# Patient Record
Sex: Female | Born: 1950
Health system: Southern US, Community
[De-identification: ages and names within clinical notes are randomized; demographics above are authoritative.]

## PROBLEM LIST (undated history)

## (undated) DIAGNOSIS — E785 Hyperlipidemia, unspecified: Secondary | ICD-10-CM

## (undated) DIAGNOSIS — C801 Malignant (primary) neoplasm, unspecified: Secondary | ICD-10-CM

## (undated) DIAGNOSIS — F32A Depression, unspecified: Secondary | ICD-10-CM

## (undated) DIAGNOSIS — M1990 Unspecified osteoarthritis, unspecified site: Secondary | ICD-10-CM

## (undated) DIAGNOSIS — F329 Major depressive disorder, single episode, unspecified: Secondary | ICD-10-CM

## (undated) DIAGNOSIS — M779 Enthesopathy, unspecified: Secondary | ICD-10-CM

## (undated) DIAGNOSIS — K219 Gastro-esophageal reflux disease without esophagitis: Secondary | ICD-10-CM

## (undated) HISTORY — DX: Major depressive disorder, single episode, unspecified: F32.9

## (undated) HISTORY — DX: Hyperlipidemia, unspecified: E78.5

## (undated) HISTORY — DX: Enthesopathy, unspecified: M77.9

## (undated) HISTORY — DX: Unspecified osteoarthritis, unspecified site: M19.90

## (undated) HISTORY — PX: BREAST SURGERY: SHX581

## (undated) HISTORY — DX: Gastro-esophageal reflux disease without esophagitis: K21.9

## (undated) HISTORY — DX: Depression, unspecified: F32.A

## (undated) HISTORY — PX: ELBOW SURGERY: SHX618

## (undated) HISTORY — DX: Malignant (primary) neoplasm, unspecified: C80.1

---

## 1998-08-26 ENCOUNTER — Ambulatory Visit (HOSPITAL_COMMUNITY): Admission: RE | Admit: 1998-08-26 | Discharge: 1998-08-27 | Payer: Self-pay | Admitting: General Surgery

## 1998-08-26 ENCOUNTER — Encounter: Payer: Self-pay | Admitting: General Surgery

## 1998-09-12 ENCOUNTER — Encounter (HOSPITAL_COMMUNITY): Admission: RE | Admit: 1998-09-12 | Discharge: 1998-12-11 | Payer: Self-pay | Admitting: Dentistry

## 1998-09-12 ENCOUNTER — Ambulatory Visit (HOSPITAL_COMMUNITY): Admission: RE | Admit: 1998-09-12 | Discharge: 1998-09-12 | Payer: Self-pay | Admitting: Hematology & Oncology

## 1998-09-17 ENCOUNTER — Ambulatory Visit (HOSPITAL_COMMUNITY): Admission: RE | Admit: 1998-09-17 | Discharge: 1998-09-17 | Payer: Self-pay | Admitting: Dentistry

## 1998-09-17 ENCOUNTER — Encounter (HOSPITAL_COMMUNITY): Payer: Self-pay | Admitting: Dentistry

## 1998-09-19 ENCOUNTER — Observation Stay (HOSPITAL_COMMUNITY): Admission: AD | Admit: 1998-09-19 | Discharge: 1998-09-20 | Payer: Self-pay | Admitting: Hematology & Oncology

## 1998-11-09 ENCOUNTER — Inpatient Hospital Stay (HOSPITAL_COMMUNITY): Admission: AD | Admit: 1998-11-09 | Discharge: 1998-11-10 | Payer: Self-pay | Admitting: Hematology & Oncology

## 1998-11-21 ENCOUNTER — Inpatient Hospital Stay (HOSPITAL_COMMUNITY): Admission: EM | Admit: 1998-11-21 | Discharge: 1998-11-25 | Payer: Self-pay | Admitting: Hematology & Oncology

## 1998-12-14 HISTORY — PX: BREAST SURGERY: SHX581

## 1998-12-25 ENCOUNTER — Encounter: Payer: Self-pay | Admitting: Hematology & Oncology

## 1998-12-25 ENCOUNTER — Ambulatory Visit (HOSPITAL_COMMUNITY): Admission: RE | Admit: 1998-12-25 | Discharge: 1998-12-25 | Payer: Self-pay | Admitting: Hematology & Oncology

## 1999-06-06 ENCOUNTER — Encounter: Payer: Self-pay | Admitting: Hematology & Oncology

## 1999-06-06 ENCOUNTER — Ambulatory Visit (HOSPITAL_COMMUNITY): Admission: RE | Admit: 1999-06-06 | Discharge: 1999-06-06 | Payer: Self-pay

## 1999-09-30 ENCOUNTER — Other Ambulatory Visit: Admission: RE | Admit: 1999-09-30 | Discharge: 1999-09-30 | Payer: Self-pay | Admitting: Family Medicine

## 2000-10-29 ENCOUNTER — Other Ambulatory Visit: Admission: RE | Admit: 2000-10-29 | Discharge: 2000-10-29 | Payer: Self-pay | Admitting: Family Medicine

## 2001-07-25 ENCOUNTER — Encounter: Payer: Self-pay | Admitting: Hematology & Oncology

## 2001-07-25 ENCOUNTER — Encounter: Admission: RE | Admit: 2001-07-25 | Discharge: 2001-07-25 | Payer: Self-pay | Admitting: Hematology & Oncology

## 2001-09-05 ENCOUNTER — Encounter (INDEPENDENT_AMBULATORY_CARE_PROVIDER_SITE_OTHER): Payer: Self-pay | Admitting: *Deleted

## 2002-03-23 ENCOUNTER — Encounter: Payer: Self-pay | Admitting: Hematology & Oncology

## 2002-03-23 ENCOUNTER — Encounter: Admission: RE | Admit: 2002-03-23 | Discharge: 2002-03-23 | Payer: Self-pay | Admitting: Hematology & Oncology

## 2002-06-19 ENCOUNTER — Encounter: Admission: RE | Admit: 2002-06-19 | Discharge: 2002-06-19 | Payer: Self-pay | Admitting: Hematology & Oncology

## 2002-06-19 ENCOUNTER — Encounter: Payer: Self-pay | Admitting: Hematology & Oncology

## 2003-01-12 ENCOUNTER — Encounter: Admission: RE | Admit: 2003-01-12 | Discharge: 2003-01-12 | Payer: Self-pay | Admitting: Hematology & Oncology

## 2003-01-12 ENCOUNTER — Encounter: Payer: Self-pay | Admitting: Hematology & Oncology

## 2004-04-11 ENCOUNTER — Encounter: Admission: RE | Admit: 2004-04-11 | Discharge: 2004-04-11 | Payer: Self-pay | Admitting: Family Medicine

## 2004-11-28 ENCOUNTER — Ambulatory Visit: Payer: Self-pay | Admitting: Family Medicine

## 2005-01-09 ENCOUNTER — Ambulatory Visit: Payer: Self-pay | Admitting: Hematology & Oncology

## 2005-07-09 ENCOUNTER — Ambulatory Visit: Payer: Self-pay | Admitting: Hematology & Oncology

## 2006-01-07 ENCOUNTER — Ambulatory Visit: Payer: Self-pay | Admitting: Hematology & Oncology

## 2006-04-14 ENCOUNTER — Encounter: Payer: Self-pay | Admitting: General Surgery

## 2006-04-14 ENCOUNTER — Encounter: Payer: Self-pay | Admitting: Hematology & Oncology

## 2006-07-05 ENCOUNTER — Ambulatory Visit: Payer: Self-pay | Admitting: Hematology & Oncology

## 2006-07-09 LAB — CBC WITH DIFFERENTIAL/PLATELET
BASO%: 0.2 % (ref 0.0–2.0)
Basophils Absolute: 0 10*3/uL (ref 0.0–0.1)
Eosinophils Absolute: 0.1 10*3/uL (ref 0.0–0.5)
HCT: 35.9 % (ref 34.8–46.6)
HGB: 12 g/dL (ref 11.6–15.9)
LYMPH%: 29.5 % (ref 14.0–48.0)
MONO#: 0.6 10*3/uL (ref 0.1–0.9)
NEUT#: 3.1 10*3/uL (ref 1.5–6.5)
NEUT%: 58 % (ref 39.6–76.8)
Platelets: 213 10*3/uL (ref 145–400)
WBC: 5.4 10*3/uL (ref 3.9–10.0)
lymph#: 1.6 10*3/uL (ref 0.9–3.3)

## 2006-07-09 LAB — T4: T4, Total: 6.3 ug/dL (ref 5.0–12.5)

## 2006-07-09 LAB — TSH: TSH: 0.94 u[IU]/mL (ref 0.350–5.500)

## 2006-07-09 LAB — COMPREHENSIVE METABOLIC PANEL
ALT: 39 U/L (ref 0–40)
CO2: 27 mEq/L (ref 19–32)
Calcium: 9.2 mg/dL (ref 8.4–10.5)
Chloride: 100 mEq/L (ref 96–112)
Creatinine, Ser: 0.86 mg/dL (ref 0.40–1.20)
Glucose, Bld: 85 mg/dL (ref 70–99)
Total Bilirubin: 0.4 mg/dL (ref 0.3–1.2)

## 2007-01-04 ENCOUNTER — Ambulatory Visit: Payer: Self-pay | Admitting: Hematology & Oncology

## 2007-01-20 LAB — COMPREHENSIVE METABOLIC PANEL
ALT: 34 U/L (ref 0–35)
AST: 23 U/L (ref 0–37)
Albumin: 4.5 g/dL (ref 3.5–5.2)
Alkaline Phosphatase: 97 U/L (ref 39–117)
Potassium: 4.3 mEq/L (ref 3.5–5.3)
Sodium: 140 mEq/L (ref 135–145)
Total Bilirubin: 0.4 mg/dL (ref 0.3–1.2)
Total Protein: 6.8 g/dL (ref 6.0–8.3)

## 2007-01-20 LAB — CBC WITH DIFFERENTIAL/PLATELET
BASO%: 0.2 % (ref 0.0–2.0)
LYMPH%: 23.6 % (ref 14.0–48.0)
MCHC: 34.8 g/dL (ref 32.0–36.0)
MCV: 85.1 fL (ref 81.0–101.0)
MONO#: 0.5 10*3/uL (ref 0.1–0.9)
MONO%: 7.9 % (ref 0.0–13.0)
NEUT#: 4.6 10*3/uL (ref 1.5–6.5)
Platelets: 210 10*3/uL (ref 145–400)
RBC: 4.51 10*6/uL (ref 3.70–5.32)
RDW: 14.4 % (ref 11.3–14.5)
WBC: 6.9 10*3/uL (ref 3.9–10.0)

## 2007-07-20 ENCOUNTER — Ambulatory Visit: Payer: Self-pay | Admitting: Hematology & Oncology

## 2007-07-22 LAB — CBC WITH DIFFERENTIAL/PLATELET
BASO%: 0.3 % (ref 0.0–2.0)
EOS%: 0.9 % (ref 0.0–7.0)
Eosinophils Absolute: 0.1 10*3/uL (ref 0.0–0.5)
LYMPH%: 22.5 % (ref 14.0–48.0)
MCH: 29.5 pg (ref 26.0–34.0)
MCHC: 35.2 g/dL (ref 32.0–36.0)
MCV: 83.8 fL (ref 81.0–101.0)
MONO%: 10 % (ref 0.0–13.0)
Platelets: 187 10*3/uL (ref 145–400)
RBC: 4.2 10*6/uL (ref 3.70–5.32)

## 2007-07-22 LAB — COMPREHENSIVE METABOLIC PANEL
AST: 19 U/L (ref 0–37)
Alkaline Phosphatase: 94 U/L (ref 39–117)
Glucose, Bld: 94 mg/dL (ref 70–99)
Sodium: 140 mEq/L (ref 135–145)
Total Bilirubin: 0.5 mg/dL (ref 0.3–1.2)
Total Protein: 6.4 g/dL (ref 6.0–8.3)

## 2007-08-26 DIAGNOSIS — Z853 Personal history of malignant neoplasm of breast: Secondary | ICD-10-CM

## 2007-08-26 DIAGNOSIS — J309 Allergic rhinitis, unspecified: Secondary | ICD-10-CM | POA: Insufficient documentation

## 2008-01-25 ENCOUNTER — Ambulatory Visit: Payer: Self-pay | Admitting: Hematology & Oncology

## 2008-01-27 LAB — CBC WITH DIFFERENTIAL/PLATELET
BASO%: 0.1 % (ref 0.0–2.0)
Basophils Absolute: 0 10*3/uL (ref 0.0–0.1)
HCT: 39.2 % (ref 34.8–46.6)
HGB: 12.6 g/dL (ref 11.6–15.9)
LYMPH%: 32.1 % (ref 14.0–48.0)
MCHC: 32.2 g/dL (ref 32.0–36.0)
MONO#: 0.5 10*3/uL (ref 0.1–0.9)
NEUT%: 58 % (ref 39.6–76.8)
Platelets: 240 10*3/uL (ref 145–400)
WBC: 6.3 10*3/uL (ref 3.9–10.0)
lymph#: 2 10*3/uL (ref 0.9–3.3)

## 2008-01-27 LAB — COMPREHENSIVE METABOLIC PANEL
ALT: 17 U/L (ref 0–35)
BUN: 9 mg/dL (ref 6–23)
CO2: 29 mEq/L (ref 19–32)
Calcium: 9.6 mg/dL (ref 8.4–10.5)
Chloride: 103 mEq/L (ref 96–112)
Creatinine, Ser: 0.79 mg/dL (ref 0.40–1.20)
Glucose, Bld: 107 mg/dL — ABNORMAL HIGH (ref 70–99)
Total Bilirubin: 0.3 mg/dL (ref 0.3–1.2)

## 2008-04-08 ENCOUNTER — Emergency Department (HOSPITAL_COMMUNITY): Admission: EM | Admit: 2008-04-08 | Discharge: 2008-04-08 | Payer: Self-pay | Admitting: Family Medicine

## 2008-07-18 ENCOUNTER — Ambulatory Visit: Payer: Self-pay | Admitting: Hematology & Oncology

## 2008-07-20 LAB — CBC WITH DIFFERENTIAL (CANCER CENTER ONLY)
BASO%: 0.4 % (ref 0.0–2.0)
LYMPH#: 1.7 10*3/uL (ref 0.9–3.3)
LYMPH%: 32.5 % (ref 14.0–48.0)
MONO#: 0.4 10*3/uL (ref 0.1–0.9)
Platelets: 214 10*3/uL (ref 145–400)
RDW: 12.8 % (ref 10.5–14.6)
WBC: 5.3 10*3/uL (ref 3.9–10.0)

## 2008-07-20 LAB — COMPREHENSIVE METABOLIC PANEL
ALT: 19 U/L (ref 0–35)
AST: 21 U/L (ref 0–37)
BUN: 13 mg/dL (ref 6–23)
Creatinine, Ser: 0.9 mg/dL (ref 0.40–1.20)
Total Bilirubin: 0.4 mg/dL (ref 0.3–1.2)

## 2009-02-01 ENCOUNTER — Ambulatory Visit: Payer: Self-pay | Admitting: Family Medicine

## 2009-02-01 DIAGNOSIS — F418 Other specified anxiety disorders: Secondary | ICD-10-CM | POA: Insufficient documentation

## 2009-02-07 ENCOUNTER — Ambulatory Visit: Payer: Self-pay | Admitting: Hematology & Oncology

## 2009-02-08 LAB — COMPREHENSIVE METABOLIC PANEL
ALT: 24 U/L (ref 0–35)
AST: 21 U/L (ref 0–37)
Albumin: 5 g/dL (ref 3.5–5.2)
Alkaline Phosphatase: 96 U/L (ref 39–117)
Calcium: 9.8 mg/dL (ref 8.4–10.5)
Chloride: 97 mEq/L (ref 96–112)
Potassium: 3.8 mEq/L (ref 3.5–5.3)
Sodium: 135 mEq/L (ref 135–145)

## 2009-02-08 LAB — CBC WITH DIFFERENTIAL (CANCER CENTER ONLY)
BASO%: 0.6 % (ref 0.0–2.0)
EOS%: 2.1 % (ref 0.0–7.0)
LYMPH%: 28 % (ref 14.0–48.0)
MCH: 28.4 pg (ref 26.0–34.0)
MCV: 88 fL (ref 81–101)
MONO%: 6.8 % (ref 0.0–13.0)
Platelets: 221 10*3/uL (ref 145–400)
RDW: 12 % (ref 10.5–14.6)
WBC: 6.5 10*3/uL (ref 3.9–10.0)

## 2009-07-25 ENCOUNTER — Ambulatory Visit: Payer: Self-pay | Admitting: Hematology & Oncology

## 2009-07-26 LAB — COMPREHENSIVE METABOLIC PANEL
Albumin: 4.4 g/dL (ref 3.5–5.2)
BUN: 9 mg/dL (ref 6–23)
CO2: 24 mEq/L (ref 19–32)
Calcium: 9.4 mg/dL (ref 8.4–10.5)
Chloride: 103 mEq/L (ref 96–112)
Glucose, Bld: 86 mg/dL (ref 70–99)
Potassium: 4.1 mEq/L (ref 3.5–5.3)

## 2009-07-26 LAB — CBC WITH DIFFERENTIAL (CANCER CENTER ONLY)
BASO#: 0 10*3/uL (ref 0.0–0.2)
Eosinophils Absolute: 0.1 10*3/uL (ref 0.0–0.5)
LYMPH%: 34 % (ref 14.0–48.0)
MCH: 27.7 pg (ref 26.0–34.0)
MCV: 84 fL (ref 81–101)
MONO%: 6.6 % (ref 0.0–13.0)
Platelets: 205 10*3/uL (ref 145–400)
RBC: 4.4 10*6/uL (ref 3.70–5.32)

## 2009-10-17 ENCOUNTER — Ambulatory Visit: Payer: Self-pay | Admitting: Hematology & Oncology

## 2009-11-19 ENCOUNTER — Ambulatory Visit: Payer: Self-pay | Admitting: Family Medicine

## 2009-11-19 DIAGNOSIS — G43909 Migraine, unspecified, not intractable, without status migrainosus: Secondary | ICD-10-CM

## 2010-01-15 ENCOUNTER — Ambulatory Visit: Payer: Self-pay | Admitting: Hematology & Oncology

## 2010-01-16 LAB — CBC WITH DIFFERENTIAL (CANCER CENTER ONLY)
BASO#: 0 10*3/uL (ref 0.0–0.2)
BASO%: 0.5 % (ref 0.0–2.0)
EOS%: 1.7 % (ref 0.0–7.0)
Eosinophils Absolute: 0.1 10*3/uL (ref 0.0–0.5)
HCT: 40.5 % (ref 34.8–46.6)
HGB: 13.3 g/dL (ref 11.6–15.9)
LYMPH#: 2.1 10*3/uL (ref 0.9–3.3)
LYMPH%: 31.5 % (ref 14.0–48.0)
MCH: 27.8 pg (ref 26.0–34.0)
MCHC: 33 g/dL (ref 32.0–36.0)
MCV: 84 fL (ref 81–101)
MONO#: 0.4 10*3/uL (ref 0.1–0.9)
MONO%: 6.7 % (ref 0.0–13.0)
NEUT#: 3.9 10*3/uL (ref 1.5–6.5)
NEUT%: 59.6 % (ref 39.6–80.0)
Platelets: 232 10*3/uL (ref 145–400)
RBC: 4.8 10*6/uL (ref 3.70–5.32)
RDW: 12.7 % (ref 10.5–14.6)
WBC: 6.6 10*3/uL (ref 3.9–10.0)

## 2010-04-14 ENCOUNTER — Emergency Department (HOSPITAL_COMMUNITY): Admission: EM | Admit: 2010-04-14 | Discharge: 2010-04-14 | Payer: Self-pay | Admitting: Family Medicine

## 2010-07-18 ENCOUNTER — Emergency Department (HOSPITAL_COMMUNITY): Admission: EM | Admit: 2010-07-18 | Discharge: 2010-07-18 | Payer: Self-pay | Admitting: Emergency Medicine

## 2010-07-23 ENCOUNTER — Ambulatory Visit: Payer: Self-pay | Admitting: Hematology & Oncology

## 2010-08-12 ENCOUNTER — Emergency Department (HOSPITAL_COMMUNITY): Admission: EM | Admit: 2010-08-12 | Discharge: 2010-08-12 | Payer: Self-pay | Admitting: Family Medicine

## 2011-01-12 ENCOUNTER — Emergency Department (HOSPITAL_COMMUNITY)
Admission: EM | Admit: 2011-01-12 | Discharge: 2011-01-12 | Payer: Self-pay | Source: Home / Self Care | Admitting: Family Medicine

## 2011-01-12 LAB — POCT URINALYSIS DIPSTICK
Ketones, ur: NEGATIVE mg/dL
Nitrite: NEGATIVE
Protein, ur: NEGATIVE mg/dL
Specific Gravity, Urine: 1.01 (ref 1.005–1.030)
Urine Glucose, Fasting: NEGATIVE mg/dL
Urobilinogen, UA: 0.2 mg/dL (ref 0.0–1.0)
pH: 7.5 (ref 5.0–8.0)

## 2011-02-04 ENCOUNTER — Encounter: Payer: Self-pay | Admitting: Family Medicine

## 2011-02-04 ENCOUNTER — Ambulatory Visit (INDEPENDENT_AMBULATORY_CARE_PROVIDER_SITE_OTHER): Payer: PRIVATE HEALTH INSURANCE | Admitting: Family Medicine

## 2011-02-04 DIAGNOSIS — F329 Major depressive disorder, single episode, unspecified: Secondary | ICD-10-CM

## 2011-02-04 DIAGNOSIS — R0602 Shortness of breath: Secondary | ICD-10-CM

## 2011-02-04 NOTE — Progress Notes (Signed)
  Subjective:    Patient ID: Cindy [name], female    DOB: [birth year], 60 y.o.   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  There is a lot going on at her place of employment.  She is doing it with it and a very positive way by going to the gym 4 nights a week after work.  She states she is sleeping well, however, this morning, when she got up she felt short of breath.  No chest pain, etc., are she is a Surveyor, mining.  She had her follow-up by the oncologist this past summer.  As noted previously.  She had a left lumpectomy with follow-up radiation.  Annual mammography is her normal period.  She is not doing a thorough breast exam monthly.  Encouraged her to check her breasts monthly,,,,,,,,,,,,,The cancer was treated 13 years ago   Review of Systems Cardiopulmonary review of systems negative    Objective:   Physical Exam Well-developed well-nourished, female, in no acute distress   Cardiopulmonary exam negative.  Breast exam.  Scar left breast at 12 o'clock position from previous lumpectomy.  Tattoos from radiation ports.  No palpable masses    Assessment & Plan:  Shortness of breath, secondary to Job stress.  Continue the current medications take the Klonopin one tablet twice daily.  Return one week for follow-up.  Continue your exercise program

## 2011-02-04 NOTE — Patient Instructions (Signed)
Take the Klonopin one tablet twice daily.  Continue your other medications.  Continue your exercise program.  Follow-up in one week

## 2011-02-11 ENCOUNTER — Ambulatory Visit (INDEPENDENT_AMBULATORY_CARE_PROVIDER_SITE_OTHER): Payer: PRIVATE HEALTH INSURANCE | Admitting: Family Medicine

## 2011-02-11 ENCOUNTER — Encounter: Payer: Self-pay | Admitting: Family Medicine

## 2011-02-11 VITALS — BP 120/84 | Temp 98.4°F | Wt 182.0 lb

## 2011-02-11 DIAGNOSIS — F329 Major depressive disorder, single episode, unspecified: Secondary | ICD-10-CM

## 2011-02-11 NOTE — Patient Instructions (Signed)
Continue your current treatment program, except leave work at 530 every day, and go, exercise.  Return in May for general physical exam

## 2011-02-11 NOTE — Progress Notes (Signed)
  Subjective:    Patient ID: Cindy [name], female    DOB: 06-Sep-1951, 60 y.o.   MRN: [id number]  HPI Inita is a 60 year old, married female, nonsmoker, who comes in today for follow-up of work place.  Stress.  We saw her week ago with issues related to stress and chest pain and shortness of breath.  Cardiopulmonary-wise, she was fine.  EKG was normal.  With those symptoms were stress related.  We asked her to continue the Wellbutrin and take her Klonopin p.r.n., which she has done and she feels much better.  She continues to go to the gym 4 x  week.  However, she is not going to 7 o'clock at night and then has difficulty sleeping.  Suggestion leave work at 530   Review of Systems    Negative Objective:   Physical Exam    Well-developed well-nourished, female, in no acute distress.  Thought process organized not tearful    Assessment & Plan:  Depression improved,,,,,,,,,,,, plan continue current treatment program, except leave work at 530 and go exercise

## 2011-02-20 ENCOUNTER — Encounter (HOSPITAL_BASED_OUTPATIENT_CLINIC_OR_DEPARTMENT_OTHER): Payer: PRIVATE HEALTH INSURANCE | Admitting: Hematology & Oncology

## 2011-02-20 DIAGNOSIS — Z853 Personal history of malignant neoplasm of breast: Secondary | ICD-10-CM

## 2011-02-24 ENCOUNTER — Telehealth: Payer: Self-pay | Admitting: *Deleted

## 2011-02-24 MED ORDER — HYDROCODONE-HOMATROPINE 5-1.5 MG/5ML PO SYRP: 2.5000 mL | ORAL_SOLUTION | Freq: Three times a day (TID) | ORAL | Status: AC

## 2011-02-24 NOTE — Telephone Encounter (Signed)
Out of work yesterday and today.  Cough, congestion, aching all over, no fever.  Taking Tylenol and Muccinex. Wants to know what else she can take. Pt does not want to come in for appt requesting advise.  Pharmacy is [facility] [address]

## 2011-02-24 NOTE — Telephone Encounter (Signed)
Hydromet dispense 8 ounces directions one half to 1 teaspoon t.i.d. P.r.n. Cough one refill

## 2011-02-25 ENCOUNTER — Telehealth: Payer: Self-pay | Admitting: *Deleted

## 2011-02-25 ENCOUNTER — Ambulatory Visit: Payer: PRIVATE HEALTH INSURANCE | Admitting: Family Medicine

## 2011-02-25 DIAGNOSIS — R05 Cough: Secondary | ICD-10-CM

## 2011-02-25 NOTE — Telephone Encounter (Signed)
Pt is concerned about pneumonia, and would like a appt and chest xray. Both scheduled, and will see Dr Caryl Never.

## 2011-02-26 ENCOUNTER — Telehealth: Payer: Self-pay | Admitting: Gastroenterology

## 2011-02-26 ENCOUNTER — Ambulatory Visit (INDEPENDENT_AMBULATORY_CARE_PROVIDER_SITE_OTHER)
Admission: RE | Admit: 2011-02-26 | Discharge: 2011-02-26 | Disposition: A | Discharge status: Home / Self Care | Payer: PRIVATE HEALTH INSURANCE | Source: Ambulatory Visit

## 2011-02-26 DIAGNOSIS — R05 Cough: Secondary | ICD-10-CM

## 2011-02-27 ENCOUNTER — Telehealth: Payer: Self-pay | Admitting: Internal Medicine

## 2011-02-27 LAB — POCT URINALYSIS DIPSTICK
Ketones, ur: NEGATIVE mg/dL
Protein, ur: NEGATIVE mg/dL
Urobilinogen, UA: 0.2 mg/dL (ref 0.0–1.0)
pH: 5.5 (ref 5.0–8.0)

## 2011-02-27 NOTE — Telephone Encounter (Signed)
Chest x-ray is normal. Please inform patient

## 2011-02-27 NOTE — Telephone Encounter (Signed)
Pt would like to know her chest xray test results today

## 2011-02-27 NOTE — Telephone Encounter (Signed)
Spoke with pt - informed xr normal

## 2011-03-03 LAB — POCT URINALYSIS DIP (DEVICE)
Glucose, UA: NEGATIVE mg/dL
Nitrite: NEGATIVE
Specific Gravity, Urine: <=1.005 (ref 1.005–1.030)
Urobilinogen, UA: 0.2 mg/dL (ref 0.0–1.0)

## 2011-03-03 LAB — URINE CULTURE

## 2011-03-03 NOTE — Procedures (Signed)
Summary: Colon  Patient Name: Cindy [name], Cindy [name] MRN:  Procedure Procedures: Colonoscopy CPT: [id number].  Personnel: Endoscopist: Venita Lick. Russella Dar, MD, Clementeen Graham.  Referred By: Arlan Organ, MD.  Exam Location: Exam performed in Outpatient Clinic. Outpatient  Patient Consent: Procedure, Alternatives, Risks and Benefits discussed, consent obtained, from patient.  Indications  Increased Risk Screening: Personal history of breast cancer. For family history of colorectal neoplasia, in  first cousin  Comments: First cousin with colon ca at age 17. History  Pre-Exam Physical: Performed Sep 05, 2001. Cardio-pulmonary exam, Rectal exam, HEENT exam , Abdominal exam, Extremity exam, Neurological exam, Mental status exam WNL.  Exam Exam: Extent of exam reached: Cecum, extent intended: Cecum.  The cecum was identified by appendiceal orifice and IC valve. Colon retroflexion performed. ASA Classification: II. Tolerance: good.  Monitoring: Pulse and BP monitoring, Oximetry used. Supplemental O2 given.  Colon Prep Used Visicol for colon prep. Prep results: good.  Sedation Meds: Patient assessed and found to be appropriate for moderate (conscious) sedation. Fentanyl 100 mcg. Versed 10 mg.  Findings NORMAL EXAM: Entire Colon.   Assessment Normal examination.  Events  Unplanned Interventions: No intervention was required.  Unplanned Events: There were no complications. Plans Medication Plan: Continue current medications.  Disposition: After procedure patient sent to recovery. After recovery patient sent home.  Scheduling/Referral: Colonoscopy, to [facility] T. Russella Dar, MD, [facility], around Sep 05, 2006.  Referring provider, to Arlan Organ, MD, Sep 06, 2001.  CC:  Dr. Arlan Organ

## 2011-03-12 NOTE — Progress Notes (Signed)
Summary: Questions  Phone Note Call from Patient Call back at [phone]   Caller: Patient Call For: Dr. Marina Goodell Reason for Call: Talk to Nurse Summary of Call: patient is calling to check on when she is due again for her colon, there are no records in my computer for a recall so she wants to speak with a nurse about this, says that she had a colon at age 60 with Dr. Marina Goodell Initial call taken by: [place] Johnson,  February 26, 2011 2:52 PM  Follow-up for Phone Call        Pulled report and spoke with patient. Patient actually had procedure by Dr. Russella Dar. Informed patient that I would send this note to Dr. Ardell Isaacs nurse for follow-up. Follow-up by: Selinda Michaels RN,  February 26, 2011 4:03 PM  Additional Follow-up for Phone Call Additional follow up Details #1::        Dr Russella Dar please reveiw the colon in her chart and advise colon recall Additional Follow-up by: Darcey Nora RN, CGRN,  February 27, 2011 2:25 PM    Additional Follow-up for Phone Call Additional follow up Details #2::    Colon in [facility] is from 08/2001. Is there another colonoscopy? We recommended a colonoscopy at 5 years, 08/2006, but the current guidelines for a cousin with colon cancer would be 10 years, 08/2011. Follow-up by: Meryl Dare MD Clementeen Graham,  March 01, 2011 12:00 AM  Additional Follow-up for Phone Call Additional follow up Details #3:: Details for Additional Follow-up Action Taken: Checked for another colon report and cannot find one other than from 2002. Left message for patient that she is due in Sept and entered recall in the computer.  Additional Follow-up by: Selinda Michaels RN,  March 02, 2011 8:48 AM

## 2011-04-30 ENCOUNTER — Encounter: Payer: PRIVATE HEALTH INSURANCE | Admitting: Family Medicine

## 2011-09-08 LAB — POCT URINALYSIS DIP (DEVICE)
Bilirubin Urine: NEGATIVE
Ketones, ur: NEGATIVE
Nitrite: NEGATIVE
Operator id: 235561
Protein, ur: NEGATIVE

## 2011-09-08 LAB — URINE CULTURE: Colony Count: 25000

## 2011-09-25 ENCOUNTER — Encounter: Payer: Self-pay | Admitting: Gastroenterology

## 2011-10-05 ENCOUNTER — Telehealth: Payer: Self-pay | Admitting: *Deleted

## 2011-10-05 NOTE — Telephone Encounter (Signed)
patient  Is calling because she had dental work last week.  She was given a rx for amoxicillin.  She is now having some burning when she urinates.  She would like to know if we can call something in?  Work number [phone] if she is not there she would liked to be paged.

## 2011-10-05 NOTE — Telephone Encounter (Signed)
Kimela was treated by the nurse practitioner today at the office.  Her symptoms of the tract infection with burning and frequency.  Culture was done.  She was given Septra DS 10 tablets.  Advised to call me on Thursday with the culture report

## 2011-10-09 ENCOUNTER — Other Ambulatory Visit: Payer: Self-pay | Admitting: *Deleted

## 2011-10-09 MED ORDER — SULFAMETHOXAZOLE-TMP DS 800-160 MG PO TABS: 1.0000 | ORAL_TABLET | Freq: Two times a day (BID) | ORAL | Status: DC

## 2011-10-19 ENCOUNTER — Encounter: Payer: Self-pay | Admitting: Family Medicine

## 2011-10-19 ENCOUNTER — Ambulatory Visit (INDEPENDENT_AMBULATORY_CARE_PROVIDER_SITE_OTHER): Payer: PRIVATE HEALTH INSURANCE | Admitting: Family Medicine

## 2011-10-19 ENCOUNTER — Other Ambulatory Visit (HOSPITAL_COMMUNITY)
Admission: RE | Admit: 2011-10-19 | Discharge: 2011-10-19 | Disposition: A | Discharge status: Home / Self Care | Payer: PRIVATE HEALTH INSURANCE | Source: Ambulatory Visit | Attending: Family Medicine | Admitting: Family Medicine

## 2011-10-19 DIAGNOSIS — Z0000 Encounter for general adult medical examination without abnormal findings: Secondary | ICD-10-CM

## 2011-10-19 DIAGNOSIS — J309 Allergic rhinitis, unspecified: Secondary | ICD-10-CM

## 2011-10-19 DIAGNOSIS — F329 Major depressive disorder, single episode, unspecified: Secondary | ICD-10-CM

## 2011-10-19 DIAGNOSIS — E785 Hyperlipidemia, unspecified: Secondary | ICD-10-CM

## 2011-10-19 DIAGNOSIS — Z853 Personal history of malignant neoplasm of breast: Secondary | ICD-10-CM

## 2011-10-19 DIAGNOSIS — Z01419 Encounter for gynecological examination (general) (routine) without abnormal findings: Secondary | ICD-10-CM | POA: Insufficient documentation

## 2011-10-19 LAB — LIPID PANEL
Cholesterol: 196 mg/dL (ref 0–200)
HDL: 77.7 mg/dL (ref >39.00)
LDL Cholesterol: 109 mg/dL — ABNORMAL HIGH (ref 0–99)
Total CHOL/HDL Ratio: 3
Triglycerides: 49 mg/dL (ref 0.0–149.0)
VLDL: 9.8 mg/dL (ref 0.0–40.0)

## 2011-10-19 LAB — CBC WITH DIFFERENTIAL/PLATELET
Basophils Absolute: 0 10*3/uL (ref 0.0–0.1)
Basophils Relative: 0.4 % (ref 0.0–3.0)
Eosinophils Absolute: 0 10*3/uL (ref 0.0–0.7)
Eosinophils Relative: 0.7 % (ref 0.0–5.0)
HCT: 37.3 % (ref 36.0–46.0)
Hemoglobin: 12.5 g/dL (ref 12.0–15.0)
Lymphocytes Relative: 37.3 % (ref 12.0–46.0)
Lymphs Abs: 2.2 10*3/uL (ref 0.7–4.0)
MCHC: 33.5 g/dL (ref 30.0–36.0)
MCV: 86.3 fl (ref 78.0–100.0)
Monocytes Absolute: 0.5 10*3/uL (ref 0.1–1.0)
Monocytes Relative: 7.9 % (ref 3.0–12.0)
Neutro Abs: 3.1 10*3/uL (ref 1.4–7.7)
Neutrophils Relative %: 53.7 % (ref 43.0–77.0)
Platelets: 192 10*3/uL (ref 150.0–400.0)
RBC: 4.33 Mil/uL (ref 3.87–5.11)
RDW: 15.1 % — ABNORMAL HIGH (ref 11.5–14.6)
WBC: 5.8 10*3/uL (ref 4.5–10.5)

## 2011-10-19 LAB — HEPATIC FUNCTION PANEL
ALT: 24 U/L (ref 0–35)
Albumin: 4.2 g/dL (ref 3.5–5.2)
Total Bilirubin: 0.4 mg/dL (ref 0.3–1.2)

## 2011-10-19 LAB — POCT URINALYSIS DIPSTICK
Bilirubin, UA: NEGATIVE
Glucose, UA: NEGATIVE
Ketones, UA: NEGATIVE
Leukocytes, UA: NEGATIVE
Nitrite, UA: NEGATIVE
pH, UA: 7

## 2011-10-19 LAB — TSH: TSH: 1 u[IU]/mL (ref 0.35–5.50)

## 2011-10-19 MED ORDER — BUPROPION HCL ER (XL) 300 MG PO TB24: 300.0000 mg | ORAL_TABLET | Freq: Every day | ORAL | Status: DC

## 2011-10-19 MED ORDER — TRIAMCINOLONE ACETONIDE 0.5 % EX OINT: TOPICAL_OINTMENT | Freq: Two times a day (BID) | CUTANEOUS | Status: AC

## 2011-10-19 MED ORDER — CLONAZEPAM 1 MG PO TABS: 1.0000 mg | ORAL_TABLET | Freq: Every day | ORAL | Status: DC

## 2011-10-19 MED ORDER — BUPROPION HCL ER (SR) 150 MG PO TB12: 150.0000 mg | ORAL_TABLET | Freq: Every day | ORAL | Status: DC

## 2011-10-19 MED ORDER — PAROXETINE HCL 10 MG PO TABS: 10.0000 mg | ORAL_TABLET | Freq: Every day | ORAL | Status: DC

## 2011-10-19 MED ORDER — LOVASTATIN 20 MG PO TABS: 20.0000 mg | ORAL_TABLET | Freq: Every day | ORAL | Status: DC

## 2011-10-19 NOTE — Patient Instructions (Signed)
Tried small amounts of the udder  Cream In combination with the steroid gel for the skin rash, and wear white cotton gloves at bedtime, and take 25 mg of Benadryl at bedtime.  Also decrease the temperature.  Continue your other medications.  See Dr. Vonna Kotyk.  This fall for an eye exam.  Return in one year or sooner if any problems

## 2011-10-19 NOTE — Progress Notes (Signed)
Subjective:    Patient ID: Cindy [name], female    DOB: 1950/12/26, 60 y.o.   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  She takes Wellbutrin 450 mg a day for mild depression.  She also takes Klonopin 1 mg at bedtime p.r.n. For sleep.  She takes Prevacid 15 mg OTC b.i.d. For reflux esophagitis.  She takes Mevacor 20 mg nightly for hyperlipidemia.  Will check lipid panel today.  She also takes 10 mg of Paxil at that time.  She has a history of, neurodermatitis.  She's been to see the dermatologist and was given a steroid ointment.  However, she continues to scratch and itch.  It seems to be much worse in the winter when it gets dry.  She gets routine eye care.  Recommend Dr. Vonna Kotyk, regular dental care, BSE monthly, annual mammography, colonoscopy, follow-up November 2012, previous bone density, unchanged off of Fosamax.  She takes a small amount of calcium, vitamin D daily.  She sees Dr. Demetrius Charity. I. On a yearly basis for oncology follow-up.  Recently had an E. Coli UTI, which resolved with Septra.   Review of Systems  Constitutional: Negative.   HENT: Negative.   Eyes: Negative.   Respiratory: Negative.   Cardiovascular: Negative.   Gastrointestinal: Negative.   Genitourinary: Negative.   Musculoskeletal: Negative.   Neurological: Negative.   Hematological: Negative.   Psychiatric/Behavioral: Negative.        Objective:   Physical Exam  Constitutional: She appears well-developed and well-nourished.  HENT:  Head: Normocephalic and atraumatic.  Right Ear: External ear normal.  Left Ear: External ear normal.  Nose: Nose normal.  Mouth/Throat: Oropharynx is clear and moist.  Eyes: EOM are normal.  Pupils are equal, round, and reactive to light.  Neck: Normal range of motion. Neck supple. No thyromegaly present.  Cardiovascular: Normal rate, regular rhythm, normal heart sounds and intact distal pulses.  Exam reveals no gallop and no friction rub.   No murmur heard. Pulmonary/Chest: Effort normal and breath sounds normal.  Abdominal: Soft. Bowel sounds are normal. She exhibits no distension and no mass. There is no tenderness. There is no rebound.  Genitourinary: Vagina normal and uterus normal. Guaiac negative stool. No vaginal discharge found.       Bilateral breast exam shows a scar in the 12 o'clock position above the left breast from previous Port-A-Cath.  Also scar right breast 7 o'clock position from previous lumpectomy and tattoos from previous radiation .  No palpable masses.  However, she does have diffuse fibrocystic changes throughout both breasts.  Musculoskeletal: Normal range of motion.  Lymphadenopathy:    She has no cervical adenopathy.  Neurological: She is alert. She has normal reflexes. No cranial nerve deficit. She exhibits normal muscle tone. Coordination normal.  Skin: Skin is warm and dry.       Evidence of multiple areas of scratching and itching  Psychiatric: She has a normal mood and affect. Her behavior is normal. Judgment and thought content normal.          Assessment & Plan:  Healthy female.  History of depression.  Continue current medication.  Reflux esophagitis.  Continue Prevacid 15 mg b.i.d.  History of hyperlipidemia.  Continue Mevacor 20 mg nightly check  lipid panel today.  No dermatitis combination of steroid cream,udder cream and 25 mg of Benadryl at bedtime.  Return in one year or sooner if any problems.

## 2011-10-20 LAB — BASIC METABOLIC PANEL
CO2: 27 mEq/L (ref 19–32)
Chloride: 104 mEq/L (ref 96–112)
Creatinine, Ser: 1 mg/dL (ref 0.4–1.2)
Potassium: 4.5 mEq/L (ref 3.5–5.1)
Sodium: 139 mEq/L (ref 135–145)

## 2011-10-20 NOTE — Progress Notes (Signed)
Addended by: Rita Ohara R on: 10/20/2011 10:25 AM   Modules accepted: Orders

## 2011-10-27 NOTE — Progress Notes (Signed)
Quick Note:  Pt husband informed, mailed labs to pt home ______

## 2011-10-27 NOTE — Progress Notes (Signed)
Quick Note:  Pt husband informed, mailed to home ______

## 2011-10-28 ENCOUNTER — Other Ambulatory Visit: Payer: PRIVATE HEALTH INSURANCE | Admitting: Gastroenterology

## 2011-11-03 ENCOUNTER — Ambulatory Visit (AMBULATORY_SURGERY_CENTER): Payer: PRIVATE HEALTH INSURANCE | Admitting: *Deleted

## 2011-11-03 VITALS — Ht 67.5 in | Wt 185.0 lb

## 2011-11-03 DIAGNOSIS — Z1211 Encounter for screening for malignant neoplasm of colon: Secondary | ICD-10-CM

## 2011-11-03 MED ORDER — PEG-KCL-NACL-NASULF-NA ASC-C 100 G PO SOLR: ORAL | Status: DC

## 2011-11-17 ENCOUNTER — Encounter: Payer: Self-pay | Admitting: Gastroenterology

## 2011-11-17 ENCOUNTER — Ambulatory Visit (AMBULATORY_SURGERY_CENTER): Payer: PRIVATE HEALTH INSURANCE | Admitting: Gastroenterology

## 2011-11-17 VITALS — BP 109/63 | HR 66 | Temp 98.4°F | Resp 18 | Ht 67.0 in | Wt 185.0 lb

## 2011-11-17 DIAGNOSIS — Z1211 Encounter for screening for malignant neoplasm of colon: Secondary | ICD-10-CM

## 2011-11-17 MED ORDER — SODIUM CHLORIDE 0.9 % IV SOLN: 500.0000 mL | INTRAVENOUS | Status: DC

## 2011-11-17 NOTE — Progress Notes (Signed)
Patient did not experience any of the following events: a burn prior to discharge; a fall within the facility; wrong site/side/patient/procedure/implant event; or a hospital transfer or hospital admission upon discharge from the facility. (G8907) Patient did not have preoperative order for IV antibiotic SSI prophylaxis. (G8918)  

## 2011-11-17 NOTE — Patient Instructions (Signed)
Please refer to your blue and neon green sheets for instructions regarding diet and activity for the rest of today.  You may resume your medications as you would normally take them.  

## 2011-11-18 ENCOUNTER — Telehealth: Payer: Self-pay | Admitting: *Deleted

## 2011-11-18 NOTE — Telephone Encounter (Signed)

## 2012-03-22 ENCOUNTER — Telehealth: Payer: Self-pay | Admitting: *Deleted

## 2012-03-22 NOTE — Telephone Encounter (Signed)
Patient is calling to let Dr Tawanna Cooler know that she has referred a couple of co-workers to him.  Also she would like to know if she can have a prescription for an appetite suppressant?

## 2012-04-11 ENCOUNTER — Telehealth: Payer: Self-pay | Admitting: Family Medicine

## 2012-04-11 NOTE — Telephone Encounter (Signed)
Pt states she is having lightheaded and her BP 126/76, has lost 3 lbs in the last few weeks, but is on a diet.  Takes changed pharmacies and some different meds (as generic Wellbutrin).  No fainting spells or reason to come ASAP.

## 2012-04-11 NOTE — Telephone Encounter (Signed)
Patient called stating that she would like to see Dr. Tawanna Cooler tomorrow for light headedness and I called triage to access the issue. Please assist. If patient can not be reached on her cell phone number provided, she can be reached at her desk as follows. [phone]

## 2012-04-12 ENCOUNTER — Encounter: Payer: Self-pay | Admitting: Family Medicine

## 2012-04-12 ENCOUNTER — Ambulatory Visit (INDEPENDENT_AMBULATORY_CARE_PROVIDER_SITE_OTHER): Payer: 59 | Admitting: Family Medicine

## 2012-04-12 VITALS — BP 130/98 | Temp 98.1°F | Wt 188.0 lb

## 2012-04-12 DIAGNOSIS — F329 Major depressive disorder, single episode, unspecified: Secondary | ICD-10-CM

## 2012-04-12 DIAGNOSIS — R42 Dizziness and giddiness: Secondary | ICD-10-CM

## 2012-04-12 DIAGNOSIS — R030 Elevated blood-pressure reading, without diagnosis of hypertension: Secondary | ICD-10-CM

## 2012-04-12 LAB — T4, FREE: Free T4: 0.84 ng/dL (ref 0.60–1.60)

## 2012-04-12 LAB — CBC WITH DIFFERENTIAL/PLATELET
Basophils Absolute: 0 10*3/uL (ref 0.0–0.1)
Basophils Relative: 0.5 % (ref 0.0–3.0)
Eosinophils Relative: 1.3 % (ref 0.0–5.0)
HCT: 40.1 % (ref 36.0–46.0)
Hemoglobin: 13.3 g/dL (ref 12.0–15.0)
Lymphocytes Relative: 37 % (ref 12.0–46.0)
Lymphs Abs: 2.1 10*3/uL (ref 0.7–4.0)
Monocytes Relative: 9.8 % (ref 3.0–12.0)
Neutro Abs: 2.9 10*3/uL (ref 1.4–7.7)
RBC: 4.68 Mil/uL (ref 3.87–5.11)
WBC: 5.6 10*3/uL (ref 4.5–10.5)

## 2012-04-12 LAB — BASIC METABOLIC PANEL
BUN: 12 mg/dL (ref 6–23)
CO2: 29 mEq/L (ref 19–32)
Chloride: 107 mEq/L (ref 96–112)
Creatinine, Ser: 0.8 mg/dL (ref 0.4–1.2)
Glucose, Bld: 92 mg/dL (ref 70–99)

## 2012-04-12 MED ORDER — BUPROPION HCL ER (XL) 300 MG PO TB24: 300.0000 mg | ORAL_TABLET | Freq: Every day | ORAL | Status: DC

## 2012-04-12 NOTE — Patient Instructions (Signed)
We will switch to the previous dose of Wellbutrin to  your previous pharmacy  Check your blood pressure daily in the morning at home  Return in one week with the data and the device  We will call you to more about her thyroid levels

## 2012-04-12 NOTE — Progress Notes (Signed)
  Subjective:    Patient ID: Cindy [name], female    DOB: 1951/08/10, 61 y.o.   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 No true spinning nausea vomiting hearing loss etc. Her symptoms tend to come and go. They're not positional.  Her BP at work was 120/80 BP initially when she came in here today was 130/98 right arm biracial repeat by me 150/100 pulse was 70 and regular positive family history of hypertension  She also recently switched her 300 Wellbutrin to a different manufacturer and wonders if this might be a side effect from the change in her Wellbutrin medication.  She has tapered off the Paxil about 3 months ago  She's also having difficulty with hair loss in both her sisters have hypothyroidism.   Review of Systems Gen. cardiovascular ENT review of systems otherwise negative    Objective:   Physical Exam  Well-developed and nourished female in no acute distress BP right arm sitting position 150/100 pulse 70 and regular neurologic exam normal cerebellar exam in particular normal neck normal thyroid gland not enlarged      Assessment & Plan:  Vertigo reassured  Hypertension question situational BP check daily followup in one week  Hair loss check thyroid levels

## 2012-04-13 ENCOUNTER — Telehealth: Payer: Self-pay | Admitting: Family Medicine

## 2012-04-13 MED ORDER — ONDANSETRON HCL 4 MG PO TABS: ORAL_TABLET | ORAL | Status: DC

## 2012-04-13 NOTE — Telephone Encounter (Signed)
Rx sent to pharmacy. Left message on machine for patient.  

## 2012-04-13 NOTE — Telephone Encounter (Signed)
Pt called and is having nausea with vertigo lite. Pt is req a [facility] med [facility] [place]

## 2012-04-13 NOTE — Telephone Encounter (Signed)
Zofran 4 mg dispensed 10 directions one half tablet twice daily when necessary for nausea refills x2

## 2012-04-14 ENCOUNTER — Telehealth: Payer: Self-pay | Admitting: Family Medicine

## 2012-04-14 NOTE — Telephone Encounter (Signed)
Pt called and said that she is still feeling nauseous and is light headed after taking Zofran. Pt said that she was able to sleep last night and pts bp has come down. Pt req call back from nurse asap today.

## 2012-04-18 NOTE — Telephone Encounter (Signed)
Dr Tawanna Cooler to speak with patient

## 2012-04-19 ENCOUNTER — Encounter: Payer: Self-pay | Admitting: Family Medicine

## 2012-04-19 ENCOUNTER — Ambulatory Visit (INDEPENDENT_AMBULATORY_CARE_PROVIDER_SITE_OTHER): Payer: 59 | Admitting: Family Medicine

## 2012-04-19 VITALS — BP 120/80

## 2012-04-19 DIAGNOSIS — R030 Elevated blood-pressure reading, without diagnosis of hypertension: Secondary | ICD-10-CM

## 2012-04-19 DIAGNOSIS — F329 Major depressive disorder, single episode, unspecified: Secondary | ICD-10-CM

## 2012-04-19 MED ORDER — PAROXETINE HCL 10 MG PO TABS: 10.0000 mg | ORAL_TABLET | Freq: Every day | ORAL | Status: DC

## 2012-04-19 NOTE — Patient Instructions (Signed)
Restart the Paxil 10 mg daily at bedtime return in 4 weeks for followup  Physical exam due November

## 2012-04-19 NOTE — Progress Notes (Signed)
  Subjective:    Patient ID: Cindy [name], female    DOB: [birth year], 61 y.o.   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 She wonders if a lot of her symptoms are related to Paxil withdrawal. This indeed could be the case.  She is on 450 mg of Wellbutrin in the morning and that seems to get her through the day. We discussed various options we'll try restarting the Paxil and see if that doesn't help.   Review of Systems Gen. review of systems negative    Objective:   Physical Exam  Well-developed and nourished female in acute distress BP right arm sitting position 120/80      Assessment & Plan:  Normal blood pressure  History of depression

## 2012-05-10 ENCOUNTER — Ambulatory Visit (INDEPENDENT_AMBULATORY_CARE_PROVIDER_SITE_OTHER): Payer: 59 | Admitting: Family Medicine

## 2012-05-10 DIAGNOSIS — M546 Pain in thoracic spine: Secondary | ICD-10-CM

## 2012-05-10 MED ORDER — MELOXICAM 7.5 MG PO TABS: ORAL_TABLET | ORAL | Status: DC

## 2012-05-10 MED ORDER — CYCLOBENZAPRINE HCL 10 MG PO TABS: 10.0000 mg | ORAL_TABLET | Freq: Every evening | ORAL | Status: AC | PRN
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
## 2012-05-12 ENCOUNTER — Encounter: Payer: Self-pay | Admitting: Family Medicine

## 2012-05-17 ENCOUNTER — Ambulatory Visit: Payer: 59 | Admitting: Family Medicine

## 2012-05-24 ENCOUNTER — Ambulatory Visit (INDEPENDENT_AMBULATORY_CARE_PROVIDER_SITE_OTHER): Payer: 59 | Admitting: Family Medicine

## 2012-05-24 ENCOUNTER — Encounter: Payer: Self-pay | Admitting: Family Medicine

## 2012-05-24 VITALS — BP 120/80 | Temp 98.4°F | Wt 186.0 lb

## 2012-05-24 DIAGNOSIS — F329 Major depressive disorder, single episode, unspecified: Secondary | ICD-10-CM

## 2012-05-24 NOTE — Patient Instructions (Signed)
Continue your current medications  Physical examination when do
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
## 2012-05-26 ENCOUNTER — Telehealth: Payer: Self-pay | Admitting: Family Medicine

## 2012-05-26 DIAGNOSIS — F329 Major depressive disorder, single episode, unspecified: Secondary | ICD-10-CM

## 2012-05-26 MED ORDER — PAROXETINE HCL 10 MG PO TABS: 10.0000 mg | ORAL_TABLET | Freq: Two times a day (BID) | ORAL | Status: DC

## 2012-05-26 NOTE — Telephone Encounter (Signed)
Pt called and said that she does need the Paxil 10 mg 2 times a day. Pls call in to [facility] pharmacy on [facility].

## 2012-05-26 NOTE — Telephone Encounter (Signed)
Rx sent to pharmacy   

## 2012-06-09 ENCOUNTER — Encounter: Payer: Self-pay | Admitting: Hematology & Oncology

## 2012-06-28 ENCOUNTER — Other Ambulatory Visit: Payer: Self-pay | Admitting: *Deleted

## 2012-06-28 DIAGNOSIS — E785 Hyperlipidemia, unspecified: Secondary | ICD-10-CM

## 2012-06-28 MED ORDER — LOVASTATIN 20 MG PO TABS
20.0000 mg | ORAL_TABLET | Freq: Every day | ORAL | Status: DC
Start: 2012-06-28 — End: 2012-10-20

## 2012-10-14 ENCOUNTER — Other Ambulatory Visit (INDEPENDENT_AMBULATORY_CARE_PROVIDER_SITE_OTHER): Payer: 59

## 2012-10-14 DIAGNOSIS — Z0000 Encounter for general adult medical examination without abnormal findings: Secondary | ICD-10-CM

## 2012-10-14 LAB — POCT URINALYSIS DIPSTICK
Bilirubin, UA: NEGATIVE
Glucose, UA: NEGATIVE
Ketones, UA: NEGATIVE
Leukocytes, UA: NEGATIVE
Protein, UA: NEGATIVE

## 2012-10-14 LAB — CBC WITH DIFFERENTIAL/PLATELET
Basophils Relative: 0.3 % (ref 0.0–3.0)
Eosinophils Relative: 1.2 % (ref 0.0–5.0)
HCT: 38.3 % (ref 36.0–46.0)
Hemoglobin: 12.8 g/dL (ref 12.0–15.0)
Lymphs Abs: 1.5 10*3/uL (ref 0.7–4.0)
MCV: 85.7 fl (ref 78.0–100.0)
Monocytes Absolute: 0.4 10*3/uL (ref 0.1–1.0)
Monocytes Relative: 8.2 % (ref 3.0–12.0)
Neutro Abs: 2.4 10*3/uL (ref 1.4–7.7)
WBC: 4.4 10*3/uL — ABNORMAL LOW (ref 4.5–10.5)

## 2012-10-14 LAB — BASIC METABOLIC PANEL
BUN: 11 mg/dL (ref 6–23)
CO2: 28 mEq/L (ref 19–32)
Calcium: 8.8 mg/dL (ref 8.4–10.5)
Creatinine, Ser: 0.8 mg/dL (ref 0.4–1.2)
Glucose, Bld: 91 mg/dL (ref 70–99)

## 2012-10-14 LAB — HEPATIC FUNCTION PANEL
AST: 21 U/L (ref 0–37)
Bilirubin, Direct: 0.1 mg/dL (ref 0.0–0.3)
Total Bilirubin: 0.4 mg/dL (ref 0.3–1.2)

## 2012-10-14 LAB — LIPID PANEL: HDL: 77.7 mg/dL (ref >39.00)

## 2012-10-14 LAB — TSH: TSH: 0.97 u[IU]/mL (ref 0.35–5.50)

## 2012-10-20 ENCOUNTER — Other Ambulatory Visit (HOSPITAL_COMMUNITY)
Admission: RE | Admit: 2012-10-20 | Discharge: 2012-10-20 | Disposition: A | Discharge status: Home / Self Care | Payer: 59 | Source: Ambulatory Visit | Attending: Family Medicine | Admitting: Family Medicine

## 2012-10-20 ENCOUNTER — Encounter: Payer: Self-pay | Admitting: Family Medicine

## 2012-10-20 ENCOUNTER — Ambulatory Visit (INDEPENDENT_AMBULATORY_CARE_PROVIDER_SITE_OTHER): Payer: 59 | Admitting: Family Medicine

## 2012-10-20 VITALS — BP 120/80 | Temp 98.0°F | Ht 67.0 in | Wt 184.0 lb

## 2012-10-20 DIAGNOSIS — Z01419 Encounter for gynecological examination (general) (routine) without abnormal findings: Secondary | ICD-10-CM

## 2012-10-20 DIAGNOSIS — Z853 Personal history of malignant neoplasm of breast: Secondary | ICD-10-CM

## 2012-10-20 DIAGNOSIS — K219 Gastro-esophageal reflux disease without esophagitis: Secondary | ICD-10-CM

## 2012-10-20 DIAGNOSIS — G43909 Migraine, unspecified, not intractable, without status migrainosus: Secondary | ICD-10-CM

## 2012-10-20 DIAGNOSIS — N6009 Solitary cyst of unspecified breast: Secondary | ICD-10-CM

## 2012-10-20 DIAGNOSIS — F329 Major depressive disorder, single episode, unspecified: Secondary | ICD-10-CM

## 2012-10-20 DIAGNOSIS — J309 Allergic rhinitis, unspecified: Secondary | ICD-10-CM

## 2012-10-20 DIAGNOSIS — B009 Herpesviral infection, unspecified: Secondary | ICD-10-CM | POA: Insufficient documentation

## 2012-10-20 DIAGNOSIS — E785 Hyperlipidemia, unspecified: Secondary | ICD-10-CM

## 2012-10-20 MED ORDER — LANSOPRAZOLE 15 MG PO CPDR: 15.0000 mg | DELAYED_RELEASE_CAPSULE | Freq: Two times a day (BID) | ORAL | Status: DC

## 2012-10-20 MED ORDER — PAROXETINE HCL 10 MG PO TABS: 10.0000 mg | ORAL_TABLET | Freq: Two times a day (BID) | ORAL | Status: DC

## 2012-10-20 MED ORDER — LOVASTATIN 20 MG PO TABS: 20.0000 mg | ORAL_TABLET | Freq: Every day | ORAL | Status: DC

## 2012-10-20 MED ORDER — BUPROPION HCL ER (XL) 300 MG PO TB24: 300.0000 mg | ORAL_TABLET | Freq: Every day | ORAL | Status: DC

## 2012-10-20 MED ORDER — CLONAZEPAM 1 MG PO TABS: 1.0000 mg | ORAL_TABLET | Freq: Two times a day (BID) | ORAL | Status: DC | PRN

## 2012-10-20 MED ORDER — BUPROPION HCL ER (SR) 150 MG PO TB12: 150.0000 mg | ORAL_TABLET | Freq: Every day | ORAL | Status: DC

## 2012-10-20 MED ORDER — VALACYCLOVIR HCL 500 MG PO TABS: ORAL_TABLET | ORAL | Status: DC

## 2012-10-20 MED ORDER — TRIAMCINOLONE ACETONIDE 0.025 % EX OINT
TOPICAL_OINTMENT | Freq: Two times a day (BID) | CUTANEOUS | Status: DC
Start: 2012-10-20 — End: 2013-10-19

## 2012-10-20 NOTE — Progress Notes (Signed)
  Subjective:    Patient ID: Cindy [name], female    DOB: [birth year], 61 y.o.   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 She would like a refill on a topical steroid cream. She has a number of bites on her lower extremities appear to be jiggers  She gets routine eye care, dental care, BSE monthly, and you mammography, colonoscopy normal last year, tetanus 2009, seasonal flu shot 2013.   Review of Systems  Constitutional: Negative.   HENT: Negative.   Eyes: Negative.   Respiratory: Negative.   Cardiovascular: Negative.   Gastrointestinal: Negative.   Genitourinary: Negative.   Musculoskeletal: Negative.   Neurological: Negative.   Hematological: Negative.   Psychiatric/Behavioral: Negative.        Objective:   Physical Exam  Constitutional: She appears well-developed and well-nourished.  HENT:  Head: Normocephalic and atraumatic.  Right Ear: External ear normal.  Left Ear: External ear normal.  Nose: Nose normal.  Mouth/Throat: Oropharynx is clear and moist.  Eyes: EOM are normal. Pupils are equal, round, and reactive to light.  Neck: Normal range of motion. Neck supple. No thyromegaly present.  Cardiovascular: Normal rate, regular rhythm, normal heart sounds and intact distal pulses.  Exam reveals no gallop and no friction rub.   No murmur heard. Pulmonary/Chest: Effort normal and breath sounds normal.  Abdominal: Soft. Bowel sounds are normal. She exhibits no distension and no mass.  There is no tenderness. There is no rebound.  Genitourinary: Vagina normal and uterus normal. Guaiac negative stool. No vaginal discharge found.       Bilateral breast exam shows a scar above the left breast where she had a Port-A-Cath and no palpable masses  Right breast shows a scar in the axillary area where she had the sentinel node biopsy scar 10:00 2 inches from the nipple where she had the lumpectomy and tattoos from her radiation there also are a number of fibrocystic changes under the nipple and extending to the 6:00 position below that nipple.  Musculoskeletal: Normal range of motion.  Lymphadenopathy:    She has no cervical adenopathy.  Neurological: She is alert. She has normal reflexes. No cranial nerve deficit. She exhibits normal muscle tone. Coordination normal.  Skin: Skin is warm and dry.  Psychiatric: She has a normal mood and affect. Her behavior is normal. Judgment and thought content normal.          Assessment & Plan:  Healthy female  His history of depression continue Wellbutrin  History of anxiety attacks Klonopin when necessary  Reflux esophagitis continue Prevacid  Hyperlipidemia continue Mevacor  Occasional HSV one Valtrex when necessary  History of breast cancer asymptomatic fibrocystic changes right breast referred back to radiology for followup ultrasound for confirmation

## 2012-10-20 NOTE — Progress Notes (Signed)
  Subjective:    Patient ID: Cindy [name], female    DOB: Aug 10, 1951, 61 y.o.   MRN: [id number]  HPI    Review of Systems     Objective:   Physical Exam        Assessment & Plan:

## 2012-10-20 NOTE — Patient Instructions (Addendum)
Continue your current good health habits  Continue your current medications  We will get you set her for an ultrasound of those fibrocystic lesions in your right breast for confirmation  Return in one year sooner if any problems

## 2012-10-21 ENCOUNTER — Other Ambulatory Visit: Payer: Self-pay | Admitting: *Deleted

## 2012-10-21 MED ORDER — BUPROPION HCL ER (XL) 150 MG PO TB24: 150.0000 mg | ORAL_TABLET | Freq: Every day | ORAL | Status: DC

## 2013-05-03 ENCOUNTER — Other Ambulatory Visit: Payer: Self-pay | Admitting: *Deleted

## 2013-05-03 DIAGNOSIS — F329 Major depressive disorder, single episode, unspecified: Secondary | ICD-10-CM

## 2013-05-03 MED ORDER — CLONAZEPAM 1 MG PO TABS: 1.0000 mg | ORAL_TABLET | Freq: Two times a day (BID) | ORAL | Status: DC | PRN

## 2013-08-18 ENCOUNTER — Other Ambulatory Visit: Payer: Self-pay | Admitting: *Deleted

## 2013-08-18 ENCOUNTER — Telehealth: Payer: Self-pay | Admitting: Family Medicine

## 2013-08-18 MED ORDER — BUPROPION HCL ER (SR) 150 MG PO TB12: 150.0000 mg | ORAL_TABLET | Freq: Three times a day (TID) | ORAL | Status: DC

## 2013-08-18 MED ORDER — BUPROPION HCL ER (XL) 150 MG PO TB24: ORAL_TABLET | ORAL | Status: DC

## 2013-08-18 NOTE — Telephone Encounter (Signed)
Spoke with pharmacy and new Rx sent. 

## 2013-08-18 NOTE — Telephone Encounter (Signed)
Pharmacy calling to discuss the buPROPion [facility] SR) 150 MG 12 hr tablet Please call her back.

## 2013-08-18 NOTE — Telephone Encounter (Signed)
Patient request buproprion 150 three times a day because of cost.

## 2013-08-29 LAB — HM DIABETES EYE EXAM

## 2013-09-04 ENCOUNTER — Encounter: Payer: Self-pay | Admitting: Family Medicine

## 2013-09-11 ENCOUNTER — Telehealth: Payer: Self-pay | Admitting: Family Medicine

## 2013-09-11 NOTE — Telephone Encounter (Signed)
Pt would like orders for CPX labs to be mailed to her, in order for her to take them to [facility] to have them completed. She states that she used to work there and would like to have her labs completed there. Please assist.

## 2013-09-11 NOTE — Telephone Encounter (Signed)
Order faxed to home address

## 2013-10-19 ENCOUNTER — Encounter: Payer: Self-pay | Admitting: Family Medicine

## 2013-10-19 ENCOUNTER — Ambulatory Visit: Payer: 59 | Admitting: Family Medicine

## 2013-10-19 ENCOUNTER — Ambulatory Visit (INDEPENDENT_AMBULATORY_CARE_PROVIDER_SITE_OTHER): Payer: 59 | Admitting: Family Medicine

## 2013-10-19 VITALS — BP 120/80 | Temp 98.4°F | Wt 180.0 lb

## 2013-10-19 DIAGNOSIS — K219 Gastro-esophageal reflux disease without esophagitis: Secondary | ICD-10-CM

## 2013-10-19 DIAGNOSIS — M542 Cervicalgia: Secondary | ICD-10-CM

## 2013-10-19 DIAGNOSIS — E785 Hyperlipidemia, unspecified: Secondary | ICD-10-CM

## 2013-10-19 DIAGNOSIS — F329 Major depressive disorder, single episode, unspecified: Secondary | ICD-10-CM

## 2013-10-19 DIAGNOSIS — B009 Herpesviral infection, unspecified: Secondary | ICD-10-CM

## 2013-10-19 DIAGNOSIS — J309 Allergic rhinitis, unspecified: Secondary | ICD-10-CM

## 2013-10-19 MED ORDER — LOVASTATIN 20 MG PO TABS: 20.0000 mg | ORAL_TABLET | Freq: Every day | ORAL | Status: DC

## 2013-10-19 MED ORDER — TRIAMCINOLONE ACETONIDE 0.025 % EX OINT: TOPICAL_OINTMENT | Freq: Two times a day (BID) | CUTANEOUS | Status: DC

## 2013-10-19 MED ORDER — LANSOPRAZOLE 15 MG PO CPDR: 15.0000 mg | DELAYED_RELEASE_CAPSULE | Freq: Two times a day (BID) | ORAL | Status: DC

## 2013-10-19 MED ORDER — CYCLOBENZAPRINE HCL 10 MG PO TABS: ORAL_TABLET | ORAL | Status: DC

## 2013-10-19 MED ORDER — BUPROPION HCL ER (XL) 150 MG PO TB24: ORAL_TABLET | ORAL | Status: DC

## 2013-10-19 MED ORDER — VALACYCLOVIR HCL 500 MG PO TABS: ORAL_TABLET | ORAL | Status: DC

## 2013-10-19 MED ORDER — PAROXETINE HCL 10 MG PO TABS: ORAL_TABLET | ORAL | Status: DC

## 2013-10-19 NOTE — Patient Instructions (Signed)
Wellbutrin 150 mg............ decrease to 2 tablets daily  Paxil 10 mg........... decrease to one tablet Monday Wednesday Friday  Discontinue the Klonopin  Flexeril 10 mg.................. one half to one tablet at bedtime for neck pain  Soft cervical collar

## 2013-10-19 NOTE — Progress Notes (Signed)
  Subjective:    Patient ID: Cindy [name], female    DOB: 27-May-1951, 62 y.o.   MRN: [id number]  HPI C. 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 who comes in today to discuss her medications and a new problem of right-sided neck pain  She is on Wellbutrin 450 mg daily along with Paxil 10 mg daily and now but she's retired once and no friction taper off some of her medications  She takes Klonopin at bedtime for sleep but it doesn't seem to be helping. She wakes up in the morning with pain in the right side of her neck for the last 3 months. No history of trauma. The acyclovir she takes one daily in the Klonopin she's been taken one each bedtime when necessary 6   Review of Systems Review of systems negative.......Marland Kitchen enjoying her retirement her husband Cindy [name] also retired in January    Objective:   Physical Exam  Well-developed well-nourished female no acute distress vital signs stable she's afebrile BP normal examination neck she has full range of motion some tenderness in the right sternocleidomastoid muscle. Strength sensation reflexes all within normal limits      Assessment & Plan:  Depression plan taper medication  Cervical neck pain soft cervical collar and switched to Flexeril each bedtime DC the Klonopin  Continue your other medications

## 2013-11-27 ENCOUNTER — Encounter: Payer: 59 | Admitting: Family Medicine

## 2013-12-18 ENCOUNTER — Ambulatory Visit (INDEPENDENT_AMBULATORY_CARE_PROVIDER_SITE_OTHER): Payer: 59 | Admitting: Family Medicine

## 2013-12-18 ENCOUNTER — Other Ambulatory Visit (HOSPITAL_COMMUNITY)
Admission: RE | Admit: 2013-12-18 | Discharge: 2013-12-18 | Disposition: A | Discharge status: Home / Self Care | Payer: 59 | Source: Ambulatory Visit | Attending: Family Medicine | Admitting: Family Medicine

## 2013-12-18 ENCOUNTER — Encounter: Payer: Self-pay | Admitting: Family Medicine

## 2013-12-18 VITALS — BP 130/80 | Temp 97.6°F | Ht 67.75 in | Wt 182.0 lb

## 2013-12-18 DIAGNOSIS — Z853 Personal history of malignant neoplasm of breast: Secondary | ICD-10-CM

## 2013-12-18 DIAGNOSIS — F3289 Other specified depressive episodes: Secondary | ICD-10-CM

## 2013-12-18 DIAGNOSIS — Z01419 Encounter for gynecological examination (general) (routine) without abnormal findings: Secondary | ICD-10-CM

## 2013-12-18 DIAGNOSIS — J309 Allergic rhinitis, unspecified: Secondary | ICD-10-CM

## 2013-12-18 DIAGNOSIS — B009 Herpesviral infection, unspecified: Secondary | ICD-10-CM

## 2013-12-18 DIAGNOSIS — E785 Hyperlipidemia, unspecified: Secondary | ICD-10-CM

## 2013-12-18 DIAGNOSIS — F329 Major depressive disorder, single episode, unspecified: Secondary | ICD-10-CM

## 2013-12-18 DIAGNOSIS — G43909 Migraine, unspecified, not intractable, without status migrainosus: Secondary | ICD-10-CM

## 2013-12-18 DIAGNOSIS — K219 Gastro-esophageal reflux disease without esophagitis: Secondary | ICD-10-CM

## 2013-12-18 LAB — POCT URINALYSIS DIPSTICK
BILIRUBIN UA: NEGATIVE
Blood, UA: NEGATIVE
Glucose, UA: NEGATIVE
Ketones, UA: NEGATIVE
Nitrite, UA: NEGATIVE
PH UA: 6.5
Protein, UA: NEGATIVE
Spec Grav, UA: 1.01
Urobilinogen, UA: 0.2

## 2013-12-18 MED ORDER — LOVASTATIN 20 MG PO TABS: 20.0000 mg | ORAL_TABLET | Freq: Every day | ORAL | Status: DC

## 2013-12-18 MED ORDER — PAROXETINE HCL 10 MG PO TABS: ORAL_TABLET | ORAL | Status: DC

## 2013-12-18 MED ORDER — VALACYCLOVIR HCL 500 MG PO TABS: ORAL_TABLET | ORAL | Status: DC

## 2013-12-18 MED ORDER — BUPROPION HCL ER (XL) 150 MG PO TB24: ORAL_TABLET | ORAL | Status: DC

## 2013-12-18 MED ORDER — LANSOPRAZOLE 15 MG PO CPDR: 15.0000 mg | DELAYED_RELEASE_CAPSULE | Freq: Two times a day (BID) | ORAL | Status: DC

## 2013-12-18 NOTE — Progress Notes (Signed)
   Subjective:    Patient ID: Cindy [name], female    DOB: [birth year], 63 y.o.   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  She gets routine eye care, dental care, BSE monthly, and you mammography, colonoscopy and GI,  She says she feels well except she has a lesion on her right labia this been swollen tender and draining pus.  She is retired and is [place] church work but is not exercising on a daily basis  She had a lumpectomy with postop radiation oncology has stopped following her.   Review of Systems  Constitutional: Negative.   HENT: Negative.   Eyes: Negative.   Respiratory: Negative.   Cardiovascular: Negative.   Gastrointestinal: Negative.   Endocrine: Negative.   Genitourinary: Negative.   Musculoskeletal: Negative.   Allergic/Immunologic: Negative.   Neurological: Negative.   Hematological: Negative.   Psychiatric/Behavioral: Negative.        Objective:   Physical Exam  Nursing note and vitals reviewed. Constitutional: She appears well-developed and well-nourished.  HENT:  Head: Normocephalic and atraumatic.  Right Ear: External ear normal.  Left Ear: External ear normal.  Nose: Nose normal.  Mouth/Throat: Oropharynx is clear and moist.  Eyes: EOM are normal. Pupils are equal, round, and reactive to light.  Neck: Normal range of motion. Neck supple. No thyromegaly present.  Cardiovascular: Normal rate, regular rhythm, normal heart sounds and intact distal pulses.  Exam reveals no gallop and no friction rub.   No murmur heard. Pulmonary/Chest: Effort normal and breath sounds normal.  Abdominal: Soft. Bowel sounds  are normal. She exhibits no distension and no mass. There is no tenderness. There is no rebound.  Genitourinary: Vagina normal and uterus normal. Guaiac negative stool. No vaginal discharge found.  Bilateral breast exam normal except for scar left breast at the upper 11:00 position. Tattoos from previous radiation no palpable masses  There is a pea-sized cystic lesion right lateral labia also one above it. The lower one has is red and inflamed but not draining any pus  Musculoskeletal: Normal range of motion.  Lymphadenopathy:    She has no cervical adenopathy.  Neurological: She is alert. She has normal reflexes. No cranial nerve deficit. She exhibits normal muscle tone. Coordination normal.  Skin: Skin is warm and dry.  Psychiatric: She has a normal mood and affect. Her behavior is normal. Judgment and thought content normal.          Assessment & Plan:  Healthy female  History depression continue current medications  Reflux esophagitis continue Prevacid  Hyperlipidemia continue Mevacor and aspirin  Recurrent HSV 1 Doubtrex when necessary  Status post breast cancer  Cystic lesion right labia observe

## 2013-12-18 NOTE — Patient Instructions (Signed)
Continue your current medications  Begin a walking program........... think about joining the [employer]  Followup in 1 year sooner if any problem

## 2013-12-19 LAB — BASIC METABOLIC PANEL
BUN: 15 mg/dL (ref 6–23)
CALCIUM: 9.4 mg/dL (ref 8.4–10.5)
CO2: 28 mEq/L (ref 19–32)
Chloride: 103 mEq/L (ref 96–112)
Creatinine, Ser: 1 mg/dL (ref 0.4–1.2)
GFR: 63.57 mL/min (ref >60.00)
Glucose, Bld: 102 mg/dL — ABNORMAL HIGH (ref 70–99)
POTASSIUM: 4.5 meq/L (ref 3.5–5.1)
Sodium: 140 mEq/L (ref 135–145)

## 2013-12-19 LAB — CBC WITH DIFFERENTIAL/PLATELET
BASOS ABS: 0 10*3/uL (ref 0.0–0.1)
Basophils Relative: 0.4 % (ref 0.0–3.0)
Eosinophils Absolute: 0.1 10*3/uL (ref 0.0–0.7)
Eosinophils Relative: 0.9 % (ref 0.0–5.0)
HEMATOCRIT: 39.4 % (ref 36.0–46.0)
Hemoglobin: 13.1 g/dL (ref 12.0–15.0)
Lymphocytes Relative: 27 % (ref 12.0–46.0)
Lymphs Abs: 2.4 10*3/uL (ref 0.7–4.0)
MCHC: 33.3 g/dL (ref 30.0–36.0)
MCV: 86.5 fl (ref 78.0–100.0)
MONOS PCT: 7.1 % (ref 3.0–12.0)
Monocytes Absolute: 0.6 10*3/uL (ref 0.1–1.0)
Neutro Abs: 5.8 10*3/uL (ref 1.4–7.7)
Neutrophils Relative %: 64.6 % (ref 43.0–77.0)
PLATELETS: 226 10*3/uL (ref 150.0–400.0)
RBC: 4.56 Mil/uL (ref 3.87–5.11)
RDW: 14.9 % — AB (ref 11.5–14.6)
WBC: 9 10*3/uL (ref 4.5–10.5)

## 2013-12-19 LAB — HEPATIC FUNCTION PANEL
ALBUMIN: 4.4 g/dL (ref 3.5–5.2)
ALK PHOS: 102 U/L (ref 39–117)
ALT: 28 U/L (ref 0–35)
AST: 29 U/L (ref 0–37)
BILIRUBIN DIRECT: 0 mg/dL (ref 0.0–0.3)
TOTAL PROTEIN: 7.6 g/dL (ref 6.0–8.3)
Total Bilirubin: 0.4 mg/dL (ref 0.3–1.2)

## 2013-12-19 LAB — LIPID PANEL
CHOL/HDL RATIO: 3
Cholesterol: 217 mg/dL — ABNORMAL HIGH (ref 0–200)
HDL: 70.6 mg/dL (ref >39.00)
Triglycerides: 123 mg/dL (ref 0.0–149.0)
VLDL: 24.6 mg/dL (ref 0.0–40.0)

## 2013-12-19 LAB — LDL CHOLESTEROL, DIRECT: LDL DIRECT: 126.4 mg/dL

## 2013-12-19 LAB — TSH: TSH: 0.73 u[IU]/mL (ref 0.35–5.50)

## 2014-01-08 ENCOUNTER — Emergency Department (HOSPITAL_COMMUNITY)
Admission: EM | Admit: 2014-01-08 | Discharge: 2014-01-08 | Disposition: A | Discharge status: Home / Self Care | Payer: 59 | Source: Home / Self Care

## 2014-01-08 ENCOUNTER — Encounter (HOSPITAL_COMMUNITY): Payer: Self-pay | Admitting: Emergency Medicine

## 2014-01-08 DIAGNOSIS — J111 Influenza due to unidentified influenza virus with other respiratory manifestations: Secondary | ICD-10-CM

## 2014-01-08 MED ORDER — OSELTAMIVIR PHOSPHATE 75 MG PO CAPS: 75.0000 mg | ORAL_CAPSULE | Freq: Two times a day (BID) | ORAL | Status: DC

## 2014-01-08 NOTE — ED Notes (Signed)
Pt c/o cold sxs onset yest Sxs include: productive cough, fevers, BA Denies: v/n/d, SOB, wheezing Took advil today at 0600 and has been taking mucinex w/no relief She is alert w/no signs of acute distress.  

## 2014-01-08 NOTE — ED Provider Notes (Signed)
Medical screening examination/treatment/procedure(s) were performed by non-physician practitioner and as supervising physician I was immediately available for consultation/collaboration.  Philipp Deputy, M.D.   Harden Mo, MD 01/08/14 [phone]

## 2014-01-08 NOTE — Discharge Instructions (Signed)
Influenza, Adult Influenza ("the flu") is a viral infection of the respiratory tract. It occurs more often in winter months because people spend more time in close contact with one another. Influenza can make you feel very sick. Influenza easily spreads from person to person (contagious). CAUSES  Influenza is caused by a virus that infects the respiratory tract. You can catch the virus by breathing in droplets from an infected person's cough or sneeze. You can also catch the virus by touching something that was recently contaminated with the virus and then touching your mouth, nose, or eyes. SYMPTOMS  Symptoms typically last 4 to 10 days and may include:  Fever.  Chills.  Headache, body aches, and muscle aches.  Sore throat.  Chest discomfort and cough.  Poor appetite.  Weakness or feeling tired.  Dizziness.  Nausea or vomiting. DIAGNOSIS  Diagnosis of influenza is often made based on your history and a physical exam. A nose or throat swab test can be done to confirm the diagnosis. RISKS AND COMPLICATIONS You may be at risk for a more severe case of influenza if you smoke cigarettes, have diabetes, have chronic heart disease (such as heart failure) or lung disease (such as asthma), or if you have a weakened immune system. Elderly people and pregnant women are also at risk for more serious infections. The most common complication of influenza is a lung infection (pneumonia). Sometimes, this complication can require emergency medical care and may be life-threatening. PREVENTION  An annual influenza vaccination (flu shot) is the best way to avoid getting influenza. An annual flu shot is now routinely recommended for all adults in the [place] TREATMENT  In mild cases, influenza goes away on its own. Treatment is directed at relieving symptoms. For more severe cases, your caregiver may prescribe antiviral medicines to shorten the sickness. Antibiotic medicines are not effective, because the  infection is caused by a virus, not by bacteria. HOME CARE INSTRUCTIONS  Only take over-the-counter or prescription medicines for pain, discomfort, or fever as directed by your caregiver.  Use a cool mist humidifier to make breathing easier.  Get plenty of rest until your temperature returns to normal. This usually takes 3 to 4 days.  Drink enough fluids to keep your urine clear or pale yellow.  Cover your mouth and nose when coughing or sneezing, and wash your hands well to avoid spreading the virus.  Stay home from work or school until your fever has been gone for at least 1 full day. SEEK MEDICAL CARE IF:   You have chest pain or a deep cough that worsens or produces more mucus.  You have nausea, vomiting, or diarrhea. SEEK IMMEDIATE MEDICAL CARE IF:   You have difficulty breathing, shortness of breath, or your skin or nails turn bluish.  You have severe neck pain or stiffness.  You have a severe headache, facial pain, or earache.  You have a worsening or recurring fever.  You have nausea or vomiting that cannot be controlled. MAKE SURE YOU:  Understand these instructions.  Will watch your condition.  Will get help right away if you are not doing well or get worse. Document Released: 11/27/2000 Document Revised: 05/31/2012 Document Reviewed: 02/29/2012 [facility] Patient Information 2014 [place], [place].  Upper Respiratory Infection, Adult An upper respiratory infection (URI) is also sometimes known as the common cold. The upper respiratory tract includes the nose, sinuses, throat, trachea, and bronchi. Bronchi are the airways leading to the lungs. Most people improve within 1 week, but  symptoms can last up to 2 weeks. A residual cough may last even longer.  CAUSES Many different viruses can infect the tissues lining the upper respiratory tract. The tissues become irritated and inflamed and often become very moist. Mucus production is also common. A cold is contagious. You  can easily spread the virus to others by oral contact. This includes kissing, sharing a glass, coughing, or sneezing. Touching your mouth or nose and then touching a surface, which is then touched by another person, can also spread the virus. SYMPTOMS  Symptoms typically develop 1 to 3 days after you come in contact with a cold virus. Symptoms vary from person to person. They may include:  Runny nose.  Sneezing.  Nasal congestion.  Sinus irritation.  Sore throat.  Loss of voice (laryngitis).  Cough.  Fatigue.  Muscle aches.  Loss of appetite.  Headache.  Low-grade fever. DIAGNOSIS  You might diagnose your own cold based on familiar symptoms, since most people get a cold 2 to 3 times a year. Your caregiver can confirm this based on your exam. Most importantly, your caregiver can check that your symptoms are not due to another disease such as strep throat, sinusitis, pneumonia, asthma, or epiglottitis. Blood tests, throat tests, and X-rays are not necessary to diagnose a common cold, but they may sometimes be helpful in excluding other more serious diseases. Your caregiver will decide if any further tests are required. RISKS AND COMPLICATIONS  You may be at risk for a more severe case of the common cold if you smoke cigarettes, have chronic heart disease (such as heart failure) or lung disease (such as asthma), or if you have a weakened immune system. The very young and very old are also at risk for more serious infections. Bacterial sinusitis, middle ear infections, and bacterial pneumonia can complicate the common cold. The common cold can worsen asthma and chronic obstructive pulmonary disease (COPD). Sometimes, these complications can require emergency medical care and may be life-threatening. PREVENTION  The best way to protect against getting a cold is to practice good hygiene. Avoid oral or hand contact with people with cold symptoms. Wash your hands often if contact occurs.  There is no clear evidence that vitamin C, vitamin E, echinacea, or exercise reduces the chance of developing a cold. However, it is always recommended to get plenty of rest and practice good nutrition. TREATMENT  Treatment is directed at relieving symptoms. There is no cure. Antibiotics are not effective, because the infection is caused by a virus, not by bacteria. Treatment may include:  Increased fluid intake. Sports drinks offer valuable electrolytes, sugars, and fluids.  Breathing heated mist or steam (vaporizer or shower).  Eating chicken soup or other clear broths, and maintaining good nutrition.  Getting plenty of rest.  Using gargles or lozenges for comfort.  Controlling fevers with ibuprofen or acetaminophen as directed by your caregiver.  Increasing usage of your inhaler if you have asthma. Zinc gel and zinc lozenges, taken in the first 24 hours of the common cold, can shorten the duration and lessen the severity of symptoms. Pain medicines may help with fever, muscle aches, and throat pain. A variety of non-prescription medicines are available to treat congestion and runny nose. Your caregiver can make recommendations and may suggest nasal or lung inhalers for other symptoms.  HOME CARE INSTRUCTIONS   Only take over-the-counter or prescription medicines for pain, discomfort, or fever as directed by your caregiver.  Use a warm mist humidifier or inhale  steam from a shower to increase air moisture. This may keep secretions moist and make it easier to breathe.  Drink enough water and fluids to keep your urine clear or pale yellow.  Rest as needed.  Return to work when your temperature has returned to normal or as your caregiver advises. You may need to stay home longer to avoid infecting others. You can also use a face mask and careful hand washing to prevent spread of the virus. SEEK MEDICAL CARE IF:   After the first few days, you feel you are getting worse rather than  better.  You need your caregiver's advice about medicines to control symptoms.  You develop chills, worsening shortness of breath, or brown or red sputum. These may be signs of pneumonia.  You develop yellow or brown nasal discharge or pain in the face, especially when you bend forward. These may be signs of sinusitis.  You develop a fever, swollen neck glands, pain with swallowing, or white areas in the back of your throat. These may be signs of strep throat. SEEK IMMEDIATE MEDICAL CARE IF:   You have a fever.  You develop severe or persistent headache, ear pain, sinus pain, or chest pain.  You develop wheezing, a prolonged cough, cough up blood, or have a change in your usual mucus (if you have chronic lung disease).  You develop sore muscles or a stiff neck. Document Released: 05/26/2001 Document Revised: 02/22/2012 Document Reviewed: 04/03/2011 [facility] Patient Information 2014 [place], [place].

## 2014-01-08 NOTE — ED Provider Notes (Signed)
CSN: [id number]     Arrival date & time 01/08/14  0801 History   None    Chief Complaint  Patient presents with  . URI   (Consider location/radiation/quality/duration/timing/severity/associated sxs/prior Treatment) HPI Comments: 63 year old female and her husband developed acute onset of moderate to severe myalgias last night. They had difficulty sleeping due to the aches and pains and the patient also developed cough, PND and sinus discomfort. She did not take her temperature but felt she may have a low-grade fever. She did have a flu shot this year.   Past Medical History  Diagnosis Date  . Cancer     breast  . Depression   . Arthritis   . GERD (gastroesophageal reflux disease)   . Hyperlipidemia   . Tendonitis    Past Surgical History  Procedure Laterality Date  . Breast surgery      right  . Elbow surgery      right   Family History  Problem Relation Age of Onset  . Cancer Mother     bone  . Hypertension Father   . Diabetes Father   . Stroke Father   . Hypertension Sister   . Alzheimer's disease Brother   . Hypertension Sister    History  Substance Use Topics  . Smoking status: Never Smoker   . Smokeless tobacco: Never Used  . Alcohol Use: Yes     Comment: 1-2 glasses of wine a month   OB History   Grav Para Term Preterm Abortions TAB SAB Ect Mult Living                 Review of Systems  Constitutional: Positive for fever, activity change, appetite change and fatigue. Negative for chills.  HENT: Positive for congestion, postnasal drip and rhinorrhea. Negative for facial swelling and sore throat.   Eyes: Negative.   Respiratory: Positive for cough. Negative for shortness of breath.   Cardiovascular: Negative.   Gastrointestinal: Negative.   Musculoskeletal: Negative for neck pain and neck stiffness.  Skin: Negative for pallor and rash.  Neurological: Negative.     Allergies  Review of patient's allergies indicates no known allergies.  Home  Medications   Current Outpatient Rx  Name  Route  Sig  Dispense  Refill  . buPROPion (WELLBUTRIN XL) 150 MG 24 hr tablet      3 tabs daily   270 tablet   3   . Calcium Carbonate-Vit D-Min (CALCIUM 1200 PO)   Oral   Take by mouth.           . co-enzyme Q-10 30 MG capsule   Oral   Take 30 mg by mouth daily.         . cyclobenzaprine (FLEXERIL) 10 MG tablet      1 by mouth each bedtime   60 tablet   5   . fish oil-omega-3 fatty acids 1000 MG capsule   Oral   Take 1 g by mouth daily.         Marland Kitchen glucosamine-chondroitin 500-400 MG tablet   Oral   Take 1 tablet by mouth daily.           . lansoprazole (PREVACID) 15 MG capsule   Oral   Take 1 capsule (15 mg total) by mouth 2 (two) times daily.   200 capsule   3   . lovastatin (MEVACOR) 20 MG tablet   Oral   Take 1 tablet (20 mg total) by mouth at bedtime.   100  tablet   3   . Multiple Vitamin (MULTIVITAMIN) tablet   Oral   Take 1 tablet by mouth daily.           Marland Kitchen oseltamivir (TAMIFLU) 75 MG capsule   Oral   Take 1 capsule (75 mg total) by mouth every 12 (twelve) hours.   10 capsule   0   . PARoxetine (PAXIL) 10 MG tablet      1 by mouth every morning   100 tablet   3   . triamcinolone (KENALOG) 0.025 % ointment   Topical   Apply topically 2 (two) times daily.   80 g   2   . valACYclovir (VALTREX) 500 MG tablet      1 by mouth daily   100 tablet   3    BP 105/75  Pulse 95  Temp(Src) 98.4 F (36.9 C) (Oral)  Resp 18  SpO2 95% Physical Exam  Nursing note and vitals reviewed. Constitutional: She is oriented to person, place, and time. She appears well-developed and well-nourished. No distress.  HENT:  Mouth/Throat: No oropharyngeal exudate.  Bilateral TMs are normal Oropharynx with moderate erythema, cobblestoning in clear PND  Eyes: Conjunctivae and EOM are normal.  Neck: Normal range of motion. Neck supple.  Cardiovascular: Normal rate and regular rhythm.   Murmur  heard. Pulmonary/Chest: Effort normal and breath sounds normal. No respiratory distress. She has no wheezes. She has no rales.  Abdominal: Soft. There is no tenderness.  Musculoskeletal: Normal range of motion. She exhibits no edema.  Lymphadenopathy:    She has no cervical adenopathy.  Neurological: She is alert and oriented to person, place, and time.  Skin: Skin is warm and dry. No rash noted.  Psychiatric: She has a normal mood and affect.    ED Course  Procedures (including critical care time) Labs Review Labs Reviewed - No data to display Imaging Review No results found.    MDM   1. Influenza      Tamiflu 75 mg twice a day for 5 days Continue the ibuprofen for aches and pains and fever Recommend adding Alka-Seltzer cold plus nighttime relief Continue taking the Mucinex DM. Stay home, rest and drink plenty of fluids.  Janne Napoleon, NP 01/08/14 [phone]

## 2014-01-09 ENCOUNTER — Telehealth: Payer: Self-pay | Admitting: Family Medicine

## 2014-01-09 NOTE — Telephone Encounter (Signed)
Patient Information:  Caller Name: Bri[place]  Phone: [phone]  Patient: Cindy [name]  Gender: Female  DOB: [birth year]  Age: 63 Years  PCP: Stevie Kern [facility])  Office Follow Up:  Does the office need to follow up with this patient?: No  Instructions For The Office: N/A  RN Note:  Patient calling about vomiting which started 01/09/14.  Diagnosed with influenza 01/08/14.  States she has had episodes x 2 since vomiting started.  Per vomiting protocol, emergent symptoms denied; Care measures advised, with callback parameters given.  Per standing orders, Rx for phenergan 25mg , 1 PR q 4h prn nausea/vomiting, #6, NR called in to [facility] [phone].  krs/can  Symptoms  Reason For Call & Symptoms: vomiting  Reviewed Health History In EMR: Yes  Reviewed Medications In EMR: Yes  Reviewed Allergies In EMR: Yes  Reviewed Surgeries / Procedures: Yes  Date of Onset of Symptoms: 01/09/2014  Guideline(s) Used:  Vomiting  Disposition Per Guideline:   Home Care  Reason For Disposition Reached:   Vomiting with diarrhea  Advice Given:  N/A  Patient Will Follow Care Advice:  YES

## 2014-01-10 ENCOUNTER — Ambulatory Visit (INDEPENDENT_AMBULATORY_CARE_PROVIDER_SITE_OTHER): Payer: 59 | Admitting: Family Medicine

## 2014-01-10 ENCOUNTER — Encounter: Payer: Self-pay | Admitting: Family Medicine

## 2014-01-10 ENCOUNTER — Telehealth: Payer: Self-pay | Admitting: Family Medicine

## 2014-01-10 VITALS — BP 120/80 | Temp 98.2°F | Wt 180.0 lb

## 2014-01-10 DIAGNOSIS — K625 Hemorrhage of anus and rectum: Secondary | ICD-10-CM

## 2014-01-10 DIAGNOSIS — B349 Viral infection, unspecified: Secondary | ICD-10-CM

## 2014-01-10 DIAGNOSIS — B9789 Other viral agents as the cause of diseases classified elsewhere: Secondary | ICD-10-CM

## 2014-01-10 MED ORDER — ONDANSETRON HCL 4 MG PO TABS: 4.0000 mg | ORAL_TABLET | Freq: Three times a day (TID) | ORAL | Status: DC | PRN

## 2014-01-10 MED ORDER — HYDROCODONE-HOMATROPINE 5-1.5 MG/5ML PO SYRP: 5.0000 mL | ORAL_SOLUTION | Freq: Three times a day (TID) | ORAL | Status: DC | PRN

## 2014-01-10 NOTE — Telephone Encounter (Signed)
Pt states this am she now has blood in her stool. Wanted to speak w/ dr todd, transferred pt to triage.

## 2014-01-10 NOTE — Telephone Encounter (Signed)
Patient Information:  Caller Name: Elly  Phone: [phone]  Patient: Cindy [name], Cindy [name]  Gender: Female  DOB: Apr 20, 1951  Age: 63 Years  PCP: Stevie Kern [facility])  Office Follow Up:  Does the office need to follow up with this patient?: Yes  Instructions For The Office: Call patient back with further instructions.  RN Note:  Spoke with Curt Bears in office regarding disposition. She requested a note be sent to the office regarding this patient and someone will call her back with instructions.  Symptoms  Reason For Call & Symptoms: Blood in stools; Reports approximately 5 episodes of blood bowel movements. Patient had used a Phenergan suppository before the bleeding started.  Reviewed Health History In EMR: Yes  Reviewed Medications In EMR: Yes  Reviewed Allergies In EMR: Yes  Reviewed Surgeries / Procedures: Yes  Date of Onset of Symptoms: 01/09/2014  Guideline(s) Used:  Diarrhea  Rectal Bleeding  Disposition Per Guideline:   Go to ED Now (or to Office with PCP Approval)  Reason For Disposition Reached:   Bloody, black, or tarry bowel movements  Advice Given:  N/A  Patient Will Follow Care Advice:  YES

## 2014-01-10 NOTE — Progress Notes (Signed)
   Subjective:    Patient ID: Cindy [name], female    DOB: 28-Sep-1951, 63 y.o.   MRN: [id number]  HPI Cindy [name] is a 63 year old married female nonsmoker who comes in today with a viral syndrome and rectal bleeding  She states on Saturday she felt achy slight cough and headache chills scratchy throat no fever and Sunday went to an urgent care and was told she had the flu and was given Tamiflu. On Monday she developed nausea vomiting and diarrhea. He then called her in some Phenergan suppositories. After using a Phenergan suppository she developed bright red rectal bleeding.   Review of Systems Review of systems otherwise negative    Objective:   Physical Exam Well-developed well-nourished female no acute distress vital signs stable she is afebrile  Abdominal exam the abdomen is flat the bowel sounds are normal no tenderness. In the left lateral position inspection the rectum shows a slight tear in the rectal mucosa from probably the Phenergan suppository       Assessment & Plan:  Viral syndrome with side effects from Tamiflu and now [facility] rectal bleeding from Phenergan suppositories plan see orders

## 2014-01-10 NOTE — Patient Instructions (Signed)
Stop the Tamiflu  Rest at home  Drink lots of liquids  Tylenol,,,,,,,,,,,, 2 tabs 3 times daily when necessary  Zofran 4 mg.......... one every 6-8 hours when necessary for nausea and vomiting  Hydromet........Marland Kitchen 1/2-1 teaspoon 3 times daily when necessary for cough and cold symptoms,

## 2014-01-10 NOTE — Telephone Encounter (Signed)
Patient here for office visit

## 2014-01-25 ENCOUNTER — Telehealth: Payer: Self-pay | Admitting: Family Medicine

## 2014-01-25 MED ORDER — SULFAMETHOXAZOLE-TMP DS 800-160 MG PO TABS: 1.0000 | ORAL_TABLET | Freq: Two times a day (BID) | ORAL | Status: DC

## 2014-01-25 NOTE — Telephone Encounter (Signed)
Pt has uti and needs abx call into [facility]. Pt is aware md will call her back today

## 2014-01-25 NOTE — Telephone Encounter (Signed)
Rx sent to pharmacy and patient is aware

## 2014-02-07 ENCOUNTER — Other Ambulatory Visit: Payer: Self-pay | Admitting: Family Medicine

## 2014-08-14 ENCOUNTER — Encounter: Payer: Self-pay | Admitting: Hematology & Oncology

## 2014-09-11 ENCOUNTER — Telehealth: Payer: Self-pay | Admitting: Family Medicine

## 2014-09-11 MED ORDER — CLONAZEPAM 1 MG PO TABS: ORAL_TABLET | ORAL | Status: DC

## 2014-09-11 NOTE — Telephone Encounter (Signed)
rx called into pharmacy

## 2014-09-11 NOTE — Telephone Encounter (Signed)
[facility] - [place], [place] - [id number]. Is requesting re-fill on clonazePAM (KLONOPIN) 1 MG tablet

## 2014-09-17 ENCOUNTER — Ambulatory Visit (INDEPENDENT_AMBULATORY_CARE_PROVIDER_SITE_OTHER): Payer: BC Managed Care – PPO | Admitting: Family Medicine

## 2014-09-17 ENCOUNTER — Encounter: Payer: Self-pay | Admitting: Family Medicine

## 2014-09-17 VITALS — BP 130/90 | Temp 98.0°F | Wt 182.0 lb

## 2014-09-17 DIAGNOSIS — Z23 Encounter for immunization: Secondary | ICD-10-CM

## 2014-09-17 DIAGNOSIS — F418 Other specified anxiety disorders: Secondary | ICD-10-CM

## 2014-09-17 DIAGNOSIS — K602 Anal fissure, unspecified: Secondary | ICD-10-CM

## 2014-09-17 MED ORDER — HYDROCORTISONE 2.5 % RE CREA: TOPICAL_CREAM | RECTAL | Status: DC

## 2014-09-17 NOTE — Patient Instructions (Signed)
Begin a 30 minute exercise program daily  Continue your current medications  If you don't see any improvement in a month call and leave me a voicemail and I will call you back to discuss other options  Warm soaks each bedtime x5-10 minutes,,,,,,, applied a medicated cream at bedtime,,,,,,,, stool softeners daily  If you don't see any improvement in 2-3 weeks call your gastroenterologist for further evaluation

## 2014-09-17 NOTE — Progress Notes (Signed)
   Subjective:    Patient ID: Cindy [name], female    DOB: Nov 21, 1951, 63 y.o.   MRN: [id number]  HPI Cindy [name] is a 63 year old married female nonsmoker who comes in today for discuss 2 issues  She is a 63-year-old married female nonsmoker who comes in today for discuss 2 issues  She is a long-standing history of depression and mild anxiety and is on Klonopin 1 mg at bedtime, Flexeril 10 mg at bedtime, Wellbutrin 450 mg daily, Paxil 10 mg daily. She states that she feels overall fairly well except for 2-3 hours every week she has episodes of anxiety. She describes episodes as feeling anxious rapid heart rate etc.  We discussed various options including continuing medication adding exercise, changing medication, consult with Letta Moynahan  She also has anal fissure that she's been treating at home but doesn't seem to be healing. Her pain is minimal   Review of Systems Review of systems otherwise negative    Objective:   Physical Exam  Well-developed well nourished female no acute distress vital signs stable she is afebrile      Assessment & Plan:  Depression with anxiety........ discussed various options......... Cindy [name] will try to start a 30 minute daily exercise program and continue her current medications. If she doesn't see improvement in her symptoms in 30 days call and we will discuss other options  And no fissure by history........... warm soaks........ stool softener..... Anusol cream.......Marland Kitchen

## 2014-09-17 NOTE — Progress Notes (Signed)
Pre visit review using our clinic review tool, if applicable. No additional management support is needed unless otherwise documented below in the visit note. 

## 2014-10-23 ENCOUNTER — Other Ambulatory Visit: Payer: Self-pay | Admitting: Family Medicine

## 2014-12-06 ENCOUNTER — Other Ambulatory Visit (INDEPENDENT_AMBULATORY_CARE_PROVIDER_SITE_OTHER): Payer: BC Managed Care – PPO

## 2014-12-06 DIAGNOSIS — Z0000 Encounter for general adult medical examination without abnormal findings: Secondary | ICD-10-CM

## 2014-12-06 LAB — BASIC METABOLIC PANEL
BUN: 12 mg/dL (ref 6–23)
CHLORIDE: 105 meq/L (ref 96–112)
CO2: 28 mEq/L (ref 19–32)
Calcium: 9.1 mg/dL (ref 8.4–10.5)
Creatinine, Ser: 0.8 mg/dL (ref 0.4–1.2)
GFR: 75.1 mL/min (ref >60.00)
Glucose, Bld: 89 mg/dL (ref 70–99)
POTASSIUM: 4.2 meq/L (ref 3.5–5.1)
SODIUM: 142 meq/L (ref 135–145)

## 2014-12-06 LAB — CBC WITH DIFFERENTIAL/PLATELET
Basophils Absolute: 0 10*3/uL (ref 0.0–0.1)
Basophils Relative: 0.4 % (ref 0.0–3.0)
Eosinophils Absolute: 0.1 10*3/uL (ref 0.0–0.7)
Eosinophils Relative: 1.4 % (ref 0.0–5.0)
HEMATOCRIT: 38.6 % (ref 36.0–46.0)
HEMOGLOBIN: 12.5 g/dL (ref 12.0–15.0)
LYMPHS ABS: 2.3 10*3/uL (ref 0.7–4.0)
Lymphocytes Relative: 43.3 % (ref 12.0–46.0)
MCHC: 32.4 g/dL (ref 30.0–36.0)
MCV: 87.2 fl (ref 78.0–100.0)
MONO ABS: 0.5 10*3/uL (ref 0.1–1.0)
MONOS PCT: 9.3 % (ref 3.0–12.0)
NEUTROS ABS: 2.4 10*3/uL (ref 1.4–7.7)
Neutrophils Relative %: 45.6 % (ref 43.0–77.0)
PLATELETS: 183 10*3/uL (ref 150.0–400.0)
RBC: 4.43 Mil/uL (ref 3.87–5.11)
RDW: 15.2 % (ref 11.5–15.5)
WBC: 5.2 10*3/uL (ref 4.0–10.5)

## 2014-12-06 LAB — POCT URINALYSIS DIPSTICK
Bilirubin, UA: NEGATIVE
Blood, UA: NEGATIVE
Glucose, UA: NEGATIVE
Ketones, UA: NEGATIVE
NITRITE UA: NEGATIVE
PROTEIN UA: NEGATIVE
Spec Grav, UA: 1.01
UROBILINOGEN UA: 0.2
pH, UA: 7

## 2014-12-06 LAB — HEPATIC FUNCTION PANEL
ALBUMIN: 4.1 g/dL (ref 3.5–5.2)
ALT: 18 U/L (ref 0–35)
AST: 23 U/L (ref 0–37)
Alkaline Phosphatase: 79 U/L (ref 39–117)
BILIRUBIN TOTAL: 0.3 mg/dL (ref 0.2–1.2)
Bilirubin, Direct: 0 mg/dL (ref 0.0–0.3)
Total Protein: 6.8 g/dL (ref 6.0–8.3)

## 2014-12-06 LAB — LIPID PANEL
CHOLESTEROL: 181 mg/dL (ref 0–200)
HDL: 78 mg/dL (ref >39.00)
LDL CALC: 86 mg/dL (ref 0–99)
NonHDL: 103
TRIGLYCERIDES: 87 mg/dL (ref 0.0–149.0)
Total CHOL/HDL Ratio: 2
VLDL: 17.4 mg/dL (ref 0.0–40.0)

## 2014-12-06 LAB — TSH: TSH: 1.44 u[IU]/mL (ref 0.35–4.50)

## 2014-12-13 ENCOUNTER — Encounter: Payer: 59 | Admitting: Family Medicine

## 2014-12-17 ENCOUNTER — Other Ambulatory Visit: Payer: 59

## 2014-12-19 ENCOUNTER — Encounter: Payer: 59 | Admitting: Family Medicine

## 2014-12-24 ENCOUNTER — Encounter: Payer: Self-pay | Admitting: Family Medicine

## 2014-12-24 ENCOUNTER — Other Ambulatory Visit (HOSPITAL_COMMUNITY)
Admission: RE | Admit: 2014-12-24 | Discharge: 2014-12-24 | Disposition: A | Discharge status: Home / Self Care | Payer: 59 | Source: Ambulatory Visit | Attending: Family Medicine | Admitting: Family Medicine

## 2014-12-24 ENCOUNTER — Ambulatory Visit (INDEPENDENT_AMBULATORY_CARE_PROVIDER_SITE_OTHER): Payer: 59 | Admitting: Family Medicine

## 2014-12-24 VITALS — BP 120/80 | Temp 97.7°F | Ht 68.0 in | Wt 182.0 lb

## 2014-12-24 DIAGNOSIS — Z853 Personal history of malignant neoplasm of breast: Secondary | ICD-10-CM

## 2014-12-24 DIAGNOSIS — F330 Major depressive disorder, recurrent, mild: Secondary | ICD-10-CM

## 2014-12-24 DIAGNOSIS — B009 Herpesviral infection, unspecified: Secondary | ICD-10-CM

## 2014-12-24 DIAGNOSIS — K219 Gastro-esophageal reflux disease without esophagitis: Secondary | ICD-10-CM

## 2014-12-24 DIAGNOSIS — E785 Hyperlipidemia, unspecified: Secondary | ICD-10-CM

## 2014-12-24 DIAGNOSIS — Z01419 Encounter for gynecological examination (general) (routine) without abnormal findings: Secondary | ICD-10-CM | POA: Insufficient documentation

## 2014-12-24 DIAGNOSIS — Z1151 Encounter for screening for human papillomavirus (HPV): Secondary | ICD-10-CM | POA: Insufficient documentation

## 2014-12-24 DIAGNOSIS — G43809 Other migraine, not intractable, without status migrainosus: Secondary | ICD-10-CM

## 2014-12-24 DIAGNOSIS — F418 Other specified anxiety disorders: Secondary | ICD-10-CM

## 2014-12-24 DIAGNOSIS — J309 Allergic rhinitis, unspecified: Secondary | ICD-10-CM

## 2014-12-24 DIAGNOSIS — G43709 Chronic migraine without aura, not intractable, without status migrainosus: Secondary | ICD-10-CM

## 2014-12-24 MED ORDER — LANSOPRAZOLE 15 MG PO CPDR: 15.0000 mg | DELAYED_RELEASE_CAPSULE | Freq: Two times a day (BID) | ORAL | Status: DC

## 2014-12-24 MED ORDER — PAROXETINE HCL 10 MG PO TABS: ORAL_TABLET | ORAL | Status: DC

## 2014-12-24 MED ORDER — BUPROPION HCL ER (XL) 150 MG PO TB24: ORAL_TABLET | ORAL | Status: DC

## 2014-12-24 MED ORDER — LOVASTATIN 20 MG PO TABS: 20.0000 mg | ORAL_TABLET | Freq: Every day | ORAL | Status: DC

## 2014-12-24 NOTE — Progress Notes (Signed)
Pre visit review using our clinic review tool, if applicable. No additional management support is needed unless otherwise documented below in the visit note. 

## 2014-12-24 NOTE — Progress Notes (Signed)
   Subjective:    Patient ID: Cindy [name], female    DOB: July 30, 1951, 64 y.o.   MRN: [id number]  HPI    Review of Systems     Objective:   Physical Exam        Assessment & Plan:

## 2014-12-24 NOTE — Addendum Note (Signed)
Addended by: Westley Hummer B on: 12/24/2014 02:55 PM   Modules accepted: Orders

## 2014-12-24 NOTE — Progress Notes (Signed)
Subjective:    Patient ID: Cindy [name], female    DOB: [birth year], 64 y.o.   MRN: [id number]  HPI Cindy [name] is a 64 year old married female nonsmoker retired Theatre stage manager for [employer] who comes in today for general physical examination because of a history of mild depression, sleep dysfunction, reflux esophagitis, mild hyperlipidemia, and a history of breast cancer and migraine headaches  She's had one migraine in the last couple years she would like a refill on the Maxalt  She takes Wellbutrin 450 mg daily along with Klonopin 1 mg at bedtime and Paxil 10 mg daily. Neck combination she seems to be doing well and wishes to continue her medicines  She takes Prevacid 50 mg daily sometimes twice daily for chronic reflux  She takes Mevacor 20 mg daily for hyperlipidemia  She gets routine eye care, dental care, BSE monthly, annual mammography, colonoscopy 2012 normal  She had breast cancer 16 years ago. She had a lesion in her right breast that showed up at mammography. She had a lumpectomy and postop radiation has done well and no recurrence.  Cognitive function normal she walks daily home health safety reviewed no issues identified, no guns in the house, she does have a healthcare power of attorney and living well  Vaccinations updated by Apolonio Schneiders   Review of Systems  Constitutional: Negative.   HENT: Negative.   Eyes: Negative.   Respiratory: Negative.   Cardiovascular: Negative.   Gastrointestinal: Negative.   Endocrine: Negative.   Genitourinary: Negative.   Musculoskeletal: Negative.   Skin: Negative.   Allergic/Immunologic: Negative.   Neurological: Negative.   Hematological: Negative.   Psychiatric/Behavioral: Negative.        Objective:   Physical Exam  Constitutional: She is oriented to person, place, and time. She appears well-developed and well-nourished.  HENT:  Head: Normocephalic and atraumatic.  Right Ear: External ear normal.  Left Ear: External ear  normal.  Nose: Nose normal.  Mouth/Throat: Oropharynx is clear and moist.  Eyes: EOM are normal. Pupils are equal, round, and reactive to light.  Neck: Normal range of motion. Neck supple. No JVD present. No tracheal deviation present. No thyromegaly present.  Cardiovascular: Normal rate, regular rhythm, normal heart sounds and intact distal pulses.  Exam reveals no gallop and no friction rub.   No murmur heard. No carotid neurologic bruits peripheral pulses 2+ and symmetrical  Pulmonary/Chest: Effort normal and breath sounds normal. No stridor. No respiratory distress. She has no wheezes. She has no rales. She exhibits no tenderness.  Abdominal: Soft. Bowel sounds are normal. She exhibits no distension and no mass. There is no tenderness. There is no rebound and no guarding.  Genitourinary: Vagina normal and uterus normal. Guaiac negative stool. No vaginal discharge found.  Pelvic exam normal she does have some dryness in her cervix some spotting with the Pap explained this is not unusual however no other spotting or bleeding during the year  Bilateral breast exam shows a scar right breast 9:00 peripheral also axillary scar from previous lumpectomy and sentinel node biopsy. Also scar left breast at 12:00 from previous biopsy which was benign. No palpable masses  Musculoskeletal: Normal range of motion.  Lymphadenopathy:    She has no cervical adenopathy.  Neurological: She is alert and oriented to person, place, and time. She has normal reflexes. No cranial nerve deficit. She exhibits normal muscle tone. Coordination normal.  Skin: Skin is warm and dry. No rash noted. No erythema. No pallor.  Psychiatric:  She has a normal mood and affect. Her behavior is normal. Judgment and thought content normal.  Nursing note and vitals reviewed.         Assessment & Plan:  Healthy female  Migraine headaches........ diminishing over time..... Refill Maxalt when necessary  Hyperlipidemia.....Marland Kitchen  continue Mevacor  History of depression........ continue current meds  History of reflux esophagitis continue Prevacid 15 mg twice daily  History of breast cancer....... status post lumpectomy with postop radiation 16 years ago

## 2014-12-24 NOTE — Patient Instructions (Signed)
Continue current medications  Follow-up in one year sooner if any problems 

## 2014-12-25 ENCOUNTER — Other Ambulatory Visit: Payer: Self-pay | Admitting: Family Medicine

## 2014-12-25 LAB — CYTOLOGY - PAP

## 2015-01-01 ENCOUNTER — Encounter: Payer: BC Managed Care – PPO | Admitting: Family Medicine

## 2015-01-01 ENCOUNTER — Telehealth: Payer: Self-pay | Admitting: *Deleted

## 2015-01-01 MED ORDER — FLUCONAZOLE 150 MG PO TABS: ORAL_TABLET | ORAL | Status: DC

## 2015-01-01 NOTE — Telephone Encounter (Signed)
Rx sent per Dr Sherren Mocha.

## 2015-01-01 NOTE — Telephone Encounter (Signed)
Patient is requesting a prescription for a yeast infection.  She is having a creamy discharge, redness, and itching. She would like it sent to [facility]

## 2015-01-02 MED ORDER — FLUCONAZOLE 150 MG PO TABS: ORAL_TABLET | ORAL | Status: DC

## 2015-01-02 NOTE — Telephone Encounter (Addendum)
Pt would like this rx fluconazole (DIFLUCAN) 150 MG tablet Sent to the other [facility]  [address]  [facility]  [phone]

## 2015-01-02 NOTE — Addendum Note (Signed)
Addended by: Westley Hummer B on: 01/02/2015 02:54 PM   Modules accepted: Orders

## 2015-02-12 ENCOUNTER — Telehealth: Payer: Self-pay

## 2015-02-12 MED ORDER — OMEPRAZOLE 40 MG PO CPDR: 40.0000 mg | DELAYED_RELEASE_CAPSULE | Freq: Every day | ORAL | Status: DC

## 2015-02-12 NOTE — Telephone Encounter (Signed)
[facility] request to change LANSOPRAZOLE to OMEPRAZOLE, PANTOPRAZOLE or RANEPRAZOLE for insurance purposes.

## 2015-02-13 ENCOUNTER — Ambulatory Visit: Payer: 59 | Admitting: Family Medicine

## 2015-03-13 ENCOUNTER — Telehealth: Payer: Self-pay | Admitting: Family Medicine

## 2015-03-13 NOTE — Telephone Encounter (Signed)
Pt called and ask if you would give her a call.

## 2015-03-13 NOTE — Telephone Encounter (Signed)
Left message on machine for patient returning her call 

## 2015-03-13 NOTE — Telephone Encounter (Signed)
Spoke with patient.

## 2015-04-04 ENCOUNTER — Other Ambulatory Visit: Payer: Self-pay | Admitting: Family Medicine

## 2015-04-05 ENCOUNTER — Telehealth: Payer: Self-pay | Admitting: Family Medicine

## 2015-04-05 MED ORDER — OMEPRAZOLE 40 MG PO CPDR
40.0000 mg | DELAYED_RELEASE_CAPSULE | Freq: Two times a day (BID) | ORAL | Status: DC
Start: 2015-04-05 — End: 2016-02-24

## 2015-04-05 NOTE — Telephone Encounter (Signed)
Pt called to say that she need her record updated that she is taking the following medicine twice a day    omeprazole (PRILOSEC) 40 MG capsule

## 2015-04-05 NOTE — Telephone Encounter (Signed)
Medication was updated in pt's chart.

## 2015-05-27 ENCOUNTER — Telehealth: Payer: Self-pay

## 2015-05-27 NOTE — Telephone Encounter (Signed)
Not due until 05/28/16

## 2015-06-03 LAB — HM MAMMOGRAPHY

## 2015-06-04 ENCOUNTER — Encounter: Payer: Self-pay | Admitting: Family Medicine

## 2015-07-08 ENCOUNTER — Other Ambulatory Visit: Payer: Self-pay | Admitting: Family Medicine

## 2015-10-11 ENCOUNTER — Ambulatory Visit (INDEPENDENT_AMBULATORY_CARE_PROVIDER_SITE_OTHER): Payer: 59 | Admitting: *Deleted

## 2015-10-11 DIAGNOSIS — Z23 Encounter for immunization: Secondary | ICD-10-CM

## 2015-10-29 ENCOUNTER — Other Ambulatory Visit: Payer: Self-pay | Admitting: Family Medicine

## 2016-01-07 ENCOUNTER — Other Ambulatory Visit: Payer: Self-pay | Admitting: Family Medicine

## 2016-01-07 MED FILL — PARoxetine HCL 10 MG TABS: 10 | 90 days supply | Qty: 90 | Fill #0

## 2016-01-07 MED FILL — LOVASTATIN 20 MG TABLET: 20 | 90 days supply | Qty: 90 | Fill #0

## 2016-01-07 MED FILL — clonazePAM 1 MG TABS: 1 | 30 days supply | Qty: 60 | Fill #2

## 2016-01-07 MED FILL — OMEPRAZOLE DR 40 MG CAPSULE: 40 | 90 days supply | Qty: 90 | Fill #5

## 2016-01-07 MED FILL — BUPROPION HCL XL 150 MG TAB: 150 | 90 days supply | Qty: 270 | Fill #0

## 2016-02-17 ENCOUNTER — Other Ambulatory Visit (INDEPENDENT_AMBULATORY_CARE_PROVIDER_SITE_OTHER): Payer: BLUE CROSS/BLUE SHIELD

## 2016-02-17 DIAGNOSIS — Z0000 Encounter for general adult medical examination without abnormal findings: Secondary | ICD-10-CM | POA: Diagnosis not present

## 2016-02-17 LAB — CBC WITH DIFFERENTIAL/PLATELET
Basophils Absolute: 0 10*3/uL (ref 0.0–0.1)
Basophils Relative: 0.4 % (ref 0.0–3.0)
Eosinophils Absolute: 0.1 10*3/uL (ref 0.0–0.7)
Eosinophils Relative: 1 % (ref 0.0–5.0)
HCT: 38 % (ref 36.0–46.0)
Hemoglobin: 12.8 g/dL (ref 12.0–15.0)
Lymphocytes Relative: 35.3 % (ref 12.0–46.0)
Lymphs Abs: 2 10*3/uL (ref 0.7–4.0)
MCHC: 33.8 g/dL (ref 30.0–36.0)
MCV: 84.6 fl (ref 78.0–100.0)
Monocytes Absolute: 0.4 10*3/uL (ref 0.1–1.0)
Monocytes Relative: 7.1 % (ref 3.0–12.0)
Neutro Abs: 3.2 10*3/uL (ref 1.4–7.7)
Neutrophils Relative %: 56.2 % (ref 43.0–77.0)
Platelets: 193 10*3/uL (ref 150.0–400.0)
RBC: 4.5 Mil/uL (ref 3.87–5.11)
RDW: 15.1 % (ref 11.5–15.5)
WBC: 5.7 10*3/uL (ref 4.0–10.5)

## 2016-02-17 LAB — BASIC METABOLIC PANEL
BUN: 9 mg/dL (ref 6–23)
CALCIUM: 9.2 mg/dL (ref 8.4–10.5)
CO2: 29 mEq/L (ref 19–32)
CREATININE: 0.75 mg/dL (ref 0.40–1.20)
Chloride: 104 mEq/L (ref 96–112)
GFR: 82.92 mL/min (ref >60.00)
GLUCOSE: 90 mg/dL (ref 70–99)
Potassium: 3.9 mEq/L (ref 3.5–5.1)
Sodium: 141 mEq/L (ref 135–145)

## 2016-02-17 LAB — HEPATIC FUNCTION PANEL
ALT: 18 U/L (ref 0–35)
AST: 17 U/L (ref 0–37)
Albumin: 4.3 g/dL (ref 3.5–5.2)
Alkaline Phosphatase: 83 U/L (ref 39–117)
Bilirubin, Direct: 0.1 mg/dL (ref 0.0–0.3)
Total Bilirubin: 0.5 mg/dL (ref 0.2–1.2)
Total Protein: 6.4 g/dL (ref 6.0–8.3)

## 2016-02-17 LAB — LIPID PANEL
CHOL/HDL RATIO: 2
CHOLESTEROL: 186 mg/dL (ref 0–200)
HDL: 77.9 mg/dL (ref >39.00)
LDL CALC: 86 mg/dL (ref 0–99)
NonHDL: 107.88
TRIGLYCERIDES: 107 mg/dL (ref 0.0–149.0)
VLDL: 21.4 mg/dL (ref 0.0–40.0)

## 2016-02-17 LAB — POC URINALSYSI DIPSTICK (AUTOMATED)
BILIRUBIN UA: NEGATIVE
Blood, UA: NEGATIVE
GLUCOSE UA: NEGATIVE
Ketones, UA: NEGATIVE
NITRITE UA: NEGATIVE
Protein, UA: NEGATIVE
Spec Grav, UA: 1.015
UROBILINOGEN UA: 1
pH, UA: 7.5

## 2016-02-17 LAB — TSH: TSH: 0.77 u[IU]/mL (ref 0.35–4.50)

## 2016-02-18 ENCOUNTER — Other Ambulatory Visit: Payer: Self-pay | Admitting: Family Medicine

## 2016-02-18 MED FILL — VALACYCLOVIR HCL 500 MG TAB: 500 | 90 days supply | Qty: 90 | Fill #0 | Status: TO

## 2016-02-24 ENCOUNTER — Encounter: Payer: Self-pay | Admitting: Family Medicine

## 2016-02-24 ENCOUNTER — Ambulatory Visit (INDEPENDENT_AMBULATORY_CARE_PROVIDER_SITE_OTHER): Payer: BLUE CROSS/BLUE SHIELD | Admitting: Family Medicine

## 2016-02-24 VITALS — BP 120/80 | Temp 98.5°F | Ht 68.0 in | Wt 178.0 lb

## 2016-02-24 DIAGNOSIS — Z853 Personal history of malignant neoplasm of breast: Secondary | ICD-10-CM | POA: Diagnosis not present

## 2016-02-24 DIAGNOSIS — Z0000 Encounter for general adult medical examination without abnormal findings: Secondary | ICD-10-CM

## 2016-02-24 DIAGNOSIS — F418 Other specified anxiety disorders: Secondary | ICD-10-CM | POA: Diagnosis not present

## 2016-02-24 DIAGNOSIS — E785 Hyperlipidemia, unspecified: Secondary | ICD-10-CM

## 2016-02-24 DIAGNOSIS — K219 Gastro-esophageal reflux disease without esophagitis: Secondary | ICD-10-CM

## 2016-02-24 MED ORDER — PAROXETINE HCL 10 MG PO TABS: 10.0000 mg | ORAL_TABLET | Freq: Every morning | ORAL | Status: DC

## 2016-02-24 MED ORDER — VALACYCLOVIR HCL 500 MG PO TABS: 500.0000 mg | ORAL_TABLET | Freq: Every day | ORAL | Status: DC

## 2016-02-24 MED ORDER — OMEPRAZOLE 40 MG PO CPDR: 40.0000 mg | DELAYED_RELEASE_CAPSULE | Freq: Every day | ORAL | Status: DC

## 2016-02-24 MED ORDER — BUPROPION HCL ER (XL) 150 MG PO TB24: 450.0000 mg | ORAL_TABLET | Freq: Every day | ORAL | Status: DC

## 2016-02-24 MED ORDER — LOVASTATIN 20 MG PO TABS: 20.0000 mg | ORAL_TABLET | Freq: Every day | ORAL | Status: DC

## 2016-02-24 MED ORDER — CLONAZEPAM 1 MG PO TABS: 1.0000 mg | ORAL_TABLET | Freq: Two times a day (BID) | ORAL | Status: DC

## 2016-02-24 MED FILL — clonazePAM 1 MG TABS: 1 | 30 days supply | Qty: 60 | Fill #0

## 2016-02-24 NOTE — Progress Notes (Signed)
Pre visit review using our clinic review tool, if applicable. No additional management support is needed unless otherwise documented below in the visit note. 

## 2016-02-24 NOTE — Patient Instructions (Signed)
Continue current medications  Continue daily exercise  Return in one year sooner if any problems  Rachel's extension is [phone]

## 2016-02-24 NOTE — Progress Notes (Signed)
   Subjective:    Patient ID: Cindy [name], female    DOB: [birth year], 65 y.o.   MRN: [id number]  HPI [name] is a 65 year old married female nonsmoker who comes in today for general physical examination  She takes Wellbutrin 150 mg dose 3 tabs daily and Paxil 10 mg because of history of mild depression  She also takes Klonopin 1 mg at bedtime for sleep  She takes Mevacor 20 mg daily for hyperlipidemia, Prilosec 40 mg for chronic reflux. 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  She gets routine eye care, dental care, BSE monthly, annual mammography, colonoscopy 2012 normal  Vaccinations up-to-date  Social history she is married she [place] in [place]. She is retired Animator.   Review of Systems  Constitutional: Negative.   HENT: Negative.   Eyes: Negative.   Respiratory: Negative.   Cardiovascular: Negative.   Gastrointestinal: Negative.   Endocrine: Negative.   Genitourinary: Negative.   Musculoskeletal: Negative.   Skin: Negative.   Allergic/Immunologic: Negative.   Neurological: Negative.   Hematological: Negative.   Psychiatric/Behavioral: Negative.        Objective:   Physical Exam  Constitutional: She appears well-developed and well-nourished.  HENT:  Head: Normocephalic and atraumatic.  Right Ear: External ear normal.  Left Ear: External ear normal.  Nose: Nose normal.  Mouth/Throat: Oropharynx is clear and moist.  Eyes: EOM are normal. Pupils are equal, round, and reactive to light.  Neck: Normal range of motion. Neck supple. No JVD present. No tracheal deviation present. No thyromegaly present.  Cardiovascular: Normal rate, regular rhythm, normal heart sounds and intact distal pulses.  Exam reveals no gallop and no friction rub.   No murmur heard. No carotid neurologic bruits peripheral pulses 2+ and symmetrical  Pulmonary/Chest: Effort normal and breath sounds normal. No stridor. No respiratory distress. She has no wheezes.  She has no rales. She exhibits no tenderness.  Abdominal: Soft. Bowel sounds are normal. She exhibits no distension and no mass. There is no tenderness. There is no rebound and no guarding.  Genitourinary:  Bilateral breast exam normal except for scars from previous excisional biopsy and postsurgical radiation. This was 18 years ago.  Pelvic and Pap last year normal therefore not repeated. GYN wise she is asymptomatic  Musculoskeletal: Normal range of motion.  Lymphadenopathy:    She has no cervical adenopathy.  Neurological: She is alert. She has normal reflexes. No cranial nerve deficit. She exhibits normal muscle tone. Coordination normal.  Skin: Skin is warm and dry. No rash noted. No erythema. No pallor.  Psychiatric: She has a normal mood and affect. Her behavior is normal. Judgment and thought content normal.  Nursing note and vitals reviewed.         Assessment & Plan:  History of mild depression............. continue Wellbutrin and Paxil  Sleep dysfunction............. continue Klonopin  Hyperlipidemia........... continue Mevacor  Reflux esophagitis........ continue Prilosec  Status post right breast cancer 18 years ago.

## 2016-02-25 ENCOUNTER — Telehealth: Payer: Self-pay | Admitting: *Deleted

## 2016-02-25 NOTE — Telephone Encounter (Signed)
Patient states she has been constipated.  Yesterday as she was bearing down having a bowel movement she noticed a "red polyp".  Please advise.

## 2016-02-26 MED ORDER — HYDROCORTISONE ACETATE 25 MG RE SUPP: 25.0000 mg | Freq: Two times a day (BID) | RECTAL | Status: DC

## 2016-02-26 NOTE — Telephone Encounter (Signed)
Per Dr Sherren Mocha sitz bath and Anusol.  Call back if no improvement.

## 2016-02-28 ENCOUNTER — Telehealth: Payer: Self-pay | Admitting: Gastroenterology

## 2016-02-28 NOTE — Telephone Encounter (Signed)
Patient reports she has a "red polyp" mass she can see in her rectum.  I asked her to clarify if this is internal or external and she reports it is "internal".  She reports it is bright red and "looks like a polyp".  She discussed with Dr. Sherren Mocha and he prescribed sitz baths and hydrocortisone suppositories.  She is convinced that this is not a hemorrhoid.  She is not due for a recall colonoscopy until 2022.  Patient denies any pain or discomfort.  Dr. Fuller Plan I have scheduled her for an office visit for 04/13/16.  She feels this is more urgent.  NO APP appts at this time.  Please advise.

## 2016-03-02 NOTE — Telephone Encounter (Signed)
Patient contacted and scheduled to see Dr. Fuller Plan on 03/11/16 at 3:00 due to a cancellation.

## 2016-03-11 ENCOUNTER — Ambulatory Visit (INDEPENDENT_AMBULATORY_CARE_PROVIDER_SITE_OTHER): Payer: BLUE CROSS/BLUE SHIELD | Admitting: Gastroenterology

## 2016-03-11 ENCOUNTER — Encounter: Payer: Self-pay | Admitting: Gastroenterology

## 2016-03-11 VITALS — BP 128/70 | HR 72 | Ht 68.0 in | Wt 180.0 lb

## 2016-03-11 DIAGNOSIS — K648 Other hemorrhoids: Secondary | ICD-10-CM

## 2016-03-11 DIAGNOSIS — K5900 Constipation, unspecified: Secondary | ICD-10-CM

## 2016-03-11 NOTE — Patient Instructions (Signed)
Continue hydrocortisone suppositories and colace daily.   Please call back in 2 weeks with an update on your symptoms.   High-Fiber Diet Fiber, also called dietary fiber, is a type of carbohydrate found in fruits, vegetables, whole grains, and beans. A high-fiber diet can have many health benefits. Your health care provider may recommend a high-fiber diet to help:  Prevent constipation. Fiber can make your bowel movements more regular.  Lower your cholesterol.  Relieve hemorrhoids, uncomplicated diverticulosis, or irritable bowel syndrome.  Prevent overeating as part of a weight-loss plan.  Prevent heart disease, type 2 diabetes, and certain cancers. WHAT IS MY PLAN? The recommended daily intake of fiber includes:  38 grams for men under age 28.  66 grams for men over age 15.  69 grams for women under age 81.  55 grams for women over age 54. You can get the recommended daily intake of dietary fiber by eating a variety of fruits, vegetables, grains, and beans. Your health care provider may also recommend a fiber supplement if it is not possible to get enough fiber through your diet. WHAT DO I NEED TO KNOW ABOUT A HIGH-FIBER DIET?  Fiber supplements have not been widely studied for their effectiveness, so it is better to get fiber through food sources.  Always check the fiber content on thenutrition facts label of any prepackaged food. Look for foods that contain at least 5 grams of fiber per serving.  Ask your dietitian if you have questions about specific foods that are related to your condition, especially if those foods are not listed in the following section.  Increase your daily fiber consumption gradually. Increasing your intake of dietary fiber too quickly may cause bloating, cramping, or gas.  Drink plenty of water. Water helps you to digest fiber. WHAT FOODS CAN I EAT? Grains Whole-grain breads. Multigrain cereal. Oats and oatmeal. Brown rice. Barley. Bulgur wheat.  [place]. Bran muffins. Popcorn. Rye wafer crackers. Vegetables Sweet potatoes. Spinach. Kale. Artichokes. Cabbage. Broccoli. Green peas. Carrots. Squash. Fruits Berries. Pears. Apples. Oranges. Avocados. Prunes and raisins. Dried figs. Meats and Other Protein Sources Navy, kidney, pinto, and soy beans. Split peas. Lentils. Nuts and seeds. Dairy Fiber-fortified yogurt. Beverages Fiber-fortified soy milk. Fiber-fortified orange juice. Other Fiber bars. The items listed above may not be a complete list of recommended foods or beverages. Contact your dietitian for more options. WHAT FOODS ARE NOT RECOMMENDED? Grains White bread. Pasta made with refined flour. White rice. Vegetables Fried potatoes. Canned vegetables. Well-cooked vegetables.  Fruits Fruit juice. Cooked, strained fruit. Meats and Other Protein Sources Fatty cuts of meat. Fried Sales executive or fried fish. Dairy Milk. Yogurt. Cream cheese. Sour cream. Beverages Soft drinks. Other Cakes and pastries. Butter and oils. The items listed above may not be a complete list of foods and beverages to avoid. Contact your dietitian for more information. WHAT ARE SOME TIPS FOR INCLUDING HIGH-FIBER FOODS IN MY DIET?  Eat a wide variety of high-fiber foods.  Make sure that half of all grains consumed each day are whole grains.  Replace breads and cereals made from refined flour or white flour with whole-grain breads and cereals.  Replace white rice with brown rice, bulgur wheat, or millet.  Start the day with a breakfast that is high in fiber, such as a cereal that contains at least 5 grams of fiber per serving.  Use beans in place of meat in soups, salads, or pasta.  Eat high-fiber snacks, such as berries, raw vegetables, nuts, or popcorn.  This information is not intended to replace advice given to you by your health care provider. Make sure you discuss any questions you have with your health care provider.   Document Released:  11/30/2005 Document Revised: 12/21/2014 Document Reviewed: 05/15/2014 Elsevier Interactive Patient Education [employer].

## 2016-03-11 NOTE — Progress Notes (Signed)
    History of Present Illness: This is a 65 year old female referred by Cindy Cookey, MD for the evaluation of constipation and hemorrhoids. She underwent colonoscopy in 11/2011 which was normal. Constipation she feels is related to Wellbutrin and she takes Colace daily and MOM prn. About 2 weeks ago she noted the gradual onset of rectal discomfort and swelling. He noticed prolapsing of tissue associated with bowel movements. She was prescribed hydrocortisone suppositories and sitz baths by Dr. Sherren Mocha and her symptoms have stayed initially improved but not completely resolved.  Review of Systems: Pertinent positive and negative review of systems were noted in the above HPI section. All other review of systems were otherwise negative.  Current Medications, Allergies, Past Medical History, Past Surgical History, Family History and Social History were reviewed in [employer] record.  Physical Exam: General: Well developed, well nourished, no acute distress Head: Normocephalic and atraumatic Eyes:  sclerae anicteric, EOMI Ears: Normal auditory acuity Mouth: No deformity or lesions Neck: Supple, no masses or thyromegaly Lungs: Clear throughout to auscultation Heart: Regular rate and rhythm; no murmurs, rubs or bruits Abdomen: Soft, non tender and non distended. No masses, hepatosplenomegaly or hernias noted. Normal Bowel sounds Rectal: No lesions, no tenderness, heme neg stool Musculoskeletal: Symmetrical with no gross deformities  Skin: No lesions on visible extremities Pulses:  Normal pulses noted Extremities: No clubbing, cyanosis, edema or deformities noted Neurological: Alert oriented x 4, grossly nonfocal Cervical Nodes:  No significant cervical adenopathy Inguinal Nodes: No significant inguinal adenopathy Psychological:  Alert and cooperative. Normal mood and affect  Assessment and Recommendations:  1. Constipation. High fiber diet with adequate daily water  intake. Colace daily. Consider a daily fiber supplement and Colace is not adequate. Milk of magnesia as needed.  2. Suspected internal hemorrhoids. Continue hydrocortisone suppositories daily and sitz baths twice daily. If symptoms have not completely resolved within 2 weeks. Schedule colonoscopy for further evaluation.   cc: Cindy Cookey, MD [address] [place], [place] [address]

## 2016-04-06 MED FILL — LOVASTATIN 20 MG TABLET: 20 | 90 days supply | Qty: 90 | Fill #1

## 2016-04-06 MED FILL — PARoxetine HCL 10 MG TABS: 10 | 90 days supply | Qty: 90 | Fill #1

## 2016-04-06 MED FILL — BUPROPION HCL XL 150 MG TAB: 150 | 90 days supply | Qty: 270 | Fill #1

## 2016-04-06 MED FILL — OMEPRAZOLE DR 40 MG CAPSULE: 40 | 90 days supply | Qty: 90 | Fill #6

## 2016-04-13 ENCOUNTER — Ambulatory Visit: Payer: BLUE CROSS/BLUE SHIELD | Admitting: Gastroenterology

## 2016-04-17 ENCOUNTER — Emergency Department (HOSPITAL_COMMUNITY)
Admission: EM | Admit: 2016-04-17 | Discharge: 2016-04-18 | Disposition: A | Discharge status: Home / Self Care | Payer: BLUE CROSS/BLUE SHIELD | Attending: Emergency Medicine | Admitting: Emergency Medicine

## 2016-04-17 ENCOUNTER — Emergency Department (HOSPITAL_COMMUNITY): Payer: BLUE CROSS/BLUE SHIELD

## 2016-04-17 ENCOUNTER — Encounter (HOSPITAL_COMMUNITY): Payer: Self-pay | Admitting: *Deleted

## 2016-04-17 DIAGNOSIS — E785 Hyperlipidemia, unspecified: Secondary | ICD-10-CM | POA: Insufficient documentation

## 2016-04-17 DIAGNOSIS — Z79899 Other long term (current) drug therapy: Secondary | ICD-10-CM | POA: Diagnosis not present

## 2016-04-17 DIAGNOSIS — Z853 Personal history of malignant neoplasm of breast: Secondary | ICD-10-CM | POA: Diagnosis not present

## 2016-04-17 DIAGNOSIS — F329 Major depressive disorder, single episode, unspecified: Secondary | ICD-10-CM | POA: Diagnosis not present

## 2016-04-17 DIAGNOSIS — S8001XA Contusion of right knee, initial encounter: Secondary | ICD-10-CM | POA: Insufficient documentation

## 2016-04-17 DIAGNOSIS — S0990XA Unspecified injury of head, initial encounter: Secondary | ICD-10-CM | POA: Diagnosis present

## 2016-04-17 DIAGNOSIS — W108XXA Fall (on) (from) other stairs and steps, initial encounter: Secondary | ICD-10-CM | POA: Diagnosis not present

## 2016-04-17 DIAGNOSIS — K219 Gastro-esophageal reflux disease without esophagitis: Secondary | ICD-10-CM | POA: Diagnosis not present

## 2016-04-17 DIAGNOSIS — Y998 Other external cause status: Secondary | ICD-10-CM | POA: Insufficient documentation

## 2016-04-17 DIAGNOSIS — Y9301 Activity, walking, marching and hiking: Secondary | ICD-10-CM | POA: Insufficient documentation

## 2016-04-17 DIAGNOSIS — Y92008 Other place in unspecified non-institutional (private) residence as the place of occurrence of the external cause: Secondary | ICD-10-CM | POA: Diagnosis not present

## 2016-04-17 DIAGNOSIS — W010XXA Fall on same level from slipping, tripping and stumbling without subsequent striking against object, initial encounter: Secondary | ICD-10-CM

## 2016-04-17 DIAGNOSIS — M1990 Unspecified osteoarthritis, unspecified site: Secondary | ICD-10-CM | POA: Diagnosis not present

## 2016-04-17 DIAGNOSIS — Z7951 Long term (current) use of inhaled steroids: Secondary | ICD-10-CM | POA: Diagnosis not present

## 2016-04-17 DIAGNOSIS — S0003XA Contusion of scalp, initial encounter: Secondary | ICD-10-CM | POA: Diagnosis not present

## 2016-04-17 NOTE — ED Notes (Addendum)
Pt states she was walking briskly and fell forward. Her right knee hit the concrete on the front porch, her head hit the brick. Pt denies loc or use of blood thinners.  Abrasion to right knee, c/o a little bit of a headache- no obvious swelling noted. Ice pack given for knee and head.

## 2016-04-17 NOTE — ED Provider Notes (Signed)
CSN: [id number]     Arrival date & time 04/17/16  2121 History  By signing my name below, I, Rowan Blase, attest that this documentation has been prepared under the direction and in the presence of non-physician practitioner, Abigail Butts, PA-C,. Electronically Signed: Rowan Blase, Scribe. 04/17/2016. 11:35 PM.    Chief Complaint  Patient presents with  . Fall   The history is provided by the patient, medical records and a significant other. No language interpreter was used.   HPI Comments:  Cindy [name] is a 65 y.o. female with PMHx of cancer, HLD, tendonitis and arthritis who presents to the Emergency Department complaining of worsening swelling, pain and abrasion to right knee s/p fall ~3.5 hours ago. Pt reports associated headache and nausea right after the fall, but no vision changes or vomiting. Pt was walking quickly up her front stairs when she tripped on the top step and fell forward, hitting her right knee on the concrete and her head on the brick siding. She has been able to ambulate without difficulty. Pt applied ice to her head and right knee PTA with mild relief. Denies syncope, use of blood thinners, vision changes, vomiting, or current nausea.  Past Medical History  Diagnosis Date  . Cancer ([place])     breast  . Depression   . Arthritis   . GERD (gastroesophageal reflux disease)   . Hyperlipidemia   . Tendonitis    Past Surgical History  Procedure Laterality Date  . Breast surgery      right  . Elbow surgery      right   Family History  Problem Relation Age of Onset  . Cancer Mother     bone  . Hypertension Father   . Diabetes Father   . Stroke Father   . Hypertension Sister   . Alzheimer's disease Brother   . Hypertension Sister    Social History  Substance Use Topics  . Smoking status: Never Smoker   . Smokeless tobacco: Never Used  . Alcohol Use: Yes     Comment: 1-2 glasses of wine a month   OB History    No data available      Review of Systems  Constitutional: Negative for fever and chills.  HENT: Negative for dental problem, facial swelling and nosebleeds.   Eyes: Negative for visual disturbance.  Respiratory: Negative for cough, chest tightness, shortness of breath, wheezing and stridor.   Cardiovascular: Negative for chest pain.  Gastrointestinal: Negative for nausea, vomiting and abdominal pain.  Genitourinary: Negative for dysuria, hematuria and flank pain.  Musculoskeletal: Positive for joint swelling and arthralgias. Negative for back pain, gait problem, neck pain and neck stiffness.  Skin: Positive for wound. Negative for rash.  Neurological: Negative for syncope, weakness, light-headedness, numbness and headaches.  Hematological: Does not bruise/bleed easily.  Psychiatric/Behavioral: The patient is not nervous/anxious.   All other systems reviewed and are negative.  Allergies  Review of patient's allergies indicates no known allergies.  Home Medications   Prior to Admission medications   Medication Sig Start Date End Date Taking? Authorizing Provider  buPROPion (WELLBUTRIN XL) 150 MG 24 hr tablet Take 3 tablets (450 mg total) by mouth daily. 02/24/16  Yes Dorena Cookey, MD  Calcium Carbonate-Vit D-Min (CALCIUM 1200 PO) Take 1 tablet by mouth daily.    Yes Historical Provider, MD  clonazePAM (KLONOPIN) 1 MG tablet Take 1 tablet (1 mg total) by mouth 2 (two) times daily. Patient taking differently: Take 1 mg  by mouth 2 (two) times daily as needed for anxiety.  02/24/16  Yes Dorena Cookey, MD  co-enzyme Q-10 30 MG capsule Take 30 mg by mouth daily.   Yes Historical Provider, MD  cyclobenzaprine (FLEXERIL) 10 MG tablet TAKE 1 TABLET BY MOUTH ONCE DAILY AT BEDTIME 12/25/14  Yes Dorena Cookey, MD  glucosamine-chondroitin 500-400 MG tablet Take 1 tablet by mouth daily.     Yes Historical Provider, MD  hydrocortisone (ANUSOL-HC) 25 MG suppository Place 1 suppository (25 mg total) rectally 2 (two) times  daily. 02/26/16  Yes Dorena Cookey, MD  hydrocortisone (PROCTOSOL HC) 2.5 % rectal cream Apply small amounts each bedtime Patient taking differently: Place 1 application rectally 2 (two) times daily as needed for hemorrhoids. Apply small amounts each bedtime 09/17/14  Yes Dorena Cookey, MD  lovastatin (MEVACOR) 20 MG tablet Take 1 tablet (20 mg total) by mouth at bedtime. 02/24/16  Yes Dorena Cookey, MD  Multiple Vitamin (MULTIVITAMIN) tablet Take 1 tablet by mouth daily.     Yes Historical Provider, MD  omeprazole (PRILOSEC) 40 MG capsule Take 1 capsule (40 mg total) by mouth daily. 02/24/16  Yes Dorena Cookey, MD  PARoxetine (PAXIL) 10 MG tablet Take 1 tablet (10 mg total) by mouth every morning. 02/24/16  Yes Dorena Cookey, MD  triamcinolone (KENALOG) 0.025 % ointment Apply topically 2 (two) times daily. Patient taking differently: Apply 1 application topically 2 (two) times daily as needed (for ezcema.).  10/19/13  Yes Dorena Cookey, MD  valACYclovir (VALTREX) 500 MG tablet Take 1 tablet (500 mg total) by mouth daily. Patient taking differently: Take 500 mg by mouth daily as needed (for cold sores.).  02/24/16  Yes Dorena Cookey, MD  ibuprofen (ADVIL,MOTRIN) 800 MG tablet Take 1 tablet (800 mg total) by mouth 3 (three) times daily. 04/18/16   Omer Puccinelli, PA-C   BP 147/73 mmHg  Pulse 69  Temp(Src) 98.5 F (36.9 C) (Oral)  Resp 20  Ht 5\' 8"  (1.727 m)  Wt 78.926 kg  BMI 26.46 kg/m2  SpO2 95% Physical Exam  Constitutional: She is oriented to person, place, and time. She appears well-developed and well-nourished. No distress.  HENT:  Head: Normocephalic.  Right Ear: Tympanic membrane, external ear and ear canal normal.  Left Ear: Tympanic membrane, external ear and ear canal normal.  Nose: Nose normal. No epistaxis. Right sinus exhibits no maxillary sinus tenderness and no frontal sinus tenderness. Left sinus exhibits no maxillary sinus tenderness and no frontal sinus tenderness.   Mouth/Throat: Uvula is midline, oropharynx is clear and moist and mucous membranes are normal. Mucous membranes are not pale and not cyanotic. No oropharyngeal exudate, posterior oropharyngeal edema, posterior oropharyngeal erythema or tonsillar abscesses.  Large contusion to the right lateral scalp without laceration; tender to palpation of this area but no step-off or deformity  Eyes: Conjunctivae and EOM are normal. Pupils are equal, round, and reactive to light.  Neck: Normal range of motion and full passive range of motion without pain. No spinous process tenderness and no muscular tenderness present. No rigidity. Normal range of motion present.  Full ROM without pain No midline cervical tenderness No crepitus, deformity or step-offs No paraspinal tenderness  Cardiovascular: Normal rate, regular rhythm, normal heart sounds and intact distal pulses.   No murmur heard. Pulses:      Radial pulses are 2+ on the right side, and 2+ on the left side.       Dorsalis  pedis pulses are 2+ on the right side, and 2+ on the left side.       Posterior tibial pulses are 2+ on the right side, and 2+ on the left side.  Pulmonary/Chest: Effort normal and breath sounds normal. No accessory muscle usage or stridor. No respiratory distress. She has no decreased breath sounds. She has no wheezes. She has no rhonchi. She has no rales. She exhibits no tenderness and no bony tenderness.  No contusions or ecchymosis No flail segment, crepitus or deformity Equal chest expansion  Abdominal: Soft. Normal appearance and bowel sounds are normal. There is no tenderness. There is no rigidity, no guarding and no CVA tenderness.  No contusions or ecchymosis Abd soft and nontender  Musculoskeletal: Normal range of motion.       Thoracic back: She exhibits normal range of motion.       Lumbar back: She exhibits normal range of motion.  Full range of motion of the T-spine and L-spine No tenderness to palpation of the  spinous processes of the T-spine or L-spine No crepitus, deformity or step-offs No tenderness to palpation of the paraspinous muscles of the L-spine Full range of motion of the right hip, knee and ankle Right knee with significant contusion to the patella but no abnormal patellar movement. Patellar tendon is intact.  Lymphadenopathy:    She has no cervical adenopathy.  Neurological: She is alert and oriented to person, place, and time. She has normal reflexes. No cranial nerve deficit. GCS eye subscore is 4. GCS verbal subscore is 5. GCS motor subscore is 6.  Reflex Scores:      Bicep reflexes are 2+ on the right side and 2+ on the left side.      Brachioradialis reflexes are 2+ on the right side and 2+ on the left side.      Patellar reflexes are 2+ on the right side and 2+ on the left side.      Achilles reflexes are 2+ on the right side and 2+ on the left side. Mental Status:  Alert, oriented, thought content appropriate, able to give a coherent history. Speech fluent without evidence of aphasia. Able to follow 2 step commands without difficulty.  Cranial Nerves:  II:  Peripheral visual fields grossly normal, pupils equal, round, reactive to light III,IV, VI: ptosis not present, extra-ocular motions intact bilaterally  V,VII: smile symmetric, facial light touch sensation equal VIII: hearing grossly normal to voice  X: uvula elevates symmetrically  XI: bilateral shoulder shrug symmetric and strong XII: midline tongue extension without fassiculations Motor:  Normal tone. 5/5 in upper and lower extremities bilaterally including strong and equal grip strength and dorsiflexion/plantar flexion Sensory: Pinprick and light touch normal in all extremities.  Deep Tendon Reflexes: 2+ and symmetric in the biceps and patella Cerebellar: normal finger-to-nose with bilateral upper extremities Gait: normal gait and balance CV: distal pulses palpable throughout   Skin: Skin is warm and dry. No rash  noted. She is not diaphoretic. No erythema.  Psychiatric: She has a normal mood and affect.  Nursing note and vitals reviewed.   ED Course  Procedures  DIAGNOSTIC STUDIES:  Oxygen Saturation is 100% on RA, normal by my interpretation.    COORDINATION OF CARE:  11:32 PM Will order head CT. Discussed treatment plan with pt at bedside and pt agreed to plan.  Labs Review Labs Reviewed - No data to display  Imaging Review Ct Head Wo Contrast  04/18/2016  CLINICAL DATA:  Pain after  fall. EXAM: CT HEAD WITHOUT CONTRAST CT CERVICAL SPINE WITHOUT CONTRAST TECHNIQUE: Multidetector CT imaging of the head and cervical spine was performed following the standard protocol without intravenous contrast. Multiplanar CT image reconstructions of the cervical spine were also generated. COMPARISON:  None. FINDINGS: CT HEAD FINDINGS The paranasal sinuses, mastoid air cells, bones, and extracranial soft tissues are normal. Cerebellum, brainstem, and basal cisterns are normal. No mass, mass effect, or midline shift. Ventricles and sulci are normal. No cortical ischemia or infarct. CT CERVICAL SPINE FINDINGS Mild reversal of normal lordosis with no traumatic malalignment. No fractures are identified. Mild multilevel degenerative changes. IMPRESSION: 1. No acute intracranial process. 2. No cervical spine fracture or malalignment. Electronically Signed   By: Dorise Bullion III M.D   On: 04/18/2016 00:40   Ct Cervical Spine Wo Contrast  04/18/2016  CLINICAL DATA:  Pain after fall. EXAM: CT HEAD WITHOUT CONTRAST CT CERVICAL SPINE WITHOUT CONTRAST TECHNIQUE: Multidetector CT imaging of the head and cervical spine was performed following the standard protocol without intravenous contrast. Multiplanar CT image reconstructions of the cervical spine were also generated. COMPARISON:  None. FINDINGS: CT HEAD FINDINGS The paranasal sinuses, mastoid air cells, bones, and extracranial soft tissues are normal. Cerebellum, brainstem,  and basal cisterns are normal. No mass, mass effect, or midline shift. Ventricles and sulci are normal. No cortical ischemia or infarct. CT CERVICAL SPINE FINDINGS Mild reversal of normal lordosis with no traumatic malalignment. No fractures are identified. Mild multilevel degenerative changes. IMPRESSION: 1. No acute intracranial process. 2. No cervical spine fracture or malalignment. Electronically Signed   By: Dorise Bullion III M.D   On: 04/18/2016 00:40   Dg Knee Complete 4 Views Right  04/17/2016  CLINICAL DATA:  Status post fall. EXAM: RIGHT KNEE - COMPLETE 4+ VIEW COMPARISON:  None. FINDINGS: There is no evidence of fracture, dislocation, or joint effusion. There is no evidence of arthropathy or other focal bone abnormality. Soft tissues are unremarkable. IMPRESSION: Negative. Electronically Signed   By: Dorise Bullion III M.D   On: 04/17/2016 22:11   I have personally reviewed and evaluated these images and lab results as part of my medical decision-making.    MDM   Final diagnoses:  Fall from slip, trip, or stumble, initial encounter   TREACY ANASTASIO presents after mechanical fall. No loss of consciousness. Initial but now resolved nausea. No vomiting or vision changes. Normal neurologic exam.  CT head without acute abnormalities. Right knee with contusion but no fracture. Patient is ambulatory here in the emergency department without difficulty.  Discussed potential for postconcussive syndrome.  Reasons to return to the emergency department given.  Patient and husband state understanding of plan including reasons to return to the emergency department and are in agreement with the plan for discharge.  I personally performed the services described in this documentation, which was scribed in my presence. The recorded information has been reviewed and is accurate.   Jarrett Soho Rylin Saez, PA-C 04/18/16 [place], MD 04/20/16 [phone]

## 2016-04-18 ENCOUNTER — Emergency Department (HOSPITAL_COMMUNITY): Payer: BLUE CROSS/BLUE SHIELD

## 2016-04-18 MED ORDER — IBUPROFEN 800 MG PO TABS: 800.0000 mg | ORAL_TABLET | Freq: Three times a day (TID) | ORAL | Status: DC

## 2016-04-18 NOTE — ED Notes (Signed)
Ice pack given and cup of ice given to pt.

## 2016-04-18 NOTE — Discharge Instructions (Signed)
1. Medications: ibuprofen as needed for pain, usual home medications 2. Treatment: rest, drink plenty of fluids, ice, elevate, use knee bracse 3. Follow Up: Please followup with your primary doctor in 2-3 days for discussion of your diagnoses and further evaluation after today's visit; if you do not have a primary care doctor use the resource guide provided to find one; Please return to the ER for worsening symptoms, seizures, confusion

## 2016-04-21 ENCOUNTER — Encounter: Payer: Self-pay | Admitting: Family Medicine

## 2016-04-21 ENCOUNTER — Ambulatory Visit (INDEPENDENT_AMBULATORY_CARE_PROVIDER_SITE_OTHER): Payer: Self-pay | Admitting: Family Medicine

## 2016-04-21 VITALS — BP 110/80 | Temp 98.8°F | Wt 178.0 lb

## 2016-04-21 DIAGNOSIS — S0003XD Contusion of scalp, subsequent encounter: Secondary | ICD-10-CM

## 2016-04-21 DIAGNOSIS — S8001XA Contusion of right knee, initial encounter: Secondary | ICD-10-CM | POA: Insufficient documentation

## 2016-04-21 DIAGNOSIS — S8001XD Contusion of right knee, subsequent encounter: Secondary | ICD-10-CM

## 2016-04-21 DIAGNOSIS — S0003XA Contusion of scalp, initial encounter: Secondary | ICD-10-CM | POA: Insufficient documentation

## 2016-04-21 NOTE — Progress Notes (Signed)
   Subjective:    Patient ID: Cindy [name], female    DOB: [birth year], 65 y.o.   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 She said she fell at home 3 days ago and hit her head and her right knee. No loss of consciousness. She had a fairly large hematoma therefore went to the emergency room.  Brain scan normal x-rays of knee normal. She was treated symptomatically with Tylenol and ice. She comes in today for follow-up with no complaints. The hematoma her scalp is mostly resolve the hematoma knee is mostly resolved also. No evidence of a secondary infection   Review of Systems Review of systems otherwise negative    Objective:   Physical Exam  Well-developed well-nourished female no acute distress vital signs stable she's afebrile examination right knee shows a healing contusion no evidence of any secondary infection. She also has a resolving hematoma right side of her skull.      Assessment & Plan:  Contusion right side of her head and contusion right knee........ Healing........ no evidence of a secondary infection subdural hematoma etc.  Advised return when necessary if she has any neurologic symptoms

## 2016-04-21 NOTE — Patient Instructions (Signed)
Call immediately if you have any evidence of any neurologic deficit  Return when necessary

## 2016-04-21 NOTE — Progress Notes (Signed)
Pre visit review using our clinic review tool, if applicable. No additional management support is needed unless otherwise documented below in the visit note. 

## 2016-05-05 ENCOUNTER — Other Ambulatory Visit: Payer: Self-pay | Admitting: Family

## 2016-06-09 LAB — HM MAMMOGRAPHY

## 2016-06-11 ENCOUNTER — Encounter: Payer: Self-pay | Admitting: Family Medicine

## 2016-06-11 ENCOUNTER — Encounter: Payer: Self-pay | Admitting: Hematology & Oncology

## 2016-06-25 MED FILL — PARoxetine HCL 10 MG TABS: 10 | 90 days supply | Qty: 90 | Fill #0

## 2016-06-25 MED FILL — OMEPRAZOLE DR 40 MG CAPSULE: 40 | 30 days supply | Qty: 30 | Fill #7

## 2016-06-25 MED FILL — BUPROPION HCL XL 150 MG TAB: 150 | 90 days supply | Qty: 270 | Fill #0

## 2016-06-25 MED FILL — VALACYCLOVIR HCL 500 MG TAB: 500 | 90 days supply | Qty: 90 | Fill #1 | Status: TO

## 2016-06-26 ENCOUNTER — Telehealth: Payer: Self-pay | Admitting: Family Medicine

## 2016-06-26 NOTE — Telephone Encounter (Signed)
Pt returned called not sure who it may have been.

## 2016-06-29 ENCOUNTER — Other Ambulatory Visit: Payer: Self-pay | Admitting: Family Medicine

## 2016-06-29 MED FILL — clonazePAM 1 MG TABS: 1 | 30 days supply | Qty: 60 | Fill #1

## 2016-07-01 MED FILL — LOVASTATIN 20 MG TABLET: 20 | 90 days supply | Qty: 90 | Fill #0

## 2016-07-06 ENCOUNTER — Other Ambulatory Visit: Payer: Self-pay | Admitting: Family Medicine

## 2016-07-20 MED FILL — OMEPRAZOLE DR 40 MG CAPSULE: 40 | 90 days supply | Qty: 90 | Fill #0

## 2016-09-10 ENCOUNTER — Ambulatory Visit (INDEPENDENT_AMBULATORY_CARE_PROVIDER_SITE_OTHER): Payer: BLUE CROSS/BLUE SHIELD

## 2016-09-10 DIAGNOSIS — Z23 Encounter for immunization: Secondary | ICD-10-CM

## 2016-09-10 MED FILL — PARoxetine HCL 10 MG TABS: 10 | 90 days supply | Qty: 90 | Fill #1 | Status: TO

## 2016-10-19 MED FILL — LOVASTATIN 20 MG TABLET: 20 | 90 days supply | Qty: 90 | Fill #1 | Status: TO

## 2016-11-20 MED FILL — BUPROPION HCL XL 150 MG TAB: 150 | 90 days supply | Qty: 270 | Fill #1 | Status: TO

## 2016-11-20 MED FILL — OMEPRAZOLE DR 40 MG CAPSULE: 40 | 90 days supply | Qty: 90 | Fill #1 | Status: TO

## 2016-11-23 ENCOUNTER — Other Ambulatory Visit: Payer: Self-pay | Admitting: Emergency Medicine

## 2016-11-23 MED ORDER — CLONAZEPAM 1 MG PO TABS: 1.0000 mg | ORAL_TABLET | Freq: Two times a day (BID) | ORAL | 5 refills | Status: DC

## 2016-11-23 MED FILL — clonazePAM 1 MG TABS: 1 | 30 days supply | Qty: 60 | Fill #0 | Status: TO

## 2016-11-23 NOTE — Telephone Encounter (Signed)
Rx approved verbally by Dr. Sherren Mocha MD. I called prescription into pharmacy.

## 2017-01-08 ENCOUNTER — Encounter: Payer: Self-pay | Admitting: Adult Health

## 2017-01-08 ENCOUNTER — Telehealth: Payer: Self-pay | Admitting: Family Medicine

## 2017-01-08 NOTE — Telephone Encounter (Signed)
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  No fever, in her bronchials, using musinex, vicks cough syrup. Pt has had this since 12/07/16. 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  Would like to know if Tommi Rumps will call in a zpak . Pt declined apt at this time.  Please advise.  [facility]

## 2017-01-08 NOTE — Telephone Encounter (Signed)
I am sorry but I would have to evaluate her first before antibiotics could be prescribed. Dr. Sherren Mocha will be back on Monday and if he feels comfortable doing it he can.

## 2017-01-08 NOTE — Telephone Encounter (Signed)
See below

## 2017-01-08 NOTE — Telephone Encounter (Signed)
Patient notified and verbalized understanding. 

## 2017-01-11 ENCOUNTER — Other Ambulatory Visit: Payer: Self-pay | Admitting: Emergency Medicine

## 2017-01-11 MED ORDER — HYDROCODONE-HOMATROPINE 5-1.5 MG/5ML PO SYRP: 5.0000 mL | ORAL_SOLUTION | Freq: Four times a day (QID) | ORAL | 0 refills | Status: DC | PRN

## 2017-01-11 NOTE — Telephone Encounter (Signed)
Pt prefers to ask Dr Sherren Mocha if he will call in something for a sinus infection.pt declined appt.  [facility]

## 2017-02-17 IMAGING — CR DG KNEE COMPLETE 4+V*R*
4 series · 4 of 4 positions shown · non-contrast
Comparison: None.

CLINICAL DATA: Status post fall.

EXAM:
RIGHT KNEE - COMPLETE 4+ VIEW

[x knee ap right (1 of 3)]
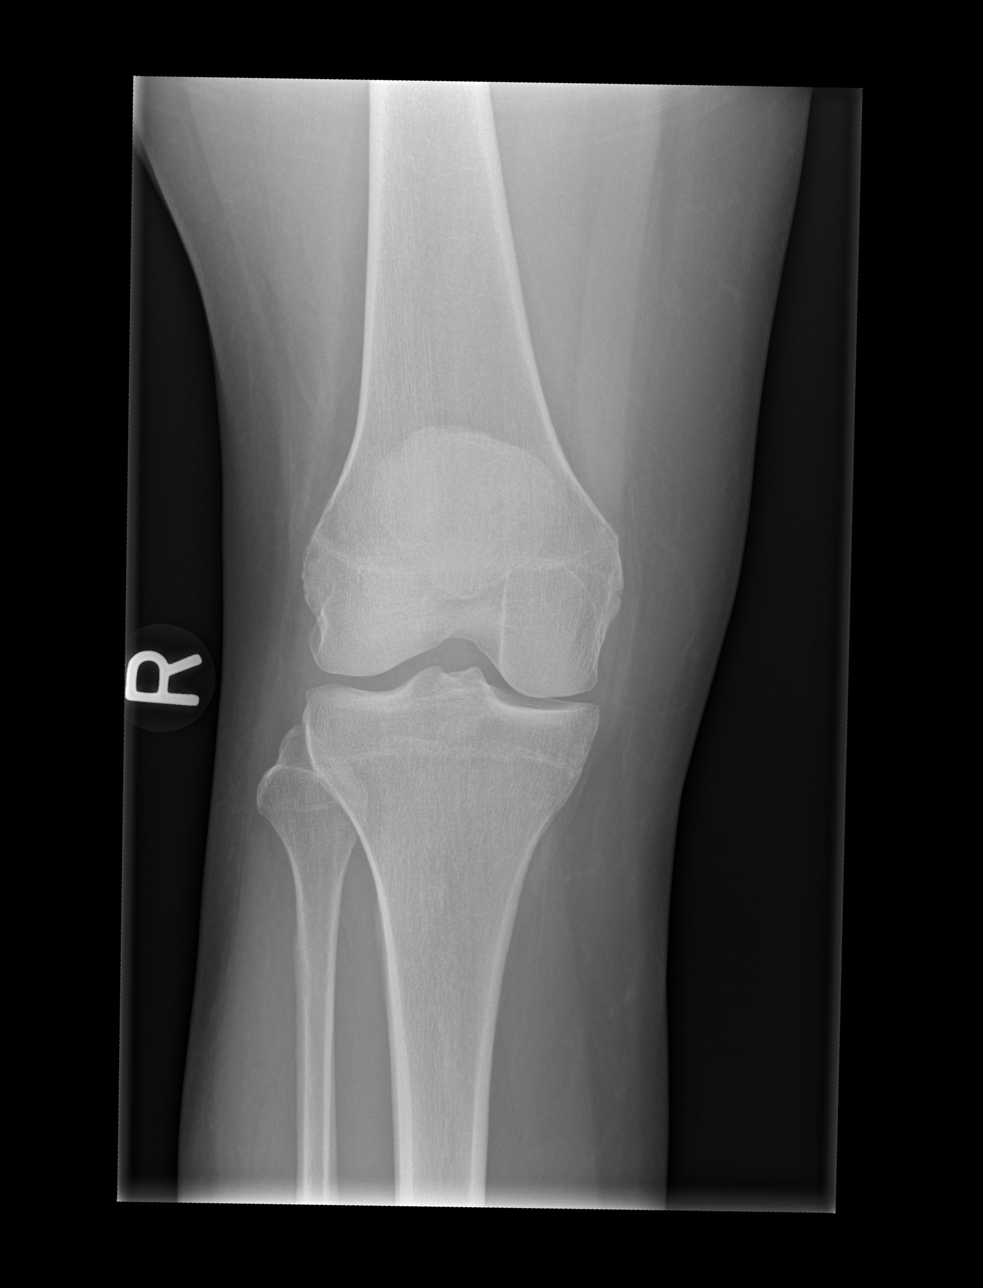

[x knee ap right (2 of 3)]
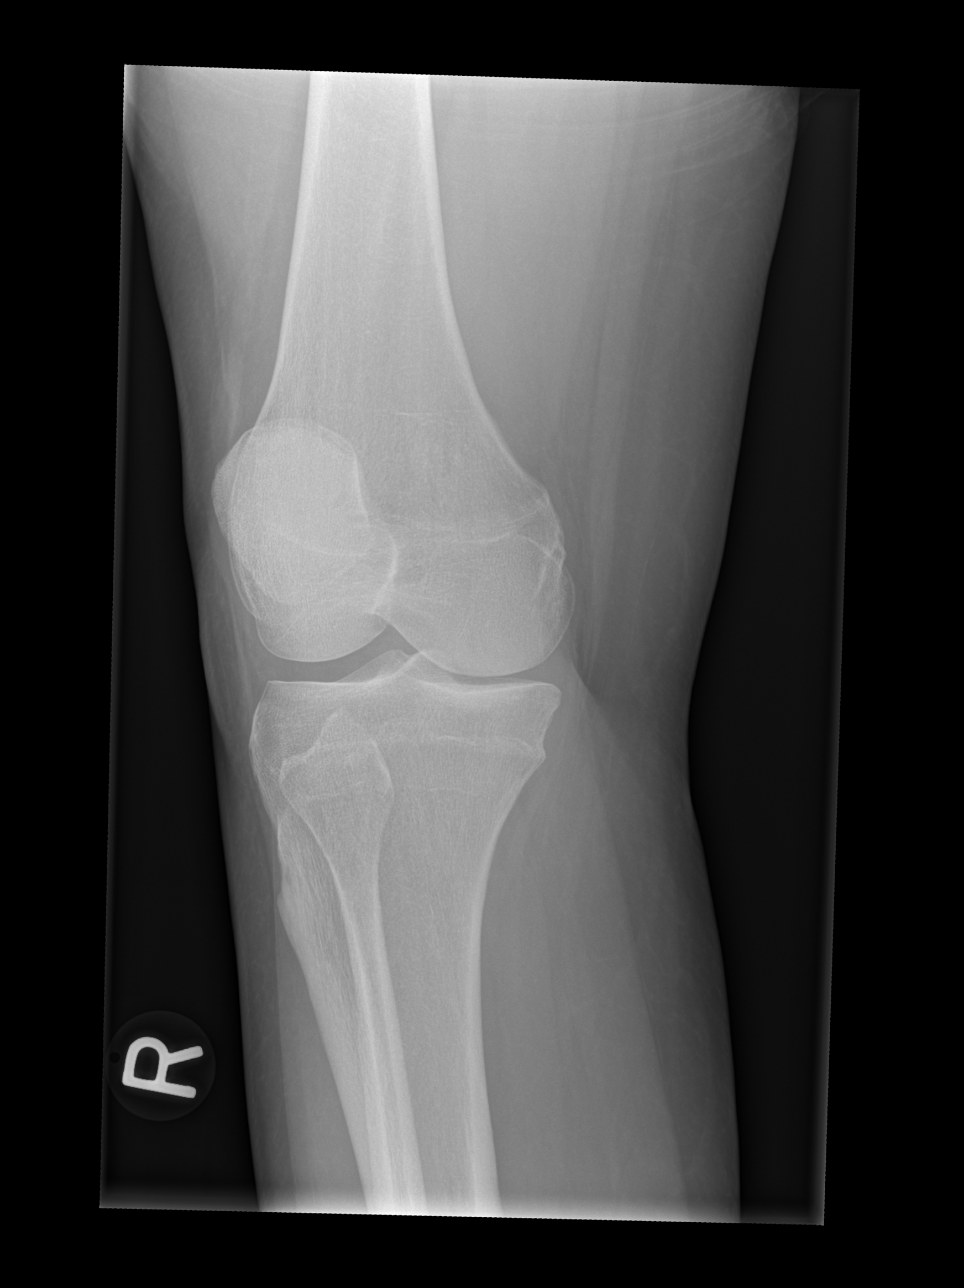

[x knee ap right (3 of 3)]
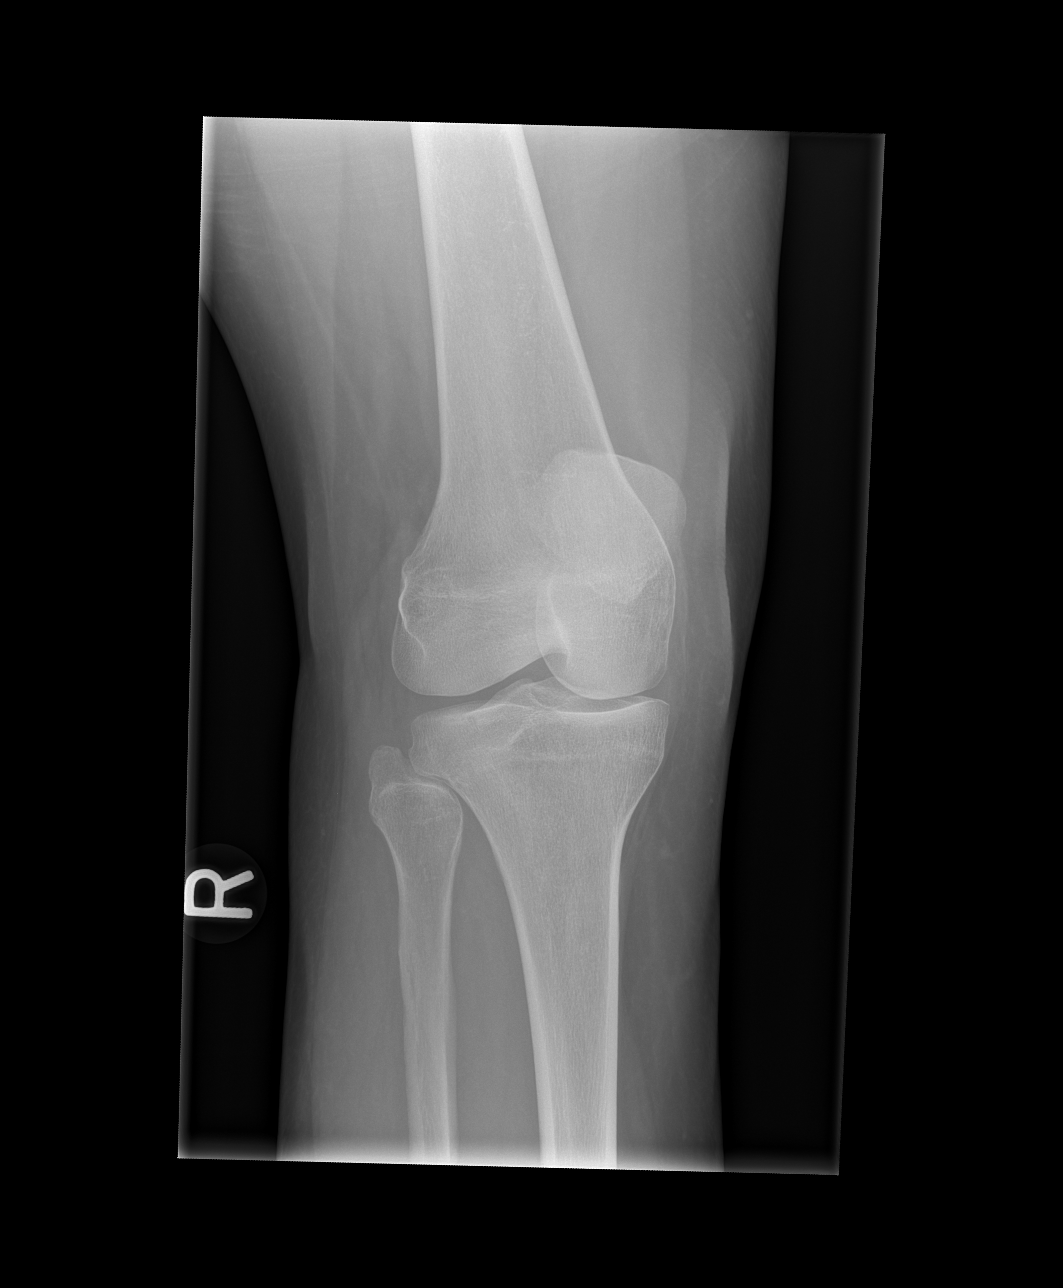

[x knee lat right]
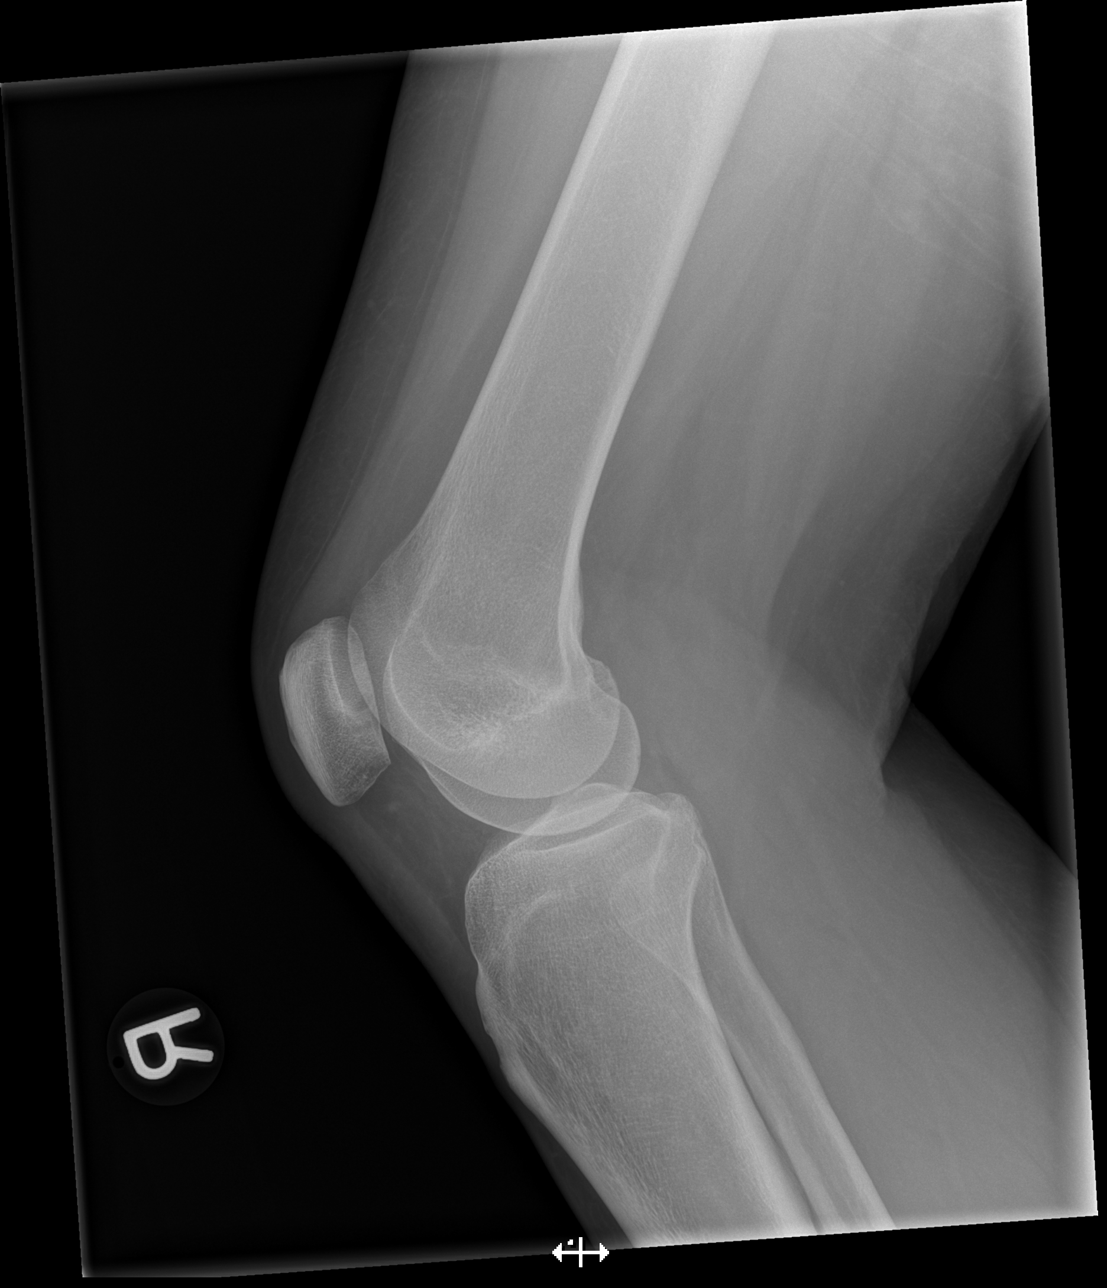

[4 of 4 positions shown; findings below may reference images not displayed]

FINDINGS: There is no evidence of fracture, dislocation, or joint effusion.
There is no evidence of arthropathy or other focal bone abnormality.
Soft tissues are unremarkable.
IMPRESSION: Negative.

## 2017-02-23 ENCOUNTER — Other Ambulatory Visit: Payer: BLUE CROSS/BLUE SHIELD

## 2017-03-02 ENCOUNTER — Encounter: Payer: BLUE CROSS/BLUE SHIELD | Admitting: Family Medicine

## 2017-03-06 ENCOUNTER — Other Ambulatory Visit: Payer: Self-pay | Admitting: Family Medicine

## 2017-03-08 ENCOUNTER — Telehealth: Payer: Self-pay | Admitting: Family Medicine

## 2017-03-08 NOTE — Telephone Encounter (Signed)
Pt now due for yearly.  Please help her establish with a new provider.  Dr. Sherren Mocha is now asking his yearly to go ahead and find a new provider.  I have filled her medication for 90 days.  Thanks!!

## 2017-03-08 NOTE — Telephone Encounter (Signed)
Sent to the pharmacy by e-scribe for 90 days.  Pt now due for yearly.  Message sent to scheduling to help her establish with another provider.

## 2017-03-09 NOTE — Telephone Encounter (Signed)
Pt has been sch

## 2017-03-30 ENCOUNTER — Encounter: Payer: Self-pay | Admitting: Family Medicine

## 2017-03-30 ENCOUNTER — Ambulatory Visit (INDEPENDENT_AMBULATORY_CARE_PROVIDER_SITE_OTHER): Payer: BLUE CROSS/BLUE SHIELD | Admitting: Family Medicine

## 2017-03-30 VITALS — BP 130/90 | Temp 98.1°F | Ht 67.0 in | Wt 175.0 lb

## 2017-03-30 DIAGNOSIS — K219 Gastro-esophageal reflux disease without esophagitis: Secondary | ICD-10-CM | POA: Diagnosis not present

## 2017-03-30 DIAGNOSIS — Z0000 Encounter for general adult medical examination without abnormal findings: Secondary | ICD-10-CM

## 2017-03-30 DIAGNOSIS — Z853 Personal history of malignant neoplasm of breast: Secondary | ICD-10-CM | POA: Diagnosis not present

## 2017-03-30 DIAGNOSIS — E7800 Pure hypercholesterolemia, unspecified: Secondary | ICD-10-CM

## 2017-03-30 DIAGNOSIS — F418 Other specified anxiety disorders: Secondary | ICD-10-CM | POA: Diagnosis not present

## 2017-03-30 LAB — CBC WITH DIFFERENTIAL/PLATELET
Basophils Absolute: 0 10*3/uL (ref 0.0–0.1)
Basophils Relative: 0.3 % (ref 0.0–3.0)
EOS ABS: 0.1 10*3/uL (ref 0.0–0.7)
EOS PCT: 1.1 % (ref 0.0–5.0)
HEMATOCRIT: 42.4 % (ref 36.0–46.0)
HEMOGLOBIN: 14 g/dL (ref 12.0–15.0)
LYMPHS PCT: 30.3 % (ref 12.0–46.0)
Lymphs Abs: 2.3 10*3/uL (ref 0.7–4.0)
MCHC: 33 g/dL (ref 30.0–36.0)
MCV: 86 fl (ref 78.0–100.0)
MONOS PCT: 7.7 % (ref 3.0–12.0)
Monocytes Absolute: 0.6 10*3/uL (ref 0.1–1.0)
Neutro Abs: 4.7 10*3/uL (ref 1.4–7.7)
Neutrophils Relative %: 60.6 % (ref 43.0–77.0)
Platelets: 251 10*3/uL (ref 150.0–400.0)
RBC: 4.93 Mil/uL (ref 3.87–5.11)
RDW: 14.7 % (ref 11.5–15.5)
WBC: 7.7 10*3/uL (ref 4.0–10.5)

## 2017-03-30 LAB — POCT URINALYSIS DIPSTICK
BILIRUBIN UA: NEGATIVE
Blood, UA: NEGATIVE
GLUCOSE UA: NEGATIVE
Ketones, UA: NEGATIVE
Nitrite, UA: NEGATIVE
Protein, UA: NEGATIVE
SPEC GRAV UA: 1.01 (ref 1.010–1.025)
UROBILINOGEN UA: 0.2 U/dL
pH, UA: 6 (ref 5.0–8.0)

## 2017-03-30 LAB — HEPATIC FUNCTION PANEL
ALT: 28 U/L (ref 0–35)
AST: 21 U/L (ref 0–37)
Albumin: 4.6 g/dL (ref 3.5–5.2)
Alkaline Phosphatase: 107 U/L (ref 39–117)
BILIRUBIN DIRECT: 0.1 mg/dL (ref 0.0–0.3)
BILIRUBIN TOTAL: 0.4 mg/dL (ref 0.2–1.2)
Total Protein: 7.4 g/dL (ref 6.0–8.3)

## 2017-03-30 LAB — BASIC METABOLIC PANEL
BUN: 9 mg/dL (ref 6–23)
CHLORIDE: 99 meq/L (ref 96–112)
CO2: 30 meq/L (ref 19–32)
CREATININE: 0.93 mg/dL (ref 0.40–1.20)
Calcium: 9.6 mg/dL (ref 8.4–10.5)
GFR: 64.46 mL/min (ref >60.00)
Glucose, Bld: 95 mg/dL (ref 70–99)
Potassium: 4.5 mEq/L (ref 3.5–5.1)
Sodium: 138 mEq/L (ref 135–145)

## 2017-03-30 LAB — TSH: TSH: 3.74 u[IU]/mL (ref 0.35–4.50)

## 2017-03-30 LAB — LIPID PANEL
CHOL/HDL RATIO: 3
Cholesterol: 218 mg/dL — ABNORMAL HIGH (ref 0–200)
HDL: 84.4 mg/dL (ref >39.00)
LDL CALC: 110 mg/dL — AB (ref 0–99)
NONHDL: 134
Triglycerides: 121 mg/dL (ref 0.0–149.0)
VLDL: 24.2 mg/dL (ref 0.0–40.0)

## 2017-03-30 MED ORDER — BUPROPION HCL ER (XL) 150 MG PO TB24: 450.0000 mg | ORAL_TABLET | Freq: Every day | ORAL | 4 refills | Status: DC

## 2017-03-30 MED ORDER — OMEPRAZOLE 40 MG PO CPDR: 40.0000 mg | DELAYED_RELEASE_CAPSULE | Freq: Every day | ORAL | 4 refills | Status: DC

## 2017-03-30 MED ORDER — PAROXETINE HCL 10 MG PO TABS: 10.0000 mg | ORAL_TABLET | Freq: Every morning | ORAL | 4 refills | Status: DC

## 2017-03-30 MED ORDER — LOVASTATIN 20 MG PO TABS: 20.0000 mg | ORAL_TABLET | Freq: Every day | ORAL | 4 refills | Status: DC

## 2017-03-30 MED ORDER — CLONAZEPAM 1 MG PO TABS: 1.0000 mg | ORAL_TABLET | Freq: Two times a day (BID) | ORAL | 4 refills | Status: DC

## 2017-03-30 MED ORDER — CYCLOBENZAPRINE HCL 10 MG PO TABS: 10.0000 mg | ORAL_TABLET | Freq: Every day | ORAL | 5 refills | Status: DC

## 2017-03-30 MED ORDER — ZOSTER VAC RECOMB ADJUVANTED 50 MCG/0.5ML IM SUSR: 0.5000 mL | Freq: Once | INTRAMUSCULAR | 1 refills | Status: AC

## 2017-03-30 NOTE — Patient Instructions (Signed)
Continue current medications   Remember to walk 30 minutes daily  Follow-up in one year sooner if any problems  Labs today............ I will call you if there is anything abnormal

## 2017-03-30 NOTE — Progress Notes (Signed)
Cindy [name] is a 66 year old married female nonsmoker who comes in today for general physical examination because of the following problems  She has history of mild depression takes Wellbutrin 450 mg daily and Klonopin 1 mg at bedtime  She also takes Paxil 10 mg daily.  She takes Mevacor 20 mg daily along with an aspirin tablet for hyperlipidemia  She takes Prilosec 40 mg daily for reflux esophagitis.  She takes Flexeril 10 mg at bedtime when necessary for neck and back pain.  She gets routine eye care, dental care, BSE monthly, and you mammography, colonoscopy 2012 is normal.  Vaccinations up-to-date information given on shingles  14 point review of systems reviewed otherwise negative  It's been 19 years since she was diagnosed via mammography with breast cancer. She underwent a lumpectomy and postop radiation. She's done well said no further complications. She does get annual mammography as noted above.  Last Pap smear was in 2016 normal. No history of any abnormal Paps therefore every 3 years.  Social history.......Marland Kitchen married lives........Marland Kitchen lives in [place]. Retired Marine scientist  Vs BP 130/90 (BP Location: Right Arm, Patient Position: Sitting, Cuff Size: Normal)   Temp 98.1 F (36.7 C) (Oral)   Ht 5\' 7"  (1.702 m)   Wt 175 lb (79.4 kg)   BMI 27.41 kg/m  Well-developed well-nourished female no acute distress examination HEENT were negative neck was supple thyroid is not enlarged no carotid bruits cardiopulmonary exam normal breast exam left breast normal right breast scar 12:00 and 3:00 from previous surgical procedures. Tattoos from previous radiation. No palpable abnormalities in either breast. Abdominal exam negative pelvic and rectal deferred extremities normal skin no peripheral pulses normal  #1 depression,,,,,,,,,, continue Wellbutrin and Paxil,,,,,,,,,, Klonopin daily at bedtime for sleep  Hyperlipidemia,,,,,,,, continue Mevacor and a baby aspirin  #3 reflux  esophagitis,,,,,,,,,,,,, continue Prilosec  #419 years status post breast cancer,,,,,,,, treated with lumpectomy and postop radiation,,,,,,,,, no recurrence routine follow-up  .

## 2017-03-30 NOTE — Progress Notes (Signed)
Pre visit review using our clinic review tool, if applicable. No additional management support is needed unless otherwise documented below in the visit note. 

## 2017-05-18 ENCOUNTER — Ambulatory Visit: Payer: BLUE CROSS/BLUE SHIELD | Admitting: Adult Health

## 2017-06-08 LAB — HM MAMMOGRAPHY

## 2017-06-09 ENCOUNTER — Other Ambulatory Visit: Payer: Self-pay | Admitting: Family Medicine

## 2017-06-09 ENCOUNTER — Encounter: Payer: Self-pay | Admitting: Family Medicine

## 2017-06-09 NOTE — Telephone Encounter (Signed)
OK to fill

## 2017-06-09 NOTE — Telephone Encounter (Signed)
Called to pharmacy and given to pharmacist.

## 2017-06-09 NOTE — Telephone Encounter (Signed)
Okay for 1 month refill 

## 2017-08-09 ENCOUNTER — Other Ambulatory Visit: Payer: Self-pay | Admitting: Family Medicine

## 2017-08-09 NOTE — Telephone Encounter (Signed)
Pt is up to date on her OV and Labs, pt requests for refills on her Clonazepam 1 mg last filled on 06/09/2017 for #60 with no refills. Please Advise if ok to refill

## 2017-08-09 NOTE — Telephone Encounter (Signed)
Okay for refill?  

## 2017-08-10 NOTE — Telephone Encounter (Signed)
Rx for Clonazepam 1 mg has been approved and phoned into pt pharmacy,Pt is aware.

## 2017-09-03 ENCOUNTER — Encounter: Payer: Self-pay | Admitting: Family Medicine

## 2017-09-14 ENCOUNTER — Other Ambulatory Visit: Payer: Self-pay | Admitting: Family Medicine

## 2017-10-11 ENCOUNTER — Other Ambulatory Visit: Payer: Self-pay | Admitting: Family Medicine

## 2017-10-11 MED FILL — SULFAMETHOXAZOLE/TMP DS TAB: 800-160 | 10 days supply | Qty: 20 | Fill #0

## 2017-11-19 ENCOUNTER — Other Ambulatory Visit: Payer: Self-pay | Admitting: Family Medicine

## 2017-11-23 ENCOUNTER — Telehealth: Payer: Self-pay | Admitting: Family Medicine

## 2017-11-23 NOTE — Telephone Encounter (Signed)
Pt  Is  Requesting  A  Refill  Of  Clonazepam    1  Mg

## 2017-11-23 NOTE — Telephone Encounter (Signed)
Copied from [place] [id number]. Topic: Quick Communication - Rx Refill/Question >> Nov 23, 2017 12:20 PM Vernona Rieger wrote: Has the patient contacted their pharmacy? Yes & they have not received the fax CLONAZEPAM 1mg , she is out.   (Agent: If no, request that the patient contact the pharmacy for the refill.)   Preferred Pharmacy (with phone number or street name): [facility]  Agent: Please be advised that RX refills may take up to 3 business days. We ask that you follow-up with your pharmacy.

## 2017-11-24 NOTE — Telephone Encounter (Signed)
Rx has been send to pt pharmacy # 60 with 5 refills

## 2017-11-24 NOTE — Telephone Encounter (Signed)
Rx was called in to pt pharmacy [facility] for refill.

## 2017-12-13 ENCOUNTER — Other Ambulatory Visit: Payer: Self-pay | Admitting: Family Medicine

## 2018-01-11 DIAGNOSIS — H2513 Age-related nuclear cataract, bilateral: Secondary | ICD-10-CM | POA: Insufficient documentation

## 2018-01-11 DIAGNOSIS — H0100A Unspecified blepharitis right eye, upper and lower eyelids: Secondary | ICD-10-CM | POA: Insufficient documentation

## 2018-01-11 DIAGNOSIS — H43811 Vitreous degeneration, right eye: Secondary | ICD-10-CM | POA: Insufficient documentation

## 2018-01-11 DIAGNOSIS — H5203 Hypermetropia, bilateral: Secondary | ICD-10-CM | POA: Insufficient documentation

## 2018-01-11 DIAGNOSIS — H1859 Other hereditary corneal dystrophies: Secondary | ICD-10-CM | POA: Diagnosis not present

## 2018-01-11 DIAGNOSIS — H0100B Unspecified blepharitis left eye, upper and lower eyelids: Secondary | ICD-10-CM | POA: Diagnosis not present

## 2018-01-11 DIAGNOSIS — H18529 Epithelial (juvenile) corneal dystrophy, unspecified eye: Secondary | ICD-10-CM | POA: Insufficient documentation

## 2018-03-21 ENCOUNTER — Ambulatory Visit: Payer: BLUE CROSS/BLUE SHIELD | Admitting: Family Medicine

## 2018-03-22 ENCOUNTER — Encounter: Payer: Self-pay | Admitting: Family Medicine

## 2018-03-22 ENCOUNTER — Ambulatory Visit (INDEPENDENT_AMBULATORY_CARE_PROVIDER_SITE_OTHER): Payer: Medicare Other | Admitting: Family Medicine

## 2018-03-22 VITALS — BP 120/80 | HR 78 | Temp 98.2°F | Wt 177.0 lb

## 2018-03-22 DIAGNOSIS — F418 Other specified anxiety disorders: Secondary | ICD-10-CM

## 2018-03-22 MED ORDER — PAROXETINE HCL 20 MG PO TABS: 20.0000 mg | ORAL_TABLET | Freq: Every day | ORAL | 4 refills | Status: DC

## 2018-03-22 NOTE — Patient Instructions (Signed)
Information given for you to call and get set up for you and on to see a marriage counselor

## 2018-03-22 NOTE — Progress Notes (Signed)
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 Caprice Beaver for depression. She's been doing well until the last couple months. The current problem is difficulty with communication with her husband. This is made her feel depressed and anxious. She detailed a lot of issues. I recommend she see a marriage Social worker.  #1 depression secondary to marital discord............ Information given to see a marriage counselor

## 2018-03-25 ENCOUNTER — Other Ambulatory Visit: Payer: Self-pay | Admitting: Family Medicine

## 2018-03-25 ENCOUNTER — Other Ambulatory Visit: Payer: Self-pay

## 2018-03-25 ENCOUNTER — Telehealth: Payer: Self-pay | Admitting: Family Medicine

## 2018-03-25 DIAGNOSIS — K602 Anal fissure, unspecified: Secondary | ICD-10-CM

## 2018-03-25 MED ORDER — HYDROCORTISONE 2.5 % RE CREA
TOPICAL_CREAM | RECTAL | 3 refills | Status: DC
Start: 2018-03-25 — End: 2019-08-29

## 2018-03-25 NOTE — Telephone Encounter (Signed)
Pt request for Rx Hydrocortisone rectal cream was sent to pt pharmacy.

## 2018-03-25 NOTE — Telephone Encounter (Signed)
Copied from [place] [id number]. Topic: Quick Communication - See Telephone Encounter >> Mar 25, 2018  9:33 AM Antonieta Iba C wrote: CRM for notification. See Telephone encounter for: 03/25/18.  Pt called in to request to speak Izora Gala. Pt wouldn't go into detail but stated that she just need to speak with providers assistant.   CB: [id number]

## 2018-04-05 ENCOUNTER — Encounter: Payer: Self-pay | Admitting: Family Medicine

## 2018-04-05 ENCOUNTER — Ambulatory Visit (INDEPENDENT_AMBULATORY_CARE_PROVIDER_SITE_OTHER): Payer: Medicare Other | Admitting: Family Medicine

## 2018-04-05 ENCOUNTER — Other Ambulatory Visit (HOSPITAL_COMMUNITY)
Admission: RE | Admit: 2018-04-05 | Discharge: 2018-04-05 | Disposition: A | Discharge status: Home / Self Care | Payer: Medicare Other | Source: Ambulatory Visit | Attending: Family Medicine | Admitting: Family Medicine

## 2018-04-05 VITALS — BP 118/82 | HR 74 | Temp 97.9°F | Ht 67.0 in | Wt 174.0 lb

## 2018-04-05 DIAGNOSIS — Z124 Encounter for screening for malignant neoplasm of cervix: Secondary | ICD-10-CM

## 2018-04-05 DIAGNOSIS — E7800 Pure hypercholesterolemia, unspecified: Secondary | ICD-10-CM

## 2018-04-05 DIAGNOSIS — Z23 Encounter for immunization: Secondary | ICD-10-CM | POA: Diagnosis not present

## 2018-04-05 DIAGNOSIS — E785 Hyperlipidemia, unspecified: Secondary | ICD-10-CM

## 2018-04-05 DIAGNOSIS — Z0000 Encounter for general adult medical examination without abnormal findings: Secondary | ICD-10-CM | POA: Diagnosis not present

## 2018-04-05 DIAGNOSIS — F418 Other specified anxiety disorders: Secondary | ICD-10-CM

## 2018-04-05 DIAGNOSIS — Z853 Personal history of malignant neoplasm of breast: Secondary | ICD-10-CM

## 2018-04-05 LAB — CBC WITH DIFFERENTIAL/PLATELET
BASOS PCT: 0.4 % (ref 0.0–3.0)
Basophils Absolute: 0 10*3/uL (ref 0.0–0.1)
EOS PCT: 0.9 % (ref 0.0–5.0)
Eosinophils Absolute: 0 10*3/uL (ref 0.0–0.7)
HCT: 38.9 % (ref 36.0–46.0)
Hemoglobin: 12.9 g/dL (ref 12.0–15.0)
Lymphocytes Relative: 38.9 % (ref 12.0–46.0)
Lymphs Abs: 2 10*3/uL (ref 0.7–4.0)
MCHC: 33.1 g/dL (ref 30.0–36.0)
MCV: 85.8 fl (ref 78.0–100.0)
MONO ABS: 0.4 10*3/uL (ref 0.1–1.0)
MONOS PCT: 7.5 % (ref 3.0–12.0)
NEUTROS PCT: 52.3 % (ref 43.0–77.0)
Neutro Abs: 2.7 10*3/uL (ref 1.4–7.7)
Platelets: 195 10*3/uL (ref 150.0–400.0)
RBC: 4.54 Mil/uL (ref 3.87–5.11)
RDW: 14.6 % (ref 11.5–15.5)
WBC: 5.2 10*3/uL (ref 4.0–10.5)

## 2018-04-05 LAB — LIPID PANEL
CHOL/HDL RATIO: 2
Cholesterol: 191 mg/dL (ref 0–200)
HDL: 82.4 mg/dL (ref >39.00)
LDL Cholesterol: 87 mg/dL (ref 0–99)
NONHDL: 108.75
Triglycerides: 109 mg/dL (ref 0.0–149.0)
VLDL: 21.8 mg/dL (ref 0.0–40.0)

## 2018-04-05 LAB — BASIC METABOLIC PANEL
BUN: 10 mg/dL (ref 6–23)
CHLORIDE: 104 meq/L (ref 96–112)
CO2: 32 meq/L (ref 19–32)
Calcium: 9.3 mg/dL (ref 8.4–10.5)
Creatinine, Ser: 0.87 mg/dL (ref 0.40–1.20)
GFR: 69.4 mL/min (ref >60.00)
GLUCOSE: 81 mg/dL (ref 70–99)
Potassium: 4.6 mEq/L (ref 3.5–5.1)
SODIUM: 141 meq/L (ref 135–145)

## 2018-04-05 LAB — TSH: TSH: 2.29 u[IU]/mL (ref 0.35–4.50)

## 2018-04-05 LAB — POCT URINALYSIS DIPSTICK
BILIRUBIN UA: NEGATIVE
Blood, UA: NEGATIVE
GLUCOSE UA: NEGATIVE
Ketones, UA: NEGATIVE
Nitrite, UA: NEGATIVE
Odor: NEGATIVE
Protein, UA: NEGATIVE
Spec Grav, UA: 1.015 (ref 1.010–1.025)
Urobilinogen, UA: 0.2 E.U./dL
pH, UA: 7.5 (ref 5.0–8.0)

## 2018-04-05 LAB — HEPATIC FUNCTION PANEL
ALBUMIN: 4.3 g/dL (ref 3.5–5.2)
ALK PHOS: 85 U/L (ref 39–117)
ALT: 15 U/L (ref 0–35)
AST: 15 U/L (ref 0–37)
BILIRUBIN DIRECT: 0.1 mg/dL (ref 0.0–0.3)
BILIRUBIN TOTAL: 0.4 mg/dL (ref 0.2–1.2)
Total Protein: 6.7 g/dL (ref 6.0–8.3)

## 2018-04-05 MED ORDER — SULFAMETHOXAZOLE-TRIMETHOPRIM 800-160 MG PO TABS: 1.0000 | ORAL_TABLET | Freq: Two times a day (BID) | ORAL | 1 refills | Status: DC

## 2018-04-05 MED ORDER — LOVASTATIN 20 MG PO TABS: 20.0000 mg | ORAL_TABLET | Freq: Every day | ORAL | 4 refills | Status: DC

## 2018-04-05 MED ORDER — CLONAZEPAM 1 MG PO TABS: 1.0000 mg | ORAL_TABLET | Freq: Two times a day (BID) | ORAL | 5 refills | Status: DC

## 2018-04-05 MED ORDER — PAROXETINE HCL 10 MG PO TABS: 10.0000 mg | ORAL_TABLET | Freq: Every morning | ORAL | 4 refills | Status: DC

## 2018-04-05 MED ORDER — BUPROPION HCL ER (XL) 150 MG PO TB24: 450.0000 mg | ORAL_TABLET | Freq: Every day | ORAL | 4 refills | Status: DC

## 2018-04-05 MED ORDER — VALACYCLOVIR HCL 500 MG PO TABS: 500.0000 mg | ORAL_TABLET | Freq: Every day | ORAL | 1 refills | Status: DC

## 2018-04-05 MED ORDER — PAROXETINE HCL 20 MG PO TABS: 20.0000 mg | ORAL_TABLET | Freq: Every day | ORAL | 4 refills | Status: DC

## 2018-04-05 MED ORDER — OMEPRAZOLE 40 MG PO CPDR: 40.0000 mg | DELAYED_RELEASE_CAPSULE | Freq: Every day | ORAL | 4 refills | Status: DC

## 2018-04-05 NOTE — Progress Notes (Signed)
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 We recently increased her Paxil to 30 mg daily. She's having marriage difficulty. I recommend she and her husband on see a marriage counselor  She takes Klonopin 1 mg daily at bedtime for sleep dysfunction  She takes Mevacor 20 mg daily because of a history of hyperlipidemia  She is a well-appearing when necessary for eczema  She uses Valtrex 500 mg daily when necessary for breakout of HSV-1  She gets routine eye care, dental care, and you mammography. It's been 10 years since she had breast cancer. She'll lumpectomy and postop radiation. She's done well no complications or recurrence.  Colonoscopy 2012 are normal  Vaccinations tetanus booster and Prevnar 13 given today.  She had a DEXA scan to Dr. Barbette Hair split she's not recalling when it was. She sold it was normal.  Cognitive function normal she walks daily home health safety reviewed no issues identified, no guns in the house, she does have a healthcare power of attorney and living well.  14 point review of systems in otherwise negative  EKG was done because a history of hyperlipidemia. EKG was normal and unchanged  Last Pap smear was 2016. She's had no difficulty with her Pap smears in the past. Therefore recommend Paps every 3 years.  She's had some left lower quadrant discomfort since she became constipated with a change in her medication. Advised once the constipation resolves than the discomfort in her abdomen should result also. If not she is to call us and we will get ultrasound upper abdomen.  BP 118/82 (BP Location: Left Arm, Patient Position: Sitting, Cuff Size: Normal)   Pulse 74   Temp 97.9 F (36.6 C) (Oral)   Ht 5\' 7"  (1.702 m)   Wt 174 lb (78.9 kg)   BMI 27.25 kg/m   Well-developed well-nourished female no acute distress vital signs stable she's afebrile HEENT were negative neck was supple thyroid not enlarged no carotid bruits. Cardiopulmonary exam normal. Breast exam breasts are symmetrical scar right breast 6:00 position from previous lumpectomy. Tattoos from previous radiation. No palpable masses. Abdominal exam was negative pelvic examination external genitalia within normal limits except for vaginal dryness. Pap smear was done bimanual exam negative rectum normal stool guaiac-negative extremities normal skin normal peripheral pulses normal  #1 healthy female  #2 hyperlipidemia.......... continue Mevacor and aspirin check labs  #3 history of depression........ continue current medications  #4 sleep dysfunction....... continue Klonopin daily at bedtime  #5 history of HSV-1...........Marland Kitchen refill acyclovir  #610 years status post right breast cancer......... continue BSE monthly and you mammography.

## 2018-04-05 NOTE — Patient Instructions (Addendum)
Labs today............ I will call you if his anything abnormal  Continue current meds  Check with Dr. Barbette Hair about your bone density  Call your insurance company finding where you can get the shingles vaccine.........Marland Kitchen remember to Tylenol 3 times a day for the first 2 days to indicate the side effects of the shingles vaccine  Today we gave you the Prevnar 13 and an update on your tetanus booster.Marland Kitchen

## 2018-04-06 LAB — HEPATITIS C ANTIBODY
HEP C AB: NONREACTIVE
SIGNAL TO CUT-OFF: 0.01 (ref <1.00)

## 2018-04-08 LAB — CYTOLOGY - PAP: DIAGNOSIS: NEGATIVE

## 2018-05-17 ENCOUNTER — Other Ambulatory Visit: Payer: Self-pay | Admitting: Family Medicine

## 2018-05-26 ENCOUNTER — Other Ambulatory Visit: Payer: Self-pay | Admitting: Family Medicine

## 2018-06-01 ENCOUNTER — Telehealth: Payer: Self-pay

## 2018-06-09 ENCOUNTER — Encounter: Payer: Self-pay | Admitting: Family Medicine

## 2018-06-09 ENCOUNTER — Telehealth: Payer: Self-pay

## 2018-06-09 DIAGNOSIS — Z853 Personal history of malignant neoplasm of breast: Secondary | ICD-10-CM | POA: Diagnosis not present

## 2018-06-09 DIAGNOSIS — R928 Other abnormal and inconclusive findings on diagnostic imaging of breast: Secondary | ICD-10-CM | POA: Diagnosis not present

## 2018-06-09 LAB — HM MAMMOGRAPHY

## 2018-06-09 NOTE — Telephone Encounter (Signed)
Spoke with pt clarified that she does not need an RX for shinrgrix and explained to get in touch with her insurance/pharmacy for more information.

## 2018-06-09 NOTE — Telephone Encounter (Signed)
Copied from [place] [phone]. Topic: Quick Communication - See Telephone Encounter >> Jun 09, 2018 11:29 AM Burchel, Abbi R wrote: See Telephone encounter for: 06/09/18.  Pt is calling to f/up on request for Shingrix Vaccine orders.  Please send to:  [id number] - [place], [place] [address] AT [address] [phone] (Phone) [phone] (Fax)     Pt is also requesting that orders be sent over to [facility] (Dr. Johnnette Gourd) for a bone density scan.  She is planning to have the scan on 06/20/18.

## 2018-06-10 NOTE — Telephone Encounter (Signed)
[facility] [place], state that no Rx is needed for the shingrix vaccine, Pharmacy states that pt will be requires to pay a co pay if needed. Spoke with pt and is aware that pharmacy does need an Rx for the immunization.

## 2018-06-20 ENCOUNTER — Encounter: Payer: Self-pay | Admitting: Family Medicine

## 2018-06-20 DIAGNOSIS — Z780 Asymptomatic menopausal state: Secondary | ICD-10-CM | POA: Diagnosis not present

## 2018-06-20 LAB — HM DEXA SCAN
HM Dexa Scan: NORMAL
HM Dexa Scan: NORMAL

## 2018-06-27 LAB — HM DEXA SCAN: HM Dexa Scan: NORMAL

## 2018-07-09 DIAGNOSIS — M79671 Pain in right foot: Secondary | ICD-10-CM | POA: Diagnosis not present

## 2018-08-09 ENCOUNTER — Other Ambulatory Visit: Payer: Self-pay | Admitting: Family Medicine

## 2018-09-16 ENCOUNTER — Telehealth: Payer: Self-pay

## 2018-09-16 NOTE — Telephone Encounter (Signed)
Copied from [place] [id number]. Topic: General - Other >> Sep 15, 2018  1:38 PM Cindy [name] wrote: Reason for CRM: patient called in wanting to get [name] personal recommendation on [name] PCP once Dr. Sherren Mocha leaves. She says she has been [name] patient of his for over 30 yrs, and would definitely love his input on who he feels she and her husband should see going forward.    Please advise

## 2018-09-20 ENCOUNTER — Ambulatory Visit: Payer: Medicare Other | Admitting: Family Medicine

## 2018-09-25 ENCOUNTER — Encounter: Payer: Self-pay | Admitting: Family Medicine

## 2018-09-28 ENCOUNTER — Other Ambulatory Visit: Payer: Self-pay | Admitting: Family Medicine

## 2018-09-28 MED ORDER — HYDROCODONE-HOMATROPINE 5-1.5 MG/5ML PO SYRP: 5.0000 mL | ORAL_SOLUTION | Freq: Three times a day (TID) | ORAL | 0 refills | Status: DC | PRN

## 2018-10-17 ENCOUNTER — Ambulatory Visit (INDEPENDENT_AMBULATORY_CARE_PROVIDER_SITE_OTHER): Payer: Medicare Other | Admitting: Family Medicine

## 2018-10-17 ENCOUNTER — Encounter: Payer: Self-pay | Admitting: Family Medicine

## 2018-10-17 VITALS — BP 120/80 | HR 87 | Temp 98.1°F | Ht 67.0 in | Wt 177.0 lb

## 2018-10-17 DIAGNOSIS — J0110 Acute frontal sinusitis, unspecified: Secondary | ICD-10-CM

## 2018-10-17 DIAGNOSIS — J329 Chronic sinusitis, unspecified: Secondary | ICD-10-CM | POA: Insufficient documentation

## 2018-10-17 DIAGNOSIS — F418 Other specified anxiety disorders: Secondary | ICD-10-CM

## 2018-10-17 DIAGNOSIS — E7800 Pure hypercholesterolemia, unspecified: Secondary | ICD-10-CM | POA: Diagnosis not present

## 2018-10-17 MED ORDER — DOXYCYCLINE HYCLATE 100 MG PO TABS: 100.0000 mg | ORAL_TABLET | Freq: Two times a day (BID) | ORAL | 0 refills | Status: DC

## 2018-10-17 NOTE — Assessment & Plan Note (Signed)
-  Lipids have been well controlled with mevacor and is tolerating well.  Recommend continuation.

## 2018-10-17 NOTE — Assessment & Plan Note (Signed)
-  Stable with current medications, recommend continuation.

## 2018-10-17 NOTE — Assessment & Plan Note (Signed)
-  Continue supportive care -Remain well hydrated.  -Discussed if not improving within the next few days to initiate doxycycline, rx sent in.

## 2018-10-17 NOTE — Patient Instructions (Signed)
It was very nice to meet you today! Start doxycycline if not improving over the next couple of days. I hop you feel better soon!   See you in April!   Sinusitis, Adult Sinusitis is soreness and inflammation of your sinuses. Sinuses are hollow spaces in the bones around your face. They are located:  Around your eyes.  In the middle of your forehead.  Behind your nose.  In your cheekbones.  Your sinuses and nasal passages are lined with a stringy fluid (mucus). Mucus normally drains out of your sinuses. When your nasal tissues get inflamed or swollen, the mucus can get trapped or blocked so air cannot flow through your sinuses. This lets bacteria, viruses, and funguses grow, and that leads to infection. Follow these instructions at home: Medicines  Take, use, or apply over-the-counter and prescription medicines only as told by your doctor. These may include nasal sprays.  If you were prescribed an antibiotic medicine, take it as told by your doctor. Do not stop taking the antibiotic even if you start to feel better. Hydrate and Humidify  Drink enough water to keep your pee (urine) clear or pale yellow.  Use a cool mist humidifier to keep the humidity level in your home above 50%.  Breathe in steam for 10-15 minutes, 3-4 times a day or as told by your doctor. You can do this in the bathroom while a hot shower is running.  Try not to spend time in cool or dry air. Rest  Rest as much as possible.  Sleep with your head raised (elevated).  Make sure to get enough sleep each night. General instructions  Put a warm, moist washcloth on your face 3-4 times a day or as told by your doctor. This will help with discomfort.  Wash your hands often with soap and water. If there is no soap and water, use hand sanitizer.  Do not smoke. Avoid being around people who are smoking (secondhand smoke).  Keep all follow-up visits as told by your doctor. This is important. Contact a doctor  if:  You have a fever.  Your symptoms get worse.  Your symptoms do not get better within 10 days. Get help right away if:  You have a very bad headache.  You cannot stop throwing up (vomiting).  You have pain or swelling around your face or eyes.  You have trouble seeing.  You feel confused.  Your neck is stiff.  You have trouble breathing. This information is not intended to replace advice given to you by your health care provider. Make sure you discuss any questions you have with your health care provider. Document Released: 05/18/2008 Document Revised: 07/26/2016 Document Reviewed: 09/25/2015 Elsevier Interactive Patient Education  [employer].

## 2018-10-17 NOTE — Progress Notes (Signed)
Cindy [name] - 67 y.o. female MRN [id number]  Date of birth: 11-08-1951  Subjective Chief Complaint  Patient presents with  . Sinus Problem    HPI Cindy [name] is a 67 y.o. female with history of depression and anxiety, HLD and breast cancer here today to establish with new provider and complaint of sinusitis.   She reports that sinus symptoms began about 2 weeks ago with congestion, cough, and sinus pressure and have continued.  These symptoms have not really gotten any worse.  She has been using OTC medication as needed as well as hycodan cough syrup sparingly as needed in the evening.  She denies shortness of breath, wheezing, fever, chills, nausea or vomiting, body aches or chills.     She reports her other chronic medical conditions are well controlled.  She is stable with paxil and wellbutrin at current dose.  She also use clonazepam daily in the evenings for anxiety.  She does not need refills on these medications at this time.   She continues to tolerate mevacor well for her cholesterol.  She denies myalgias.   Lab Results  Component Value Date   LDLCALC 87 04/05/2018   ROS:  A comprehensive ROS was completed and negative except as noted per HPI  No Known Allergies  Past Medical History:  Diagnosis Date  . Arthritis   . Cancer ([place])    breast  . Depression   . GERD (gastroesophageal reflux disease)   . Hyperlipidemia   . Tendonitis     Past Surgical History:  Procedure Laterality Date  . BREAST SURGERY     right  . ELBOW SURGERY     right    Social History   Socioeconomic History  . Marital status: Married    Spouse name: Not on file  . Number of children: Not on file  . Years of education: Not on file  . Highest education level: Not on file  Occupational History  . Not on file  Social Needs  . Financial resource strain: Not on file  . Food insecurity:    Worry: Not on file    Inability: Not on file  . Transportation needs:    Medical: Not on  file    Non-medical: Not on file  Tobacco Use  . Smoking status: Never Smoker  . Smokeless tobacco: Never Used  Substance and Sexual Activity  . Alcohol use: Yes    Comment: 1-2 glasses of wine a month  . Drug use: No  . Sexual activity: Not on file  Lifestyle  . Physical activity:    Days per week: Not on file    Minutes per session: Not on file  . Stress: Not on file  Relationships  . Social connections:    Talks on phone: Not on file    Gets together: Not on file    Attends religious service: Not on file    Active member of club or organization: Not on file    Attends meetings of clubs or organizations: Not on file    Relationship status: Not on file  Other Topics Concern  . Not on file  Social History Narrative  . Not on file    Family History  Problem Relation Age of Onset  . Cancer Mother        bone  . Hypertension Father   . Diabetes Father   . Stroke Father   . Hypertension Sister   . Alzheimer's disease Brother   .  Hypertension Sister     Health Maintenance  Topic Date Due  . HIV Screening  03/28/2019 (Originally 01/05/1966)  . PNA vac Low Risk Adult (2 of 2 - PPSV23) 04/06/2019  . MAMMOGRAM  06/10/2019  . PAP SMEAR  04/05/2021  . COLONOSCOPY  11/16/2021  . TETANUS/TDAP  04/05/2028  . INFLUENZA VACCINE  Completed  . DEXA SCAN  Completed  . Hepatitis C Screening  Completed    ----------------------------------------------------------------------------------------------------------------------------------------------------------------------------------------------------------------- Physical Exam BP 120/80   Pulse 87   Temp 98.1 F (36.7 C) (Oral)   Ht 5\' 7"  (1.702 m)   Wt 177 lb (80.3 kg)   SpO2 98%   BMI 27.72 kg/m   Physical Exam  Constitutional: She is oriented to person, place, and time. She appears well-nourished. No distress.  HENT:  Head: Normocephalic and atraumatic.  Mouth/Throat: Oropharynx is clear and moist.  Frontal sinus  tenderness  Eyes: No scleral icterus.  Neck: Neck supple. No thyromegaly present.  Cardiovascular: Normal rate, regular rhythm and normal heart sounds.  Pulmonary/Chest: Effort normal and breath sounds normal.  Neurological: She is alert and oriented to person, place, and time.  Skin: No rash noted.  Psychiatric: She has a normal mood and affect. Her behavior is normal.    ------------------------------------------------------------------------------------------------------------------------------------------------------------------------------------------------------------------- Assessment and Plan  Sinusitis -Continue supportive care -Remain well hydrated.  -Discussed if not improving within the next few days to initiate doxycycline, rx sent in.    Depression with anxiety -Stable with current medications, recommend continuation.   Hyperlipidemia -Lipids have been well controlled with mevacor and is tolerating well.  Recommend continuation.

## 2018-11-14 DIAGNOSIS — H1131 Conjunctival hemorrhage, right eye: Secondary | ICD-10-CM | POA: Diagnosis not present

## 2018-11-14 DIAGNOSIS — B308 Other viral conjunctivitis: Secondary | ICD-10-CM | POA: Diagnosis not present

## 2018-11-21 DIAGNOSIS — B308 Other viral conjunctivitis: Secondary | ICD-10-CM | POA: Diagnosis not present

## 2018-11-21 DIAGNOSIS — H04123 Dry eye syndrome of bilateral lacrimal glands: Secondary | ICD-10-CM | POA: Diagnosis not present

## 2018-11-21 DIAGNOSIS — H0100A Unspecified blepharitis right eye, upper and lower eyelids: Secondary | ICD-10-CM | POA: Diagnosis not present

## 2018-11-21 DIAGNOSIS — H1131 Conjunctival hemorrhage, right eye: Secondary | ICD-10-CM | POA: Diagnosis not present

## 2018-11-21 DIAGNOSIS — H0100B Unspecified blepharitis left eye, upper and lower eyelids: Secondary | ICD-10-CM | POA: Diagnosis not present

## 2018-12-29 ENCOUNTER — Other Ambulatory Visit: Payer: Self-pay | Admitting: Family Medicine

## 2019-01-16 DIAGNOSIS — H0288B Meibomian gland dysfunction left eye, upper and lower eyelids: Secondary | ICD-10-CM | POA: Diagnosis not present

## 2019-01-16 DIAGNOSIS — H5203 Hypermetropia, bilateral: Secondary | ICD-10-CM | POA: Diagnosis not present

## 2019-01-16 DIAGNOSIS — H02831 Dermatochalasis of right upper eyelid: Secondary | ICD-10-CM | POA: Diagnosis not present

## 2019-01-16 DIAGNOSIS — H0100B Unspecified blepharitis left eye, upper and lower eyelids: Secondary | ICD-10-CM | POA: Diagnosis not present

## 2019-01-16 DIAGNOSIS — H524 Presbyopia: Secondary | ICD-10-CM | POA: Diagnosis not present

## 2019-01-16 DIAGNOSIS — H0100A Unspecified blepharitis right eye, upper and lower eyelids: Secondary | ICD-10-CM | POA: Diagnosis not present

## 2019-01-16 DIAGNOSIS — H2513 Age-related nuclear cataract, bilateral: Secondary | ICD-10-CM | POA: Diagnosis not present

## 2019-01-16 DIAGNOSIS — H0288A Meibomian gland dysfunction right eye, upper and lower eyelids: Secondary | ICD-10-CM | POA: Diagnosis not present

## 2019-01-16 DIAGNOSIS — H43813 Vitreous degeneration, bilateral: Secondary | ICD-10-CM | POA: Diagnosis not present

## 2019-01-16 DIAGNOSIS — H11153 Pinguecula, bilateral: Secondary | ICD-10-CM | POA: Diagnosis not present

## 2019-01-16 DIAGNOSIS — H04123 Dry eye syndrome of bilateral lacrimal glands: Secondary | ICD-10-CM | POA: Diagnosis not present

## 2019-01-16 DIAGNOSIS — H02834 Dermatochalasis of left upper eyelid: Secondary | ICD-10-CM | POA: Diagnosis not present

## 2019-01-24 ENCOUNTER — Other Ambulatory Visit: Payer: Self-pay | Admitting: Family Medicine

## 2019-01-25 NOTE — Telephone Encounter (Signed)
OK to fill? Last filled Dr. Sherren Mocha 09/26/18

## 2019-03-26 ENCOUNTER — Emergency Department (HOSPITAL_BASED_OUTPATIENT_CLINIC_OR_DEPARTMENT_OTHER)
Admission: EM | Admit: 2019-03-26 | Discharge: 2019-03-26 | Disposition: A | Discharge status: Home / Self Care | Payer: Medicare Other | Attending: Emergency Medicine | Admitting: Emergency Medicine

## 2019-03-26 ENCOUNTER — Other Ambulatory Visit: Payer: Self-pay

## 2019-03-26 ENCOUNTER — Emergency Department (HOSPITAL_BASED_OUTPATIENT_CLINIC_OR_DEPARTMENT_OTHER): Payer: Medicare Other

## 2019-03-26 ENCOUNTER — Encounter (HOSPITAL_BASED_OUTPATIENT_CLINIC_OR_DEPARTMENT_OTHER): Payer: Self-pay | Admitting: Emergency Medicine

## 2019-03-26 DIAGNOSIS — R112 Nausea with vomiting, unspecified: Secondary | ICD-10-CM | POA: Diagnosis not present

## 2019-03-26 DIAGNOSIS — R197 Diarrhea, unspecified: Secondary | ICD-10-CM | POA: Insufficient documentation

## 2019-03-26 DIAGNOSIS — R1032 Left lower quadrant pain: Secondary | ICD-10-CM | POA: Insufficient documentation

## 2019-03-26 DIAGNOSIS — K529 Noninfective gastroenteritis and colitis, unspecified: Secondary | ICD-10-CM | POA: Insufficient documentation

## 2019-03-26 DIAGNOSIS — R195 Other fecal abnormalities: Secondary | ICD-10-CM | POA: Insufficient documentation

## 2019-03-26 DIAGNOSIS — R109 Unspecified abdominal pain: Secondary | ICD-10-CM | POA: Diagnosis present

## 2019-03-26 DIAGNOSIS — R1110 Vomiting, unspecified: Secondary | ICD-10-CM | POA: Diagnosis not present

## 2019-03-26 LAB — CBC WITH DIFFERENTIAL/PLATELET
Abs Immature Granulocytes: 0.02 10*3/uL (ref 0.00–0.07)
Basophils Absolute: 0 10*3/uL (ref 0.0–0.1)
Basophils Relative: 0 %
Eosinophils Absolute: 0 10*3/uL (ref 0.0–0.5)
Eosinophils Relative: 0 %
HCT: 42.2 % (ref 36.0–46.0)
Hemoglobin: 13.8 g/dL (ref 12.0–15.0)
Immature Granulocytes: 0 %
Lymphocytes Relative: 19 %
Lymphs Abs: 1.6 10*3/uL (ref 0.7–4.0)
MCH: 28.3 pg (ref 26.0–34.0)
MCHC: 32.7 g/dL (ref 30.0–36.0)
MCV: 86.7 fL (ref 80.0–100.0)
Monocytes Absolute: 0.6 10*3/uL (ref 0.1–1.0)
Monocytes Relative: 7 %
Neutro Abs: 6.4 10*3/uL (ref 1.7–7.7)
Neutrophils Relative %: 74 %
Platelets: 228 10*3/uL (ref 150–400)
RBC: 4.87 MIL/uL (ref 3.87–5.11)
RDW: 14.6 % (ref 11.5–15.5)
WBC: 8.7 10*3/uL (ref 4.0–10.5)
nRBC: 0 % (ref 0.0–0.2)

## 2019-03-26 LAB — COMPREHENSIVE METABOLIC PANEL
ALT: 22 U/L (ref 0–44)
AST: 25 U/L (ref 15–41)
Albumin: 4.5 g/dL (ref 3.5–5.0)
Alkaline Phosphatase: 90 U/L (ref 38–126)
Anion gap: 8 (ref 5–15)
BUN: 9 mg/dL (ref 8–23)
CO2: 22 mmol/L (ref 22–32)
Calcium: 8.9 mg/dL (ref 8.9–10.3)
Chloride: 104 mmol/L (ref 98–111)
Creatinine, Ser: 0.88 mg/dL (ref 0.44–1.00)
GFR calc Af Amer: >60 mL/min (ref >60)
GFR calc non Af Amer: >60 mL/min (ref >60)
Glucose, Bld: 111 mg/dL — ABNORMAL HIGH (ref 70–99)
Potassium: 3.6 mmol/L (ref 3.5–5.1)
Sodium: 134 mmol/L — ABNORMAL LOW (ref 135–145)
Total Bilirubin: 0.7 mg/dL (ref 0.3–1.2)
Total Protein: 7.3 g/dL (ref 6.5–8.1)

## 2019-03-26 LAB — URINALYSIS, ROUTINE W REFLEX MICROSCOPIC
Bilirubin Urine: NEGATIVE
Glucose, UA: NEGATIVE mg/dL
Hgb urine dipstick: NEGATIVE
Ketones, ur: NEGATIVE mg/dL
Leukocytes,Ua: NEGATIVE
Nitrite: NEGATIVE
Protein, ur: NEGATIVE mg/dL
Specific Gravity, Urine: <1.005 — ABNORMAL LOW (ref 1.005–1.030)
pH: 7 (ref 5.0–8.0)

## 2019-03-26 LAB — LIPASE, BLOOD: Lipase: 35 U/L (ref 11–51)

## 2019-03-26 MED ORDER — SODIUM CHLORIDE 0.9 % IV BOLUS
500.0000 mL | Freq: Once | INTRAVENOUS | Status: AC
  Administered 2019-03-26: 500 mL via INTRAVENOUS

## 2019-03-26 MED ORDER — ONDANSETRON 4 MG PO TBDP: 4.0000 mg | ORAL_TABLET | Freq: Three times a day (TID) | ORAL | 0 refills | Status: DC | PRN

## 2019-03-26 MED ORDER — IOHEXOL 300 MG/ML  SOLN
100.0000 mL | Freq: Once | INTRAMUSCULAR | Status: AC | PRN
  Administered 2019-03-26: 100 mL via INTRAVENOUS

## 2019-03-26 MED ORDER — DICYCLOMINE HCL 20 MG PO TABS: 20.0000 mg | ORAL_TABLET | Freq: Three times a day (TID) | ORAL | 0 refills | Status: DC | PRN

## 2019-03-26 MED ORDER — ONDANSETRON HCL 4 MG/2ML IJ SOLN
4.0000 mg | Freq: Once | INTRAMUSCULAR | Status: AC
  Administered 2019-03-26: 19:00:00 4 mg via INTRAVENOUS
  Filled 2019-03-26: qty 2

## 2019-03-26 MED ORDER — AZITHROMYCIN 500 MG PO TABS: 500.0000 mg | ORAL_TABLET | Freq: Every day | ORAL | 0 refills | Status: AC

## 2019-03-26 NOTE — Discharge Instructions (Signed)
You were seen in the ED today with abdominal pain. You were found to have colitis. I am starting an antibiotic for 3 days. Take as directed and follow up with your PCP. Return to the ED with any new or worsening symptoms including worsening pain, fever, chills, or uncontrollable vomiting/diarrhea.

## 2019-03-26 NOTE — ED Provider Notes (Signed)
Emergency Department Provider Note   I have reviewed the triage vital signs and the nursing notes.   HISTORY  Chief Complaint Abdominal Pain and Blood In Stools   HPI MICHEAL SHEEN is a 68 y.o. female with PMH of HLD, GERD, and Depression presents to the emergency department for evaluation of multiple episodes of vomiting, abdominal pain, diarrhea with some blood.  Symptoms began today at approximately 11:30 AM.  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  She states that no one else who ate the food got sick.  She is had multiple episodes of nonbloody emesis without chest pain or shortness of breath.  Her abdominal pain is bandlike and feeling across her mid to lower abdomen.  No radiation or modifying factors.  Patient feels some mild relief with vomiting but pain persists.  She does have ongoing constipation related to her Wellbutrin use and states that she took some milk of magnesia eating that constipation could be her issue.  She had initially formed bowel movements but since that time is had more watery stool.  She had 2 episodes of passing bright red blood which was small in volume and nonpainful.  She does have history of hemorrhoids and anal fissure.  She states that this does not feel similar to either of those issues. No fever or chills.    Past Medical History:  Diagnosis Date   Arthritis    Cancer ([place])    breast   Depression    GERD (gastroesophageal reflux disease)    Hyperlipidemia    Tendonitis     Patient Active Problem List   Diagnosis Date Noted   Sinusitis 10/17/2018   Routine general medical examination at a health care facility 04/05/2018   Neck pain on right side 10/19/2013   Hyperlipidemia 10/20/2012   HSV-1 infection 10/20/2012   GERD (gastroesophageal reflux disease) 10/20/2012   Depression with anxiety 02/01/2009   BREAST CANCER, HX OF  08/26/2007    Past Surgical History:  Procedure Laterality Date   BREAST SURGERY     right   ELBOW SURGERY     right    Allergies Patient has no known allergies.  Family History  Problem Relation Age of Onset   Cancer Mother        bone   Hypertension Father    Diabetes Father    Stroke Father    Hypertension Sister    Alzheimer's disease Brother    Hypertension Sister     Social History Social History   Tobacco Use   Smoking status: Never Smoker   Smokeless tobacco: Never Used  Substance Use Topics   Alcohol use: Yes    Comment: 1-2 glasses of wine a month   Drug use: No    Review of Systems  Constitutional: No fever/chills Eyes: No visual changes. ENT: No sore throat. Cardiovascular: Denies chest pain. Respiratory: Denies shortness of breath. Gastrointestinal: Positive abdominal pain. Positive nausea, vomiting, and diarrhea with BRBPR.  No constipation. Genitourinary: Negative for dysuria. Musculoskeletal: Negative for back pain. Skin: Negative for rash. Neurological: Negative for headaches, focal weakness or numbness.  10-point ROS otherwise negative.  ____________________________________________   PHYSICAL EXAM:  VITAL SIGNS: ED Triage Vitals  Enc Vitals Group     BP 03/26/19 1816 (!) 135/92     Pulse Rate 03/26/19 1816 85     Resp 03/26/19 1816 18     Temp 03/26/19 1816  98.5 F (36.9 C)     Temp Source 03/26/19 1816 Oral     SpO2 03/26/19 1816 97 %     Weight 03/26/19 1815 170 lb (77.1 kg)     Height 03/26/19 1815 5' 7.5" (1.715 m)     Pain Score 03/26/19 1812 3   Constitutional: Alert and oriented. Well appearing and in no acute distress. Eyes: Conjunctivae are normal. Head: Atraumatic. Nose: No congestion/rhinnorhea. Mouth/Throat: Mucous membranes are moist. Neck: No stridor.  Cardiovascular: Normal rate, regular rhythm. Good peripheral circulation. Grossly normal heart sounds.   Respiratory: Normal respiratory effort.   No retractions. Lungs CTAB. Gastrointestinal: Soft with mild left abdominal tenderness. No rebound or guarding. No distention.  Musculoskeletal: No lower extremity tenderness nor edema. No gross deformities of extremities. Neurologic:  Normal speech and language. No gross focal neurologic deficits are appreciated.  Skin:  Skin is warm, dry and intact. No rash noted.  ____________________________________________   LABS (all labs ordered are listed, but only abnormal results are displayed)  Labs Reviewed  COMPREHENSIVE METABOLIC PANEL - Abnormal; Notable for the following components:      Result Value   Sodium 134 (*)    Glucose, Bld 111 (*)    All other components within normal limits  URINALYSIS, ROUTINE W REFLEX MICROSCOPIC - Abnormal; Notable for the following components:   Color, Urine STRAW (*)    Specific Gravity, Urine <1.005 (*)    All other components within normal limits  LIPASE, BLOOD  CBC WITH DIFFERENTIAL/PLATELET   ____________________________________________  RADIOLOGY  Ct Abdomen Pelvis W Contrast  Result Date: 03/26/2019 CLINICAL DATA:  68 y/o F; 8 hours of abdominal pain, nausea, vomiting, 1 episode of diarrhea. EXAM: CT ABDOMEN AND PELVIS WITH CONTRAST TECHNIQUE: Multidetector CT imaging of the abdomen and pelvis was performed using the standard protocol following bolus administration of intravenous contrast. CONTRAST:  142mL OMNIPAQUE IOHEXOL 300 MG/ML  SOLN COMPARISON:  None. FINDINGS: Lower chest: No acute abnormality. Hepatobiliary: No focal liver abnormality is seen. No gallstones, gallbladder wall thickening, or biliary dilatation. Pancreas: Unremarkable. No pancreatic ductal dilatation or surrounding inflammatory changes. Spleen: Normal in size without focal abnormality. Adrenals/Urinary Tract: Adrenal glands are unremarkable. Kidneys are normal, without renal calculi, focal lesion, or obstructive hydronephrosis. There is mild dilatation of the ureters most  pronounced in the left lower ureter upstream to the ureterovesicular junctions which may represent sequelae of chronic outflow obstruction or reflux. Bladder is unremarkable. Stomach/Bowel: Dennis Killilea segment of hypodense wall thickening of the descending colon compatible with acute colitis. No findings of perforation or abscess. Small large bowel is otherwise unremarkable. Normal appendix. Unremarkable stomach. Vascular/Lymphatic: Aortic atherosclerosis. No enlarged abdominal or pelvic lymph nodes. Reproductive: Uterus and bilateral adnexa are unremarkable. Other: Negative. Musculoskeletal: No fracture is seen. IMPRESSION: 1. Jamiel Goncalves segment of acute colitis of descending colon. No evidence for perforation or abscess. 2. Aortic Atherosclerosis (ICD10-I70.0). 3. Mild distention of the ureters possibly representing sequelae of chronic outflow obstruction or reflux. No obstructing stone or mass is evident. Electronically Signed   By: Kristine Garbe M.D.   On: 03/26/2019 19:54    ____________________________________________   PROCEDURES  Procedure(s) performed:   Procedures  None  ____________________________________________   INITIAL IMPRESSION / ASSESSMENT AND PLAN / ED COURSE  Pertinent labs & imaging results that were available during my care of the patient were reviewed by me and considered in my medical decision making (see chart for details).   Patient presents to the emergency department with abdominal pain  with vomiting and diarrhea.  2 episodes of passing small volume bright red blood per rectum.  Abdomen is mildly tender to palpation on the on the left.  Afebrile.  Normal vitals.  Differential at this time includes colitis, diverticulitis with much lower suspicion for rectal fissure, hemorrhoid bleeding, gastroenteritis. Very low suspicion for vascular etiology of pain.   08:20 PM  CT imaging shows descending colitis.  No perforation or abscess.  No other acute findings on CT.  Lab  work reviewed with no significant, acute abnormality.  Plan for azithromycin for 3 days along with Zofran and Bentyl.  Patient to follow with her PCP.  Discussed ED return precautions in detail. ____________________________________________  FINAL CLINICAL IMPRESSION(S) / ED DIAGNOSES  Final diagnoses:  Colitis  Left lower quadrant abdominal pain  Nausea vomiting and diarrhea     MEDICATIONS GIVEN DURING THIS VISIT:  Medications  sodium chloride 0.9 % bolus 500 mL (0 mLs Intravenous Stopped 03/26/19 1918)  ondansetron (ZOFRAN) injection 4 mg (4 mg Intravenous Given 03/26/19 1841)  iohexol (OMNIPAQUE) 300 MG/ML solution 100 mL (100 mLs Intravenous Contrast Given 03/26/19 1932)     NEW OUTPATIENT MEDICATIONS STARTED DURING THIS VISIT:  New Prescriptions   AZITHROMYCIN (ZITHROMAX) 500 MG TABLET    Take 1 tablet (500 mg total) by mouth daily for 3 days. Take first 2 tablets together, then 1 every day until finished.   DICYCLOMINE (BENTYL) 20 MG TABLET    Take 1 tablet (20 mg total) by mouth 3 (three) times daily as needed for spasms (abdominal cramping).   ONDANSETRON (ZOFRAN ODT) 4 MG DISINTEGRATING TABLET    Take 1 tablet (4 mg total) by mouth every 8 (eight) hours as needed.    Note:  This document was prepared using Dragon voice recognition software and may include unintentional dictation errors.  Nanda Quinton, MD Emergency Medicine    Brenley Priore, [facility], MD 03/26/19 2023

## 2019-03-26 NOTE — ED Triage Notes (Addendum)
Pt c/o abdominal pain and vomiting onset about 11:30 am today that occurred after eating waffles. Family members ate same food without any symptoms. Pt also reports 2 episodes of blood in stool.

## 2019-03-26 NOTE — ED Notes (Signed)
Patient transported to CT 

## 2019-03-28 ENCOUNTER — Telehealth: Payer: Self-pay | Admitting: Family Medicine

## 2019-03-28 NOTE — Telephone Encounter (Signed)
Patient has an appt tomorrow and she called because she has some concerns that she would like the nurse to contact her about earlier tomorrow before the appt.

## 2019-03-29 ENCOUNTER — Ambulatory Visit (INDEPENDENT_AMBULATORY_CARE_PROVIDER_SITE_OTHER): Payer: Medicare Other | Admitting: Family Medicine

## 2019-03-29 ENCOUNTER — Encounter: Payer: Self-pay | Admitting: Family Medicine

## 2019-03-29 DIAGNOSIS — K529 Noninfective gastroenteritis and colitis, unspecified: Secondary | ICD-10-CM | POA: Insufficient documentation

## 2019-03-29 NOTE — Telephone Encounter (Signed)
Called Pt to ask her about the questions she may have before her virtual visit today. LAM to RCB.

## 2019-03-29 NOTE — Telephone Encounter (Signed)
Pt called back. Task & Visit completed.

## 2019-03-29 NOTE — Assessment & Plan Note (Signed)
-  Completed course of azithromycin and has had improvement, discussed that I anticipated that this will continue to improve.  -She should keep herself well hydrated and advance diet as tolerated.  -She will call me back for any worsening or changing symptoms.

## 2019-03-29 NOTE — Progress Notes (Signed)
Cindy [name] - 68 y.o. female MRN [id number]  Date of birth: 11/02/1951   This visit type was conducted due to national recommendations for restrictions regarding the COVID-19 Pandemic (e.g. social distancing).  This format is felt to be most appropriate for this patient at this time.  All issues noted in this document were discussed and addressed.  No physical exam was performed (except for noted visual exam findings with Video Visits).  I discussed the limitations of evaluation and management by telemedicine and the availability of in person appointments. The patient expressed understanding and agreed to proceed.  I connected with@ on 03/29/19 at  2:30 PM EDT by a video enabled telemedicine application and verified that I am speaking with the correct person using two identifiers.   Patient Location: Home [address] [place] [address]   Provider location:   [facility]  Chief Complaint  Patient presents with  . Follow-up    ED F/U visit, Colitis Dx, ABD pain lessen, Rx meds Q8Hrs, bland diet    HPI  Cindy [name] is a 67 y.o. female who presents via audio/video conferencing for a telehealth visit today.  She is following up today for recent ED visit.  Seen on 03/26/2019 for blood in her stool and abdominal pain.  CT scan showing colitis of descending colon.  Treated with 3 days of azithromycin 500mg  with zofran and bentyl for symptoms control.  She reports that she is feeling better today.  Still with some mild pain but no further blood in her stool.  She is slowly advancing diet as tolerated.  She denies fever, chills, nausea, diarrhea or dizziness.  She is up to date on colonoscopy, next test due in 2022.     ROS:  A comprehensive ROS was completed and negative except as noted per HPI  Past Medical History:  Diagnosis Date  . Arthritis   . Cancer ([place])    breast  . Depression   . GERD (gastroesophageal reflux disease)   . Hyperlipidemia   . Tendonitis     Past  Surgical History:  Procedure Laterality Date  . BREAST SURGERY     right  . ELBOW SURGERY     right    Family History  Problem Relation Age of Onset  . Cancer Mother        bone  . Hypertension Father   . Diabetes Father   . Stroke Father   . Hypertension Sister   . Alzheimer's disease Brother   . Hypertension Sister     Social History   Socioeconomic History  . Marital status: Married    Spouse name: Not on file  . Number of children: Not on file  . Years of education: Not on file  . Highest education level: Not on file  Occupational History  . Not on file  Social Needs  . Financial resource strain: Not on file  . Food insecurity:    Worry: Not on file    Inability: Not on file  . Transportation needs:    Medical: Not on file    Non-medical: Not on file  Tobacco Use  . Smoking status: Never Smoker  . Smokeless tobacco: Never Used  Substance and Sexual Activity  . Alcohol use: Yes    Comment: 1-2 glasses of wine a month  . Drug use: No  . Sexual activity: Not on file  Lifestyle  . Physical activity:    Days per week: Not on file  Minutes per session: Not on file  . Stress: Not on file  Relationships  . Social connections:    Talks on phone: Not on file    Gets together: Not on file    Attends religious service: Not on file    Active member of club or organization: Not on file    Attends meetings of clubs or organizations: Not on file    Relationship status: Not on file  . Intimate partner violence:    Fear of current or ex partner: Not on file    Emotionally abused: Not on file    Physically abused: Not on file    Forced sexual activity: Not on file  Other Topics Concern  . Not on file  Social History Narrative  . Not on file     Current Outpatient Medications:  .  azithromycin (ZITHROMAX) 500 MG tablet, Take 1 tablet (500 mg total) by mouth daily for 3 days. Take first 2 tablets together, then 1 every day until finished., Disp: 3 tablet, Rfl:  0 .  buPROPion (WELLBUTRIN XL) 150 MG 24 hr tablet, TAKE 3 TABLETS BY MOUTH DAILY, Disp: 270 tablet, Rfl: 4 .  Calcium Carbonate-Vit D-Min (CALCIUM 1200 PO), Take 1 tablet by mouth daily. , Disp: , Rfl:  .  clonazePAM (KLONOPIN) 1 MG tablet, TAKE ONE TABLET BY MOUTH TWICE A DAY, Disp: 60 tablet, Rfl: 3 .  dicyclomine (BENTYL) 20 MG tablet, Take 1 tablet (20 mg total) by mouth 3 (three) times daily as needed for spasms (abdominal cramping)., Disp: 20 tablet, Rfl: 0 .  doxycycline (VIBRA-TABS) 100 MG tablet, Take 1 tablet (100 mg total) by mouth 2 (two) times daily., Disp: 20 tablet, Rfl: 0 .  HYDROcodone-homatropine (HYCODAN) 5-1.5 MG/5ML syrup, Take 5 mLs by mouth every 8 (eight) hours as needed., Disp: 120 mL, Rfl: 0 .  hydrocortisone (ANUSOL-HC) 25 MG suppository, UNWRAP AND INSERT 1 SUPPOSITORY RECTALLYTWICE A DAY, Disp: 12 suppository, Rfl: 0 .  hydrocortisone (PROCTOSOL HC) 2.5 % rectal cream, Apply small amounts each bedtime, Disp: 30 g, Rfl: 3 .  ibuprofen (ADVIL,MOTRIN) 800 MG tablet, Take 1 tablet (800 mg total) by mouth 3 (three) times daily., Disp: 21 tablet, Rfl: 0 .  lovastatin (MEVACOR) 20 MG tablet, Take 1 tablet (20 mg total) by mouth at bedtime., Disp: 100 tablet, Rfl: 4 .  lovastatin (MEVACOR) 20 MG tablet, TAKE 1 TABLET BY MOUTH AT BEDTIME, Disp: 90 tablet, Rfl: 4 .  Multiple Vitamin (MULTIVITAMIN) tablet, Take 1 tablet by mouth daily.  , Disp: , Rfl:  .  omeprazole (PRILOSEC) 40 MG capsule, Take 1 capsule (40 mg total) by mouth daily., Disp: 90 capsule, Rfl: 4 .  ondansetron (ZOFRAN ODT) 4 MG disintegrating tablet, Take 1 tablet (4 mg total) by mouth every 8 (eight) hours as needed., Disp: 20 tablet, Rfl: 0 .  PARoxetine (PAXIL) 10 MG tablet, Take 1 tablet (10 mg total) by mouth every morning., Disp: 90 tablet, Rfl: 4 .  PARoxetine (PAXIL) 20 MG tablet, Take 1 tablet (20 mg total) by mouth daily., Disp: 100 tablet, Rfl: 4 .  sulfamethoxazole-trimethoprim (BACTRIM DS,SEPTRA DS)  800-160 MG tablet, Take 1 tablet by mouth 2 (two) times daily., Disp: 20 tablet, Rfl: 1 .  triamcinolone (KENALOG) 0.025 % ointment, Apply topically 2 (two) times daily. (Patient taking differently: Apply 1 application topically 2 (two) times daily as needed (for ezcema.). ), Disp: 80 g, Rfl: 2 .  valACYclovir (VALTREX) 500 MG tablet, Take 1 tablet (500  mg total) by mouth daily., Disp: 90 tablet, Rfl: 1  EXAM:  VITALS per patient if applicable: BP 173/56 Comment: pt took as she was on ph @ hm  Pulse 63 Comment: reports from [facility]  Temp 97.8 F (36.6 C) (Oral) Comment: pt reports from [facility]m this am  Ht 5' 7.5" (1.715 m)   Wt 169 lb (76.7 kg) Comment: pt report from [facility].  BMI 26.08 kg/m   GENERAL: alert, oriented, appears well and in no acute distress  HEENT: atraumatic, conjunttiva clear, no obvious abnormalities on inspection of external nose and ears  NECK: normal movements of the head and neck  LUNGS: on inspection no signs of respiratory distress, breathing rate appears normal, no obvious gross SOB, gasping or wheezing  CV: no obvious cyanosis  MS: moves all visible extremities without noticeable abnormality  PSYCH/NEURO: pleasant and cooperative, no obvious depression or anxiety, speech and thought processing grossly intact  ASSESSMENT AND PLAN:  Discussed the following assessment and plan:  Colitis, acute -Completed course of azithromycin and has had improvement, discussed that I anticipated that this will continue to improve.  -She should keep herself well hydrated and advance diet as tolerated.  -She will call me back for any worsening or changing symptoms.        I discussed the assessment and treatment plan with the patient. The patient was provided an opportunity to ask questions and all were answered. The patient agreed with the plan and demonstrated an understanding of the instructions.   The patient was advised to call back or seek an in-person evaluation if the  symptoms worsen or if the condition fails to improve as anticipated.    Luetta Nutting, DO

## 2019-04-10 ENCOUNTER — Encounter: Payer: Medicare Other | Admitting: Family Medicine

## 2019-05-04 ENCOUNTER — Other Ambulatory Visit: Payer: Self-pay

## 2019-05-04 ENCOUNTER — Telehealth: Payer: Self-pay | Admitting: Family Medicine

## 2019-05-04 DIAGNOSIS — K529 Noninfective gastroenteritis and colitis, unspecified: Secondary | ICD-10-CM

## 2019-05-04 MED ORDER — DICYCLOMINE HCL 20 MG PO TABS: 20.0000 mg | ORAL_TABLET | Freq: Three times a day (TID) | ORAL | 0 refills | Status: DC | PRN

## 2019-05-04 NOTE — Telephone Encounter (Unsigned)
Copied from [place] [id number]. Topic: Quick Communication - Rx Refill/Question >> May 04, 2019 10:12 AM Celene Kras A wrote: Medication: dicyclomine (BENTYL) 20 MG tablet   Has the patient contacted their pharmacy? No. Pt is requesting a refill to hold her over until her appt on 05/22/2019. Please advise (Agent: If no, request that the patient contact the pharmacy for the refill.) (Agent: If yes, when and what did the pharmacy advise?)  Preferred Pharmacy (with phone number or street name): [facility], [place] - [address] [place] [address] Phone: [phone] Fax: [phone] Not a 24 hour pharmacy; exact hours not known.    Agent: Please be advised that RX refills may take up to 3 business days. We ask that you follow-up with your pharmacy.

## 2019-05-04 NOTE — Telephone Encounter (Signed)
Refill sent.

## 2019-05-04 NOTE — Telephone Encounter (Signed)
Ok to refill 

## 2019-05-05 ENCOUNTER — Other Ambulatory Visit: Payer: Self-pay | Admitting: Family Medicine

## 2019-05-05 DIAGNOSIS — K529 Noninfective gastroenteritis and colitis, unspecified: Secondary | ICD-10-CM

## 2019-05-05 MED ORDER — DICYCLOMINE HCL 20 MG PO TABS: 20.0000 mg | ORAL_TABLET | Freq: Three times a day (TID) | ORAL | 0 refills | Status: DC | PRN

## 2019-05-05 NOTE — Addendum Note (Signed)
Addended by: Rodrigo Ran on: 05/05/2019 02:20 PM   Modules accepted: Orders

## 2019-05-22 ENCOUNTER — Ambulatory Visit (INDEPENDENT_AMBULATORY_CARE_PROVIDER_SITE_OTHER): Payer: Medicare Other | Admitting: Family Medicine

## 2019-05-22 ENCOUNTER — Encounter: Payer: Self-pay | Admitting: Family Medicine

## 2019-05-22 VITALS — BP 126/84 | HR 80 | Temp 98.0°F | Resp 18 | Ht 67.0 in | Wt 169.2 lb

## 2019-05-22 DIAGNOSIS — F418 Other specified anxiety disorders: Secondary | ICD-10-CM | POA: Diagnosis not present

## 2019-05-22 DIAGNOSIS — Z23 Encounter for immunization: Secondary | ICD-10-CM | POA: Diagnosis not present

## 2019-05-22 DIAGNOSIS — Z0000 Encounter for general adult medical examination without abnormal findings: Secondary | ICD-10-CM

## 2019-05-22 DIAGNOSIS — E7800 Pure hypercholesterolemia, unspecified: Secondary | ICD-10-CM

## 2019-05-22 LAB — COMPREHENSIVE METABOLIC PANEL
ALT: 18 U/L (ref 0–35)
AST: 21 U/L (ref 0–37)
Albumin: 4.5 g/dL (ref 3.5–5.2)
Alkaline Phosphatase: 86 U/L (ref 39–117)
BUN: 10 mg/dL (ref 6–23)
CO2: 27 mEq/L (ref 19–32)
Calcium: 9.4 mg/dL (ref 8.4–10.5)
Chloride: 104 mEq/L (ref 96–112)
Creatinine, Ser: 0.85 mg/dL (ref 0.40–1.20)
GFR: 66.84 mL/min (ref >60.00)
Glucose, Bld: 84 mg/dL (ref 70–99)
Potassium: 3.9 mEq/L (ref 3.5–5.1)
Sodium: 140 mEq/L (ref 135–145)
Total Bilirubin: 0.4 mg/dL (ref 0.2–1.2)
Total Protein: 7.1 g/dL (ref 6.0–8.3)

## 2019-05-22 LAB — TSH: TSH: 2.41 u[IU]/mL (ref 0.35–4.50)

## 2019-05-22 LAB — CBC WITH DIFFERENTIAL/PLATELET
Basophils Absolute: 0 10*3/uL (ref 0.0–0.1)
Basophils Relative: 0.5 % (ref 0.0–3.0)
Eosinophils Absolute: 0.1 10*3/uL (ref 0.0–0.7)
Eosinophils Relative: 1.5 % (ref 0.0–5.0)
HCT: 39.6 % (ref 36.0–46.0)
Hemoglobin: 13.2 g/dL (ref 12.0–15.0)
Lymphocytes Relative: 44.9 % (ref 12.0–46.0)
Lymphs Abs: 2.6 10*3/uL (ref 0.7–4.0)
MCHC: 33.4 g/dL (ref 30.0–36.0)
MCV: 86.6 fl (ref 78.0–100.0)
Monocytes Absolute: 0.5 10*3/uL (ref 0.1–1.0)
Monocytes Relative: 8.3 % (ref 3.0–12.0)
Neutro Abs: 2.6 10*3/uL (ref 1.4–7.7)
Neutrophils Relative %: 44.8 % (ref 43.0–77.0)
Platelets: 204 10*3/uL (ref 150.0–400.0)
RBC: 4.57 Mil/uL (ref 3.87–5.11)
RDW: 15 % (ref 11.5–15.5)
WBC: 5.7 10*3/uL (ref 4.0–10.5)

## 2019-05-22 NOTE — Patient Instructions (Signed)
  Cindy [name] , Thank you for taking time to come for your Medicare Wellness Visit. I appreciate your ongoing commitment to your health goals. Please review the following plan we discussed and let me know if I can assist you in the future.   These are the goals we discussed: Goals   None     This is a list of the screening recommended for you and due dates:  Health Maintenance  Topic Date Due  . Pneumonia vaccines (2 of 2 - PPSV23) 04/06/2019  . Mammogram  06/10/2019  . Flu Shot  07/15/2019  . Colon Cancer Screening  11/16/2021  . Tetanus Vaccine  04/05/2028  . DEXA scan (bone density measurement)  Completed  .  Hepatitis C: One time screening is recommended by Center for Disease Control  (CDC) for  adults born from [birth year] through 1963.   Completed

## 2019-05-25 NOTE — Progress Notes (Signed)
Can we check on lipid panel?  These keep getting missed.  Thanks!

## 2019-05-26 ENCOUNTER — Other Ambulatory Visit: Payer: Self-pay

## 2019-05-26 ENCOUNTER — Other Ambulatory Visit: Payer: Medicare Other

## 2019-05-26 ENCOUNTER — Encounter: Payer: Self-pay | Admitting: Family Medicine

## 2019-05-26 ENCOUNTER — Other Ambulatory Visit (INDEPENDENT_AMBULATORY_CARE_PROVIDER_SITE_OTHER): Payer: Medicare Other

## 2019-05-26 DIAGNOSIS — R739 Hyperglycemia, unspecified: Secondary | ICD-10-CM | POA: Diagnosis not present

## 2019-05-26 DIAGNOSIS — R7309 Other abnormal glucose: Secondary | ICD-10-CM

## 2019-05-26 DIAGNOSIS — E785 Hyperlipidemia, unspecified: Secondary | ICD-10-CM

## 2019-05-26 DIAGNOSIS — E7800 Pure hypercholesterolemia, unspecified: Secondary | ICD-10-CM

## 2019-05-26 LAB — HEMOGLOBIN A1C: Hgb A1c MFr Bld: 5.8 % (ref 4.6–6.5)

## 2019-05-26 NOTE — Assessment & Plan Note (Signed)
-  Stable on current dose of paxil and bupropion, continue.

## 2019-05-26 NOTE — Progress Notes (Signed)
KYREE FEDORKO - 68 y.o. female MRN [id number]  Date of birth: 01-12-1951   Subjective Chief Complaint  Patient presents with  . Annual Exam    AWV - Welcome to Medicare     HPI  Here today for AWV.   HRA completed, reviewed and scanned to media.   Cardiac Risk Factors include: advanced age (>34men, >31 women),  HLD Sleep patterns: No issues.  Home Safety/Smoke Alarms: Feels safe in home. Smoke alarms in place.   Lives with husband  Review of Systems  Constitutional: Negative for chills, fever, malaise/fatigue and weight loss.  HENT: Negative for congestion, ear pain and sore throat.   Eyes: Negative for blurred vision, double vision and pain.  Respiratory: Negative for cough and shortness of breath.   Cardiovascular: Negative for chest pain and palpitations.  Gastrointestinal: Negative for abdominal pain, blood in stool, constipation, heartburn and nausea.  Genitourinary: Negative for dysuria and urgency.  Musculoskeletal: Negative for joint pain and myalgias.  Neurological: Negative for dizziness and headaches.  Endo/Heme/Allergies: Does not bruise/bleed easily.  Psychiatric/Behavioral: Negative for depression. The patient is not nervous/anxious and does not have insomnia.      No Known Allergies  Past Medical History:  Diagnosis Date  . Arthritis   . Cancer ([place])    breast  . Depression   . GERD (gastroesophageal reflux disease)   . Hyperlipidemia   . Tendonitis     Past Surgical History:  Procedure Laterality Date  . BREAST SURGERY     right  . ELBOW SURGERY     right    Social History   Socioeconomic History  . Marital status: Married    Spouse name: [name]  . Number of children: Not on file  . Years of education: Not on file  . Highest education level: Not on file  Occupational History  . Not on file  Social Needs  . Financial resource strain: Not very hard  . Food insecurity    Worry: Never true    Inability: Never true  . Transportation  needs    Medical: No    Non-medical: No  Tobacco Use  . Smoking status: Never Smoker  . Smokeless tobacco: Never Used  Substance and Sexual Activity  . Alcohol use: Yes    Comment: 1-2 glasses of wine a month  . Drug use: No  . Sexual activity: Yes    Birth control/protection: Post-menopausal  Lifestyle  . Physical activity    Days per week: 0 days    Minutes per session: 0 min  . Stress: Only a little  Relationships  . Social Herbalist on phone: Twice a week    Gets together: Twice a week    Attends religious service: Not on file    Active member of club or organization: Not on file    Attends meetings of clubs or organizations: Not on file    Relationship status: Not on file  Other Topics Concern  . Not on file  Social History Narrative  . Not on file    Family History  Problem Relation Age of Onset  . Cancer Mother        bone  . Hypertension Father   . Diabetes Father   . Stroke Father   . Hypertension Sister   . Alzheimer's disease Brother   . Hypertension Sister     Health Maintenance  Topic Date Due  . MAMMOGRAM  06/10/2019  . INFLUENZA VACCINE  07/15/2019  .  COLONOSCOPY  11/16/2021  . TETANUS/TDAP  04/05/2028  . DEXA SCAN  Completed  . Hepatitis C Screening  Completed  . PNA vac Low Risk Adult  Completed    Depression screen [facility] 2/9 05/22/2019 05/22/2019 03/30/2017  Decreased Interest 1 0 0  Down, Depressed, Hopeless 1 0 0  PHQ - 2 Score 2 0 0  Altered sleeping 1 - -  Tired, decreased energy 1 - -  Change in appetite 1 - -  Feeling bad or failure about yourself  0 - -  Trouble concentrating 0 - -  Moving slowly or fidgety/restless 1 - -  Suicidal thoughts 0 - -  PHQ-9 Score 6 - -  Difficult doing work/chores Somewhat difficult - -    Current Meds  Medication Sig  . buPROPion (WELLBUTRIN XL) 150 MG 24 hr tablet TAKE 3 TABLETS BY MOUTH DAILY  . Calcium Carbonate-Vit D-Min (CALCIUM 1200 PO) Take 1 tablet by mouth daily.   .  clonazePAM (KLONOPIN) 1 MG tablet TAKE ONE TABLET BY MOUTH TWICE A DAY  . dicyclomine (BENTYL) 20 MG tablet Take 1 tablet (20 mg total) by mouth 3 (three) times daily as needed for spasms (abdominal cramping).  Marland Kitchen doxycycline (VIBRA-TABS) 100 MG tablet Take 1 tablet (100 mg total) by mouth 2 (two) times daily.  Marland Kitchen HYDROcodone-homatropine (HYCODAN) 5-1.5 MG/5ML syrup Take 5 mLs by mouth every 8 (eight) hours as needed.  . hydrocortisone (ANUSOL-HC) 25 MG suppository UNWRAP AND INSERT 1 SUPPOSITORY RECTALLYTWICE A DAY  . hydrocortisone (PROCTOSOL HC) 2.5 % rectal cream Apply small amounts each bedtime  . ibuprofen (ADVIL,MOTRIN) 800 MG tablet Take 1 tablet (800 mg total) by mouth 3 (three) times daily.  Marland Kitchen lovastatin (MEVACOR) 20 MG tablet Take 1 tablet (20 mg total) by mouth at bedtime.  . Multiple Vitamin (MULTIVITAMIN) tablet Take 1 tablet by mouth daily.    Marland Kitchen omeprazole (PRILOSEC) 40 MG capsule Take 1 capsule (40 mg total) by mouth daily.  . ondansetron (ZOFRAN ODT) 4 MG disintegrating tablet Take 1 tablet (4 mg total) by mouth every 8 (eight) hours as needed.  Marland Kitchen PARoxetine (PAXIL) 10 MG tablet Take 1 tablet (10 mg total) by mouth every morning.  Marland Kitchen PARoxetine (PAXIL) 20 MG tablet Take 1 tablet (20 mg total) by mouth daily.  Marland Kitchen sulfamethoxazole-trimethoprim (BACTRIM DS,SEPTRA DS) 800-160 MG tablet Take 1 tablet by mouth 2 (two) times daily.  Marland Kitchen triamcinolone (KENALOG) 0.025 % ointment Apply topically 2 (two) times daily. (Patient taking differently: Apply 1 application topically 2 (two) times daily as needed (for ezcema.). )  . valACYclovir (VALTREX) 500 MG tablet Take 1 tablet (500 mg total) by mouth daily.  . [DISCONTINUED] lovastatin (MEVACOR) 20 MG tablet TAKE 1 TABLET BY MOUTH AT BEDTIME   Fall Risk  05/22/2019 03/30/2017  Falls in the past year? 0 Yes  Number falls in past yr: 0 1  Injury with Fall? 0 No  Follow up Falls evaluation completed -   Care Team: Luetta Nutting, DO   ----------------------------------------------------------------------------------------------------------------------------------------------------------------------------------------------------------------- Physical Exam BP 126/84   Pulse 80   Temp 98 F (36.7 C) (Oral)   Resp 18   Ht 5\' 7"  (1.702 m)   Wt 169 lb 3.2 oz (76.7 kg)   SpO2 98%   BMI 26.50 kg/m    Cognitive Screening: Mini Cog 5/5  ------------------------------------------------------------------------------------------------------------------------------------------------------------------------------------------------------------------ Assessment and Plan This is a routine wellness examination for Homero Fellers.   Counseling provided on upcoming health maintenance items as listed below:  There  are no preventive care reminders to display for this patient.  Continue to eat heart healthy diet (full of fruits, vegetables, whole grains, lean protein, water--limit salt, fat, and sugar intake) and increase physical activity as tolerated.  Continue doing brain stimulating activities (puzzles, reading, adult coloring books, staying active) to keep memory sharp.  I have personally reviewed and noted the following in the patient's chart:   Medical and social history   Use of alcohol, tobacco or illicit drugs   Current medications and supplements   Functional ability and status   Nutritional status   Physical activity   Advanced directives   List of other physicians   Hospitalizations, surgeries, and ER visits in previous 12 months   Vitals   Screenings to include cognitive, depression, and falls   Referrals and appointments In addition, I have reviewed and discussed with patient certain preventive protocols, quality metrics, and best practice recommendations. A written personalized care plan for preventive services as well as general preventive health recommendations were provided to patient.   Advanced Directives Reviewed and/or information given  Additional chronic medical problems also addressed this visit, please see separate note.

## 2019-05-26 NOTE — Progress Notes (Signed)
Cindy [name] - 68 y.o. female MRN [id number]  Date of birth: [birth year]  Subjective Chief Complaint  Patient presents with  . Annual Exam    AWV - Welcome to Medicare     HPI Cindy [name] is a 68 y.o. female here today for follow up of chronic medical conditions. She reports she is doing well with current medications for management of depression and cholesterol.  She has been on paxil with bupropion for several years and feels that symptoms are stable.  She recently retired and has been enjoying this.  She has been doing things to keep her mind and body active around the house including exercise.  Depression screen [facility] 2/9 05/22/2019 05/22/2019 03/30/2017  Decreased Interest 1 0 0  Down, Depressed, Hopeless 1 0 0  PHQ - 2 Score 2 0 0  Altered sleeping 1 - -  Tired, decreased energy 1 - -  Change in appetite 1 - -  Feeling bad or failure about yourself  0 - -  Trouble concentrating 0 - -  Moving slowly or fidgety/restless 1 - -  Suicidal thoughts 0 - -  PHQ-9 Score 6 - -  Difficult doing work/chores Somewhat difficult - -       In regards to her cholesterol she is tolerating lovastatin well without side effects including myalgias.    ROS:  A comprehensive ROS was completed and negative except as noted per HPI  No Known Allergies  Past Medical History:  Diagnosis Date  . Arthritis   . Cancer ([place])    breast  . Depression   . GERD (gastroesophageal reflux disease)   . Hyperlipidemia   . Tendonitis     Past Surgical History:  Procedure Laterality Date  . BREAST SURGERY     right  . ELBOW SURGERY     right    Social History   Socioeconomic History  . Marital status: Married    Spouse name: Cindy [name]  . Number of children: Not on file  . Years of education: Not on file  . Highest education level: Not on file  Occupational History  . Not on file  Social Needs  . Financial resource strain: Not very hard  . Food insecurity    Worry: Never true    Inability: Never  true  . Transportation needs    Medical: No    Non-medical: No  Tobacco Use  . Smoking status: Never Smoker  . Smokeless tobacco: Never Used  Substance and Sexual Activity  . Alcohol use: Yes    Comment: 1-2 glasses of wine a month  . Drug use: No  . Sexual activity: Yes    Birth control/protection: Post-menopausal  Lifestyle  . Physical activity    Days per week: 0 days    Minutes per session: 0 min  . Stress: Only a little  Relationships  . Social Herbalist on phone: Twice a week    Gets together: Twice a week    Attends religious service: Not on file    Active member of club or organization: Not on file    Attends meetings of clubs or organizations: Not on file    Relationship status: Not on file  Other Topics Concern  . Not on file  Social History Narrative  . Not on file    Family History  Problem Relation Age of Onset  . Cancer Mother        bone  . Hypertension Father   .  Diabetes Father   . Stroke Father   . Hypertension Sister   . Alzheimer's disease Brother   . Hypertension Sister     Health Maintenance  Topic Date Due  . MAMMOGRAM  06/10/2019  . INFLUENZA VACCINE  07/15/2019  . COLONOSCOPY  11/16/2021  . TETANUS/TDAP  04/05/2028  . DEXA SCAN  Completed  . Hepatitis C Screening  Completed  . PNA vac Low Risk Adult  Completed    ----------------------------------------------------------------------------------------------------------------------------------------------------------------------------------------------------------------- Physical Exam BP 126/84   Pulse 80   Temp 98 F (36.7 C) (Oral)   Resp 18   Ht 5\' 7"  (1.702 m)   Wt 169 lb 3.2 oz (76.7 kg)   SpO2 98%   BMI 26.50 kg/m   Physical Exam Constitutional:      General: She is not in acute distress. HENT:     Head: Normocephalic and atraumatic.     Nose: Nose normal.  Eyes:     General: No scleral icterus.    Conjunctiva/sclera: Conjunctivae normal.  Neck:      Musculoskeletal: Normal range of motion and neck supple.     Thyroid: No thyromegaly.  Cardiovascular:     Rate and Rhythm: Normal rate and regular rhythm.     Heart sounds: Normal heart sounds.  Pulmonary:     Effort: Pulmonary effort is normal.     Breath sounds: Normal breath sounds.  Abdominal:     General: Bowel sounds are normal. There is no distension.     Palpations: Abdomen is soft.     Tenderness: There is no abdominal tenderness. There is no guarding.  Musculoskeletal: Normal range of motion.  Lymphadenopathy:     Cervical: No cervical adenopathy.  Skin:    General: Skin is warm and dry.     Findings: No rash.  Neurological:     Mental Status: She is alert and oriented to person, place, and time.     Cranial Nerves: No cranial nerve deficit.     Coordination: Coordination normal.  Psychiatric:        Behavior: Behavior normal.     ------------------------------------------------------------------------------------------------------------------------------------------------------------------------------------------------------------------- Assessment and Plan  Hyperlipidemia -Update labs today -I'll have her continue lovastatin, tolerating well.   Depression with anxiety -Stable on current dose of paxil and bupropion, continue.

## 2019-05-26 NOTE — Assessment & Plan Note (Signed)
-  Update labs today -I'll have her continue lovastatin, tolerating well.

## 2019-05-30 ENCOUNTER — Other Ambulatory Visit (INDEPENDENT_AMBULATORY_CARE_PROVIDER_SITE_OTHER): Payer: Medicare Other

## 2019-05-30 DIAGNOSIS — E7800 Pure hypercholesterolemia, unspecified: Secondary | ICD-10-CM | POA: Diagnosis not present

## 2019-05-30 DIAGNOSIS — F418 Other specified anxiety disorders: Secondary | ICD-10-CM

## 2019-05-30 LAB — LIPID PANEL
Cholesterol: 175 mg/dL (ref 0–200)
HDL: 81 mg/dL (ref >39.00)
LDL Cholesterol: 76 mg/dL (ref 0–99)
NonHDL: 93.51
Total CHOL/HDL Ratio: 2
Triglycerides: 86 mg/dL (ref 0.0–149.0)
VLDL: 17.2 mg/dL (ref 0.0–40.0)

## 2019-05-30 NOTE — Progress Notes (Signed)
Lipids look great, improved further from last year.

## 2019-06-01 ENCOUNTER — Other Ambulatory Visit: Payer: Self-pay | Admitting: Family Medicine

## 2019-07-03 DIAGNOSIS — Z1231 Encounter for screening mammogram for malignant neoplasm of breast: Secondary | ICD-10-CM | POA: Diagnosis not present

## 2019-07-03 DIAGNOSIS — Z853 Personal history of malignant neoplasm of breast: Secondary | ICD-10-CM | POA: Diagnosis not present

## 2019-07-13 ENCOUNTER — Encounter: Payer: Self-pay | Admitting: Family Medicine

## 2019-07-17 ENCOUNTER — Encounter: Payer: Self-pay | Admitting: Family Medicine

## 2019-07-17 ENCOUNTER — Other Ambulatory Visit: Payer: Self-pay

## 2019-07-17 ENCOUNTER — Other Ambulatory Visit: Payer: Self-pay | Admitting: Family Medicine

## 2019-07-17 MED ORDER — FLUCONAZOLE 150 MG PO TABS: 150.0000 mg | ORAL_TABLET | Freq: Once | ORAL | 0 refills | Status: AC

## 2019-07-17 MED ORDER — FLUCONAZOLE 150 MG PO TABS: 150.0000 mg | ORAL_TABLET | Freq: Once | ORAL | 0 refills | Status: DC

## 2019-07-17 NOTE — Telephone Encounter (Signed)
Fluconazole sent in, please have her f/u if not improving. Thanks!

## 2019-07-18 ENCOUNTER — Other Ambulatory Visit: Payer: Self-pay

## 2019-07-18 MED ORDER — LOVASTATIN 20 MG PO TABS: 20.0000 mg | ORAL_TABLET | Freq: Every day | ORAL | 4 refills | Status: DC

## 2019-08-15 ENCOUNTER — Other Ambulatory Visit: Payer: Self-pay

## 2019-08-15 ENCOUNTER — Encounter: Payer: Self-pay | Admitting: Gastroenterology

## 2019-08-15 ENCOUNTER — Ambulatory Visit (INDEPENDENT_AMBULATORY_CARE_PROVIDER_SITE_OTHER): Payer: Medicare Other | Admitting: Gastroenterology

## 2019-08-15 VITALS — BP 118/76 | HR 74 | Temp 97.9°F | Ht 68.0 in | Wt 173.0 lb

## 2019-08-15 DIAGNOSIS — K921 Melena: Secondary | ICD-10-CM | POA: Diagnosis not present

## 2019-08-15 DIAGNOSIS — R933 Abnormal findings on diagnostic imaging of other parts of digestive tract: Secondary | ICD-10-CM

## 2019-08-15 DIAGNOSIS — K5904 Chronic idiopathic constipation: Secondary | ICD-10-CM

## 2019-08-15 MED ORDER — NA SULFATE-K SULFATE-MG SULF 17.5-3.13-1.6 GM/177ML PO SOLN: 1.0000 | Freq: Once | ORAL | 0 refills | Status: AC

## 2019-08-15 NOTE — Progress Notes (Signed)
History of Present Illness: This is a 68 year old female referred by Luetta Nutting, DO for the evaluation of constipation.  In addition she had an acute episode of nausea vomiting diarrhea, hematochezia and was evaluated in the ED in April.  CTAP showed a long segment of descending colon hypodense wall thickening consistent with acute colitis.  She was prescribed doxycycline and dicyclomine.  Her acute symptoms resolved after several days however since that time she has had difficulty with constipation.  Often 4 to 5 days between bowel movements.  She has been taking Colace daily and milk of magnesia every several days.  No other current gastrointestinal complaints.  Her last colonoscopy was in December 2012 and was normal.   No Known Allergies Outpatient Medications Prior to Visit  Medication Sig Dispense Refill  . buPROPion (WELLBUTRIN XL) 150 MG 24 hr tablet TAKE 3 TABLETS BY MOUTH DAILY 270 tablet 4  . Calcium Carbonate-Vit D-Min (CALCIUM 1200 PO) Take 1 tablet by mouth daily.     . clonazePAM (KLONOPIN) 1 MG tablet TAKE ONE TABLET BY MOUTH TWICE A DAY 60 tablet 3  . dicyclomine (BENTYL) 20 MG tablet Take 1 tablet (20 mg total) by mouth 3 (three) times daily as needed for spasms (abdominal cramping). 20 tablet 0  . docusate sodium (COLACE) 100 MG capsule Take 100 mg by mouth at bedtime.    Marland Kitchen doxycycline (VIBRA-TABS) 100 MG tablet Take 1 tablet (100 mg total) by mouth 2 (two) times daily. 20 tablet 0  . hydrocortisone (ANUSOL-HC) 25 MG suppository UNWRAP AND INSERT 1 SUPPOSITORY RECTALLYTWICE A DAY 12 suppository 0  . hydrocortisone (PROCTOSOL HC) 2.5 % rectal cream Apply small amounts each bedtime 30 g 3  . ibuprofen (ADVIL,MOTRIN) 800 MG tablet Take 1 tablet (800 mg total) by mouth 3 (three) times daily. 21 tablet 0  . lovastatin (MEVACOR) 20 MG tablet Take 1 tablet (20 mg total) by mouth at bedtime. 100 tablet 4  . Multiple Vitamin (MULTIVITAMIN) tablet Take 1 tablet by mouth daily.       Marland Kitchen omeprazole (PRILOSEC) 40 MG capsule TAKE 1 CAPSULE BY MOUTH DAILY 90 capsule 4  . ondansetron (ZOFRAN ODT) 4 MG disintegrating tablet Take 1 tablet (4 mg total) by mouth every 8 (eight) hours as needed. 20 tablet 0  . PARoxetine (PAXIL) 10 MG tablet Take 1 tablet (10 mg total) by mouth every morning. (Patient taking differently: Take 10 mg by mouth every other day. ) 90 tablet 4  . PARoxetine (PAXIL) 20 MG tablet TAKE 1 TABLET BY MOUTH EVERY DAY (Patient taking differently: every other day. ) 100 tablet 4  . polyethylene glycol (MIRALAX / GLYCOLAX) 17 g packet Take 17 g by mouth daily as needed.    . triamcinolone (KENALOG) 0.025 % ointment Apply topically 2 (two) times daily. (Patient taking differently: Apply 1 application topically 2 (two) times daily as needed (for ezcema.). ) 80 g 2  . valACYclovir (VALTREX) 500 MG tablet Take 1 tablet (500 mg total) by mouth daily. (Patient taking differently: Take 500 mg by mouth daily as needed. ) 90 tablet 1   No facility-administered medications prior to visit.    Past Medical History:  Diagnosis Date  . Arthritis   . Cancer ([place])    breast  . Depression   . GERD (gastroesophageal reflux disease)   . Hyperlipidemia   . Tendonitis    Past Surgical History:  Procedure Laterality Date  . BREAST SURGERY  right  . ELBOW SURGERY     right   Social History   Socioeconomic History  . Marital status: Married    Spouse name: [name]  . Number of children: Not on file  . Years of education: Not on file  . Highest education level: Not on file  Occupational History  . Not on file  Social Needs  . Financial resource strain: Not very hard  . Food insecurity    Worry: Never true    Inability: Never true  . Transportation needs    Medical: No    Non-medical: No  Tobacco Use  . Smoking status: Never Smoker  . Smokeless tobacco: Never Used  Substance and Sexual Activity  . Alcohol use: Yes    Comment: 1-2 glasses of wine a month  . Drug  use: No  . Sexual activity: Yes    Birth control/protection: Post-menopausal  Lifestyle  . Physical activity    Days per week: 0 days    Minutes per session: 0 min  . Stress: Only a little  Relationships  . Social Herbalist on phone: Twice a week    Gets together: Twice a week    Attends religious service: Not on file    Active member of club or organization: Not on file    Attends meetings of clubs or organizations: Not on file    Relationship status: Not on file  Other Topics Concern  . Not on file  Social History Narrative  . Not on file   Family History  Problem Relation Age of Onset  . Cancer Mother        bone  . Hypertension Father   . Diabetes Father   . Stroke Father   . Hypertension Sister   . Alzheimer's disease Brother   . Hypertension Sister       Review of Systems: Pertinent positive and negative review of systems were noted in the above HPI section. All other review of systems were otherwise negative.    Physical Exam: General: Well developed, well nourished, no acute distress Head: Normocephalic and atraumatic Eyes:  sclerae anicteric, EOMI Ears: Normal auditory acuity Mouth: No deformity or lesions Neck: Supple, no masses or thyromegaly Lungs: Clear throughout to auscultation Heart: Regular rate and rhythm; no murmurs, rubs or bruits Abdomen: Soft, non tender and non distended. No masses, hepatosplenomegaly or hernias noted. Normal Bowel sounds Rectal: Deferred to colonoscopy Musculoskeletal: Symmetrical with no gross deformities  Skin: No lesions on visible extremities Pulses:  Normal pulses noted Extremities: No clubbing, cyanosis, edema or deformities noted Neurological: Alert oriented x 4, grossly nonfocal Cervical Nodes:  No significant cervical adenopathy Inguinal Nodes: No significant inguinal adenopathy Psychological:  Alert and cooperative. Normal mood and affect   Assessment and Recommendations:  1.  Acute colitis in  April likely infectious, consider ischemic.  Abnormal CT of the colon.  Hematochezia. All acute symptoms completely resolved.  Schedule colonoscopy for further evaluation. The risks (including bleeding, perforation, infection, missed lesions, medication reactions and possible hospitalization or surgery if complications occur), benefits, and alternatives to colonoscopy with possible biopsy and possible polypectomy were discussed with the patient and they consent to proceed.   2.  Change in bowel habits with persistent constipation since April.  Begin either MOM daily or MiraLAX daily titrated for a complete bowel movement every day or every other day.  Colonoscopy as above.   cc: Luetta Nutting, DO [address] [place],  [place] [address]

## 2019-08-15 NOTE — Patient Instructions (Signed)
You can take over the counter Miralax or milk of magnesia daily for constipation.  You have been scheduled for a colonoscopy. Please follow written instructions given to you at your visit today.  Please pick up your prep supplies at the pharmacy within the next 1-3 days. If you use inhalers (even only as needed), please bring them with you on the day of your procedure. Your physician has requested that you go to www.startemmi.com and enter the access code given to you at your visit today. This web site gives a general overview about your procedure. However, you should still follow specific instructions given to you by our office regarding your preparation for the procedure.  Normal BMI (Body Mass Index- based on height and weight) is between 23 and 30. Your BMI today is Body mass index is 26.3 kg/m. Marland Kitchen Please consider follow up  regarding your BMI with your Primary Care Provider.  Thank you for choosing me and [facility].  Pricilla Riffle. Dagoberto Ligas., MD., [facility]

## 2019-08-28 ENCOUNTER — Telehealth: Payer: Self-pay | Admitting: Gastroenterology

## 2019-08-28 NOTE — Telephone Encounter (Signed)
Do you now or have you had a fever in the last 14 days?    No ° °  ° °Do you have any respiratory symptoms of shortness of breath or cough now or in the last 14 days?   No ° °  ° °Do you have any family members or close contacts with diagnosed or suspected Covid-19 in the past 14 days?   No  ° °  ° °Have you been tested for Covid-19 and found to be positive?   No ° °  ° °Pt made aware to check in on theth floor and that care partner may wait in the car or come up to the lobby during the procedure but will need to provide their own mask. °

## 2019-08-29 ENCOUNTER — Encounter: Payer: Self-pay | Admitting: Gastroenterology

## 2019-08-29 ENCOUNTER — Ambulatory Visit (AMBULATORY_SURGERY_CENTER): Payer: Medicare Other | Admitting: Gastroenterology

## 2019-08-29 ENCOUNTER — Other Ambulatory Visit: Payer: Self-pay

## 2019-08-29 VITALS — BP 129/58 | HR 69 | Temp 98.3°F | Resp 23 | Ht 68.0 in | Wt 173.0 lb

## 2019-08-29 DIAGNOSIS — R933 Abnormal findings on diagnostic imaging of other parts of digestive tract: Secondary | ICD-10-CM

## 2019-08-29 DIAGNOSIS — K921 Melena: Secondary | ICD-10-CM

## 2019-08-29 DIAGNOSIS — K219 Gastro-esophageal reflux disease without esophagitis: Secondary | ICD-10-CM | POA: Diagnosis not present

## 2019-08-29 DIAGNOSIS — E785 Hyperlipidemia, unspecified: Secondary | ICD-10-CM | POA: Diagnosis not present

## 2019-08-29 DIAGNOSIS — D123 Benign neoplasm of transverse colon: Secondary | ICD-10-CM

## 2019-08-29 DIAGNOSIS — K620 Anal polyp: Secondary | ICD-10-CM | POA: Diagnosis not present

## 2019-08-29 DIAGNOSIS — K621 Rectal polyp: Secondary | ICD-10-CM | POA: Diagnosis not present

## 2019-08-29 DIAGNOSIS — D128 Benign neoplasm of rectum: Secondary | ICD-10-CM

## 2019-08-29 DIAGNOSIS — K635 Polyp of colon: Secondary | ICD-10-CM

## 2019-08-29 MED ORDER — SODIUM CHLORIDE 0.9 % IV SOLN: 500.0000 mL | Freq: Once | INTRAVENOUS | Status: DC

## 2019-08-29 NOTE — Op Note (Signed)
[facility] Patient Name: Cindy [name] Procedure Date: 08/29/2019 2:42 PM MRN: [id number] Endoscopist: Ladene Artist , MD Age: 68 Referring MD:  Date of Birth: 08/01/1951 Gender: Female Account #: [id number] Procedure:                Colonoscopy Indications:              Hematochezia, Abnormal CT of the GI tract-colon Medicines:                Monitored Anesthesia Care Procedure:                Pre-Anesthesia Assessment:                           - Prior to the procedure, a History and Physical                            was performed, and patient medications and                            allergies were reviewed. The patient's tolerance of                            previous anesthesia was also reviewed. The risks                            and benefits of the procedure and the sedation                            options and risks were discussed with the patient.                            All questions were answered, and informed consent                            was obtained. Prior Anticoagulants: The patient has                            taken no previous anticoagulant or antiplatelet                            agents. ASA Grade Assessment: II - A patient with                            mild systemic disease. After reviewing the risks                            and benefits, the patient was deemed in                            satisfactory condition to undergo the procedure.                           After obtaining informed consent, the colonoscope  was passed under direct vision. Throughout the                            procedure, the patient's blood pressure, pulse, and                            oxygen saturations were monitored continuously. The                            Colonoscope was introduced through the anus and                            advanced to the the cecum, identified by                            appendiceal orifice  and ileocecal valve. The                            ileocecal valve, appendiceal orifice, and rectum                            were photographed. The quality of the bowel                            preparation was good. The colonoscopy was performed                            without difficulty. The patient tolerated the                            procedure well. Scope In: 2:55:00 PM Scope Out: 3:12:47 PM Scope Withdrawal Time: 0 hours 13 minutes 28 seconds  Total Procedure Duration: 0 hours 17 minutes 47 seconds  Findings:                 The perianal and digital rectal examinations were                            normal.                           A 8 mm polyp was found in the transverse colon. The                            polyp was semi-pedunculated. The polyp was removed                            with a cold snare. Resection and retrieval were                            complete.                           A 4 mm polyp was found in the rectum. The polyp was  sessile. The polyp was removed with a cold biopsy                            forceps. Resection and retrieval were complete.                           Anal papilla(e) were hypertrophied.                           The exam was otherwise without abnormality on                            direct and retroflexion views. Complications:            No immediate complications. Estimated blood loss:                            None. Estimated Blood Loss:     Estimated blood loss: none. Impression:               - One 8 mm polyp in the transverse colon, removed                            with a cold snare. Resected and retrieved.                           - One 4 mm polyp in the rectum, removed with a cold                            biopsy forceps. Resected and retrieved.                           - Anal papilla(e) were hypertrophied.                           - The examination was otherwise normal on direct                             and retroflexion views. Recommendation:           - Repeat colonoscopy date to be determined after                            pending pathology results are reviewed.                           - Patient has a contact number available for                            emergencies. The signs and symptoms of potential                            delayed complications were discussed with the                            patient. Return to  normal activities tomorrow.                            Written discharge instructions were provided to the                            patient.                           - Resume previous diet.                           - Continue present medications.                           - Await pathology results. Ladene Artist, MD 08/29/2019 3:18:30 PM This report has been signed electronically.

## 2019-08-29 NOTE — Progress Notes (Signed)
Temp check by AM/vital check by CW.  No health or surgical history changes noted.

## 2019-08-29 NOTE — Progress Notes (Signed)
Called to room to assist during endoscopic procedure.  Patient ID and intended procedure confirmed with present staff. Received instructions for my participation in the procedure from the performing physician.  

## 2019-08-29 NOTE — Patient Instructions (Signed)
Read all of the handouts given to you by your recovery room nurse.  Thank-you for choosing us for your healthcare needs today.  YOU HAD AN ENDOSCOPIC PROCEDURE TODAY AT THE [facility]:   Refer to the procedure report that was given to you for any specific questions about what was found during the examination.  If the procedure report does not answer your questions, please call your gastroenterologist to clarify.  If you requested that your care partner not be given the details of your procedure findings, then the procedure report has been included in a sealed envelope for you to review at your convenience later.  YOU SHOULD EXPECT: Some feelings of bloating in the abdomen. Passage of more gas than usual.  Walking can help get rid of the air that was put into your GI tract during the procedure and reduce the bloating. If you had a lower endoscopy (such as a colonoscopy or flexible sigmoidoscopy) you may notice spotting of blood in your stool or on the toilet paper. If you underwent a bowel prep for your procedure, you may not have a normal bowel movement for a few days.  Please Note:  You might notice some irritation and congestion in your nose or some drainage.  This is from the oxygen used during your procedure.  There is no need for concern and it should clear up in a day or so.  SYMPTOMS TO REPORT IMMEDIATELY:   Following lower endoscopy (colonoscopy or flexible sigmoidoscopy):  Excessive amounts of blood in the stool  Significant tenderness or worsening of abdominal pains  Swelling of the abdomen that is new, acute  Fever of 100F or higher   For urgent or emergent issues, a gastroenterologist can be reached at any hour by calling [phone].   DIET:  We do recommend a small meal at first, but then you may proceed to your regular diet.  Drink plenty of fluids but you should avoid alcoholic beverages for 24 hours.  ACTIVITY:  You should plan to take it easy for the rest of  today and you should NOT DRIVE or use heavy machinery until tomorrow (because of the sedation medicines used during the test).    FOLLOW UP: Our staff will call the number listed on your records 48-72 hours following your procedure to check on you and address any questions or concerns that you may have regarding the information given to you following your procedure. If we do not reach you, we will leave a message.  We will attempt to reach you two times.  During this call, we will ask if you have developed any symptoms of COVID 19. If you develop any symptoms (ie: fever, flu-like symptoms, shortness of breath, cough etc.) before then, please call [phone].  If you test positive for Covid 19 in the 2 weeks post procedure, please call and report this information to us.    If any biopsies were taken you will be contacted by phone or by letter within the next 1-3 weeks.  Please call us at [phone] if you have not heard about the biopsies in 3 weeks.    SIGNATURES/CONFIDENTIALITY: You and/or your care partner have signed paperwork which will be entered into your electronic medical record.  These signatures attest to the fact that that the information above on your After Visit Summary has been reviewed and is understood.  Full responsibility of the confidentiality of this discharge information lies with you and/or your care-partner. 

## 2019-08-29 NOTE — Progress Notes (Signed)
Report to PACU, RN, vss, BBS= Clear.  

## 2019-08-30 DIAGNOSIS — Z23 Encounter for immunization: Secondary | ICD-10-CM | POA: Diagnosis not present

## 2019-08-31 ENCOUNTER — Telehealth: Payer: Self-pay

## 2019-08-31 NOTE — Telephone Encounter (Signed)
Left message on answering machine. 

## 2019-08-31 NOTE — Telephone Encounter (Signed)
Pt is returning your call and said she "feels feverish" but is not running a temp and aching all over  [phone]

## 2019-08-31 NOTE — Telephone Encounter (Signed)
Attempted to reach pt. With follow-up call following endoscopic procedure 08/29/2019.  LM on pt. Voice mail to call if she has any questions or concerns.

## 2019-09-04 ENCOUNTER — Encounter: Payer: Self-pay | Admitting: Gastroenterology

## 2019-09-21 ENCOUNTER — Other Ambulatory Visit: Payer: Self-pay | Admitting: Family Medicine

## 2019-09-21 DIAGNOSIS — K529 Noninfective gastroenteritis and colitis, unspecified: Secondary | ICD-10-CM

## 2019-10-12 ENCOUNTER — Encounter: Payer: Self-pay | Admitting: Family Medicine

## 2019-10-13 ENCOUNTER — Other Ambulatory Visit: Payer: Self-pay | Admitting: Family Medicine

## 2019-10-13 MED ORDER — NITROFURANTOIN MONOHYD MACRO 100 MG PO CAPS: 100.0000 mg | ORAL_CAPSULE | Freq: Two times a day (BID) | ORAL | 0 refills | Status: AC

## 2019-10-20 ENCOUNTER — Other Ambulatory Visit: Payer: Self-pay | Admitting: Family Medicine

## 2019-10-20 DIAGNOSIS — K529 Noninfective gastroenteritis and colitis, unspecified: Secondary | ICD-10-CM

## 2019-10-28 ENCOUNTER — Encounter: Payer: Self-pay | Admitting: Family Medicine

## 2019-10-30 ENCOUNTER — Other Ambulatory Visit: Payer: Self-pay

## 2019-10-30 ENCOUNTER — Encounter: Payer: Self-pay | Admitting: Family Medicine

## 2019-10-30 ENCOUNTER — Ambulatory Visit (INDEPENDENT_AMBULATORY_CARE_PROVIDER_SITE_OTHER): Payer: Medicare Other | Admitting: Family Medicine

## 2019-10-30 VITALS — BP 120/78 | HR 77 | Temp 97.1°F | Ht 68.0 in | Wt 173.4 lb

## 2019-10-30 DIAGNOSIS — R350 Frequency of micturition: Secondary | ICD-10-CM | POA: Diagnosis not present

## 2019-10-30 DIAGNOSIS — N3090 Cystitis, unspecified without hematuria: Secondary | ICD-10-CM | POA: Diagnosis not present

## 2019-10-30 LAB — POCT URINALYSIS DIPSTICK
Bilirubin, UA: NEGATIVE
Blood, UA: NEGATIVE
Glucose, UA: NEGATIVE
Ketones, UA: NEGATIVE
Leukocytes, UA: NEGATIVE
Nitrite, UA: POSITIVE
Protein, UA: NEGATIVE
Spec Grav, UA: 1.015 (ref 1.010–1.025)
pH, UA: 6 (ref 5.0–8.0)

## 2019-10-30 MED ORDER — NITROFURANTOIN MONOHYD MACRO 100 MG PO CAPS: 100.0000 mg | ORAL_CAPSULE | Freq: Two times a day (BID) | ORAL | 0 refills | Status: AC

## 2019-10-30 NOTE — Assessment & Plan Note (Signed)
-  Will restart macrobid, send urine for culture and update if needed based on results.  -She is aware to call or message through mychart for any new or worsening symptoms.

## 2019-10-30 NOTE — Telephone Encounter (Signed)
Pt schedule for appt today.

## 2019-10-30 NOTE — Progress Notes (Signed)
Cindy [name] - 68 y.o. female MRN [id number]  Date of birth: [birth year]  Subjective Chief Complaint  Patient presents with  . Urinary Tract Infection    pt symptoms started 10/30 meds worked. Last sat pt woke up to frequent urintions pain and burning.    HPI Cindy [name] is a 68 y.o. female here today with complaint of UTI symptoms.  Her current symptoms include urinary frequency, urgency and dysuria.  She was treated for UTI with macrobid at the end of October with improvement of her symptoms.  She does report that she forgot to take last day of antibiotic.  She restarted the macrobid she had from previous UTI and reports some improvement of symptoms today.  She also has been taking pyridium.  She denies fever, chills, flank pain or vaginal discharge.   ROS:  A comprehensive ROS was completed and negative except as noted per HPI  No Known Allergies  Past Medical History:  Diagnosis Date  . Arthritis   . Cancer ([place])    breast  . Depression   . GERD (gastroesophageal reflux disease)   . Hyperlipidemia   . Tendonitis     Past Surgical History:  Procedure Laterality Date  . BREAST SURGERY     right  . ELBOW SURGERY     right    Social History   Socioeconomic History  . Marital status: Married    Spouse name: [name]  . Number of children: Not on file  . Years of education: Not on file  . Highest education level: Not on file  Occupational History  . Not on file  Social Needs  . Financial resource strain: Not very hard  . Food insecurity    Worry: Never true    Inability: Never true  . Transportation needs    Medical: No    Non-medical: No  Tobacco Use  . Smoking status: Never Smoker  . Smokeless tobacco: Never Used  Substance and Sexual Activity  . Alcohol use: Yes    Comment: 1-2 glasses of wine a month  . Drug use: No  . Sexual activity: Yes    Birth control/protection: Post-menopausal  Lifestyle  . Physical activity    Days per week: 0 days   Minutes per session: 0 min  . Stress: Only a little  Relationships  . Social Herbalist on phone: Twice a week    Gets together: Twice a week    Attends religious service: Not on file    Active member of club or organization: Not on file    Attends meetings of clubs or organizations: Not on file    Relationship status: Not on file  Other Topics Concern  . Not on file  Social History Narrative  . Not on file    Family History  Problem Relation Age of Onset  . Cancer Mother        bone  . Hypertension Father   . Diabetes Father   . Stroke Father   . Hypertension Sister   . Alzheimer's disease Brother   . Hypertension Sister   . Colon cancer Neg Hx   . Esophageal cancer Neg Hx   . Rectal cancer Neg Hx   . Stomach cancer Neg Hx     Health Maintenance  Topic Date Due  . MAMMOGRAM  07/02/2020  . TETANUS/TDAP  04/05/2028  . COLONOSCOPY  08/28/2029  . INFLUENZA VACCINE  Completed  . DEXA SCAN  Completed  .  Hepatitis C Screening  Completed  . PNA vac Low Risk Adult  Completed    ----------------------------------------------------------------------------------------------------------------------------------------------------------------------------------------------------------------- Physical Exam BP 120/78   Pulse 77   Temp (!) 97.1 F (36.2 C) (Temporal)   Ht 5\' 8"  (1.727 m)   Wt 173 lb 6.4 oz (78.7 kg)   SpO2 99%   BMI 26.37 kg/m   Physical Exam Constitutional:      Appearance: Normal appearance.  HENT:     Head: Atraumatic.  Eyes:     General: No scleral icterus. Cardiovascular:     Rate and Rhythm: Normal rate and regular rhythm.  Abdominal:     Tenderness: There is no right CVA tenderness.  Skin:    General: Skin is warm and dry.  Neurological:     General: No focal deficit present.     Mental Status: She is alert.  Psychiatric:        Mood and Affect: Mood normal.        Behavior: Behavior normal.      ------------------------------------------------------------------------------------------------------------------------------------------------------------------------------------------------------------------- Assessment and Plan  Cystitis -Will restart macrobid, send urine for culture and update if needed based on results.  -She is aware to call or message through mychart for any new or worsening symptoms.

## 2019-10-31 LAB — URINE CULTURE
MICRO NUMBER:: 1104397
Result:: NO GROWTH
SPECIMEN QUALITY:: ADEQUATE

## 2019-11-02 NOTE — Progress Notes (Signed)
UA without growth, likely because she had taken the two doses of macrobid.  I would recommend she complete current course.  Let me know if symptoms are not resolving.

## 2019-11-03 ENCOUNTER — Other Ambulatory Visit: Payer: Self-pay | Admitting: Family Medicine

## 2019-11-03 DIAGNOSIS — K529 Noninfective gastroenteritis and colitis, unspecified: Secondary | ICD-10-CM

## 2019-11-15 ENCOUNTER — Encounter: Payer: Self-pay | Admitting: Family Medicine

## 2019-11-16 ENCOUNTER — Other Ambulatory Visit: Payer: Medicare Other

## 2019-11-16 ENCOUNTER — Encounter: Payer: Self-pay | Admitting: Family Medicine

## 2019-11-16 ENCOUNTER — Other Ambulatory Visit: Payer: Self-pay

## 2019-11-16 ENCOUNTER — Telehealth (INDEPENDENT_AMBULATORY_CARE_PROVIDER_SITE_OTHER): Payer: Medicare Other | Admitting: Family Medicine

## 2019-11-16 VITALS — HR 84 | Ht 68.0 in | Wt 172.0 lb

## 2019-11-16 DIAGNOSIS — R350 Frequency of micturition: Secondary | ICD-10-CM | POA: Insufficient documentation

## 2019-11-16 LAB — POCT URINALYSIS DIPSTICK
Blood, UA: NEGATIVE
Glucose, UA: NEGATIVE
Ketones, UA: NEGATIVE
Nitrite, UA: POSITIVE
Protein, UA: NEGATIVE
Spec Grav, UA: 1.015 (ref 1.010–1.025)
Urobilinogen, UA: 1 E.U./dL
pH, UA: 6 (ref 5.0–8.0)

## 2019-11-16 NOTE — Progress Notes (Signed)
Cindy [name] - 68 y.o. female MRN [id number]  Date of birth: December 10, 1951   This visit type was conducted due to national recommendations for restrictions regarding the COVID-19 Pandemic (e.g. social distancing).  This format is felt to be most appropriate for this patient at this time.  All issues noted in this document were discussed and addressed.  No physical exam was performed (except for noted visual exam findings with Video Visits).  I discussed the limitations of evaluation and management by telemedicine and the availability of in person appointments. The patient expressed understanding and agreed to proceed.  I connected with@ on 11/16/19 at  8:00 AM EST by a video enabled telemedicine application and verified that I am speaking with the correct person using two identifiers.  Present at visit: Luetta Nutting, DO [name]   Patient Location: Home [address] [place] [address]   Provider location:   Home Office  Chief Complaint  Patient presents with  . Urinary Tract Infection    pt is c/o reoccure uti--twice in the last 1 mo--frequent urinate and urgency-- day/took peridium/spouse can bring urine sample--has to watch little baby today    HPI  Cindy [name] is a 68 y.o. female who presents via audio/video conferencing for a telehealth visit today.  She has complaint of urinary frequency and urgency.  Symptoms similar to previous UTI.  Has completed course of macrobid x2 and had resolution for a few days but symptoms return shortly after.  She took pyridium last night and she hasn't had symptoms so far today. She denies fever, chills, flank pain, nausea, vomiting, diarrhea, vaginal discharge.   ROS:  A comprehensive ROS was completed and negative except as noted per HPI  Past Medical History:  Diagnosis Date  . Arthritis   . Cancer ([place])    breast  . Depression   . GERD (gastroesophageal reflux disease)   . Hyperlipidemia   . Tendonitis     Past Surgical  History:  Procedure Laterality Date  . BREAST SURGERY     right  . ELBOW SURGERY     right    Family History  Problem Relation Age of Onset  . Cancer Mother        bone  . Hypertension Father   . Diabetes Father   . Stroke Father   . Hypertension Sister   . Alzheimer's disease Brother   . Hypertension Sister   . Colon cancer Neg Hx   . Esophageal cancer Neg Hx   . Rectal cancer Neg Hx   . Stomach cancer Neg Hx     Social History   Socioeconomic History  . Marital status: Married    Spouse name: [name]  . Number of children: Not on file  . Years of education: Not on file  . Highest education level: Not on file  Occupational History  . Not on file  Social Needs  . Financial resource strain: Not very hard  . Food insecurity    Worry: Never true    Inability: Never true  . Transportation needs    Medical: No    Non-medical: No  Tobacco Use  . Smoking status: Never Smoker  . Smokeless tobacco: Never Used  Substance and Sexual Activity  . Alcohol use: Yes    Comment: 1-2 glasses of wine a month  . Drug use: No  . Sexual activity: Yes    Birth control/protection: Post-menopausal  Lifestyle  . Physical activity    Days  per week: 0 days    Minutes per session: 0 min  . Stress: Only a little  Relationships  . Social Herbalist on phone: Twice a week    Gets together: Twice a week    Attends religious service: Not on file    Active member of club or organization: Not on file    Attends meetings of clubs or organizations: Not on file    Relationship status: Not on file  . Intimate partner violence    Fear of current or ex partner: No    Emotionally abused: No    Physically abused: No    Forced sexual activity: Not on file  Other Topics Concern  . Not on file  Social History Narrative  . Not on file     Current Outpatient Medications:  .  buPROPion (WELLBUTRIN XL) 150 MG 24 hr tablet, TAKE 3 TABLETS BY MOUTH DAILY, Disp: 270 tablet, Rfl: 4 .   Calcium Carbonate-Vit D-Min (CALCIUM 1200 PO), Take 1 tablet by mouth daily. , Disp: , Rfl:  .  Cholecalciferol (VITAMIN D3 PO), Take by mouth., Disp: , Rfl:  .  clonazePAM (KLONOPIN) 1 MG tablet, TAKE 1 TABLET BY MOUTH 2 TIMES A DAY, Disp: 60 tablet, Rfl: 0 .  dicyclomine (BENTYL) 20 MG tablet, TAKE 1 TABLET BY MOUTH 3 TIMES DAILY AS NEEDED SPASMS (ABDOMINAL CRAMPING), Disp: 20 tablet, Rfl: 0 .  docusate sodium (COLACE) 100 MG capsule, Take 100 mg by mouth at bedtime., Disp: , Rfl:  .  lovastatin (MEVACOR) 20 MG tablet, Take 1 tablet (20 mg total) by mouth at bedtime., Disp: 100 tablet, Rfl: 4 .  Multiple Vitamin (MULTIVITAMIN) tablet, Take 1 tablet by mouth daily.  , Disp: , Rfl:  .  omeprazole (PRILOSEC) 40 MG capsule, TAKE 1 CAPSULE BY MOUTH DAILY, Disp: 90 capsule, Rfl: 4 .  ondansetron (ZOFRAN ODT) 4 MG disintegrating tablet, Take 1 tablet (4 mg total) by mouth every 8 (eight) hours as needed., Disp: 20 tablet, Rfl: 0 .  PARoxetine (PAXIL) 10 MG tablet, Take 1 tablet (10 mg total) by mouth every morning., Disp: 90 tablet, Rfl: 4 .  PARoxetine (PAXIL) 20 MG tablet, TAKE 1 TABLET BY MOUTH EVERY DAY (Patient taking differently: every other day. ), Disp: 100 tablet, Rfl: 4 .  polyethylene glycol (MIRALAX / GLYCOLAX) 17 g packet, Take 17 g by mouth daily as needed., Disp: , Rfl:  .  valACYclovir (VALTREX) 500 MG tablet, Take 1 tablet (500 mg total) by mouth daily. (Patient taking differently: Take 500 mg by mouth daily as needed. ), Disp: 90 tablet, Rfl: 1 .  Zinc Sulfate (ZINC 15 PO), Take by mouth., Disp: , Rfl:   EXAM:  VITALS per patient if applicable: Ht 5\' 8"  (1.727 m)   Wt 172 lb (78 kg) Comment: pt reprt  BMI 26.15 kg/m   GENERAL: alert, oriented, appears well and in no acute distress  HEENT: atraumatic, conjunttiva clear, no obvious abnormalities on inspection of external nose and ears  NECK: normal movements of the head and neck  LUNGS: on inspection no signs of respiratory  distress, breathing rate appears normal, no obvious gross SOB, gasping or wheezing  CV: no obvious cyanosis  MS: moves all visible extremities without noticeable abnormality  PSYCH/NEURO: pleasant and cooperative, no obvious depression or anxiety, speech and thought processing grossly intact  ASSESSMENT AND PLAN:  Discussed the following assessment and plan:  Urinary frequency -Recurrent problem.  -Symptoms improved today.  She will stop by the clinic and leave a urine sample.  Will send to be cultured. If signs of UTI will go ahead and treat empirically.  -Discussed referral to urology if symptoms persist or continue to reoccur.      I discussed the assessment and treatment plan with the patient. The patient was provided an opportunity to ask questions and all were answered. The patient agreed with the plan and demonstrated an understanding of the instructions.   The patient was advised to call back or seek an in-person evaluation if the symptoms worsen or if the condition fails to improve as anticipated.    Luetta Nutting, DO

## 2019-11-16 NOTE — Assessment & Plan Note (Signed)
-  Recurrent problem.  -Symptoms improved today.  She will stop by the clinic and leave a urine sample.  Will send to be cultured. If signs of UTI will go ahead and treat empirically.  -Discussed referral to urology if symptoms persist or continue to reoccur.

## 2019-11-17 ENCOUNTER — Other Ambulatory Visit: Payer: Self-pay | Admitting: Family Medicine

## 2019-11-17 ENCOUNTER — Encounter: Payer: Self-pay | Admitting: Family Medicine

## 2019-11-17 MED ORDER — CIPROFLOXACIN HCL 500 MG PO TABS: 500.0000 mg | ORAL_TABLET | Freq: Two times a day (BID) | ORAL | 0 refills | Status: DC

## 2019-11-18 LAB — URINE CULTURE
MICRO NUMBER:: 1160244
SPECIMEN QUALITY:: ADEQUATE

## 2019-11-23 ENCOUNTER — Encounter: Payer: Self-pay | Admitting: Family Medicine

## 2019-11-24 ENCOUNTER — Other Ambulatory Visit: Payer: Self-pay | Admitting: Family Medicine

## 2019-11-24 MED ORDER — CIPROFLOXACIN HCL 500 MG PO TABS: 500.0000 mg | ORAL_TABLET | Freq: Two times a day (BID) | ORAL | 0 refills | Status: DC

## 2019-12-18 ENCOUNTER — Encounter: Payer: Self-pay | Admitting: Family Medicine

## 2019-12-19 ENCOUNTER — Telehealth: Payer: Self-pay

## 2019-12-19 NOTE — Telephone Encounter (Signed)
Questions for Screening COVID-19 Travel or Contacts: no   During this illness, did/does the patient experience any of the following symptoms? Fever >100.47F []   Yes [x]   No []   Unknown Subjective fever (felt feverish) []   Yes [x]   No []   Unknown Chills []   Yes [x]   No []   Unknown Muscle aches (myalgia) []   Yes [x]   No []   Unknown Runny nose (rhinorrhea) []   Yes [x]   No []   Unknown Sore throat []   Yes [x]   No []   Unknown Cough (new onset or worsening of chronic cough) []   Yes [x]   No []   Unknown Shortness of breath (dyspnea) []   Yes [x]   No []   Unknown Nausea or vomiting []   Yes [x]   No []   Unknown Headache []   Yes [x]   No []   Unknown Abdominal pain  []   Yes [x]   No []   Unknown Diarrhea (?3 loose/looser than normal stools/24hr period) []   Yes [x]   No []   Unknown

## 2019-12-20 ENCOUNTER — Other Ambulatory Visit: Payer: Self-pay

## 2019-12-20 ENCOUNTER — Encounter: Payer: Self-pay | Admitting: Family Medicine

## 2019-12-20 ENCOUNTER — Ambulatory Visit (INDEPENDENT_AMBULATORY_CARE_PROVIDER_SITE_OTHER): Payer: Medicare Other | Admitting: Family Medicine

## 2019-12-20 VITALS — BP 118/80 | HR 79 | Temp 97.4°F | Ht 68.0 in | Wt 172.0 lb

## 2019-12-20 DIAGNOSIS — N390 Urinary tract infection, site not specified: Secondary | ICD-10-CM | POA: Diagnosis not present

## 2019-12-20 DIAGNOSIS — N3090 Cystitis, unspecified without hematuria: Secondary | ICD-10-CM | POA: Diagnosis not present

## 2019-12-20 LAB — POCT URINALYSIS DIPSTICK
Bilirubin, UA: NEGATIVE
Blood, UA: NEGATIVE
Glucose, UA: NEGATIVE
Ketones, UA: NEGATIVE
Leukocytes, UA: NEGATIVE
Nitrite, UA: NEGATIVE
Protein, UA: NEGATIVE
Spec Grav, UA: 1.02 (ref 1.010–1.025)
Urobilinogen, UA: 1 E.U./dL
pH, UA: 6 (ref 5.0–8.0)

## 2019-12-20 NOTE — Assessment & Plan Note (Addendum)
UA is unremarkable today, will send for culture Complete 3 day course of cipro.  Referral to urology for recurrent UTI.

## 2019-12-20 NOTE — Progress Notes (Signed)
Cindy [name] - 69 y.o. female MRN [id number]  Date of birth: 06/03/1951  Subjective Chief Complaint  Patient presents with  . Urinary Tract Infection    Burning, and pain after urinating for a few days. third uti since oct.    HPI :Cindy [name] is a 69 y.o. female who complains of urinary frequency, urgency and dysuria x 3 days, without flank pain, fever, chills, or abnormal vaginal discharge or bleeding.   She has had recurrent UTI's since October.  She had a prescription for cipro that she started when her symptoms began and her symptoms have improved.  She is concerned about recurrence.   ROS:  A comprehensive ROS was completed and negative except as noted per HPI     No Known Allergies  Past Medical History:  Diagnosis Date  . Arthritis   . Cancer ([place])    breast  . Depression   . GERD (gastroesophageal reflux disease)   . Hyperlipidemia   . Tendonitis     Past Surgical History:  Procedure Laterality Date  . BREAST SURGERY     right  . ELBOW SURGERY     right    Social History   Socioeconomic History  . Marital status: Married    Spouse name: [name]  . Number of children: Not on file  . Years of education: Not on file  . Highest education level: Not on file  Occupational History  . Not on file  Tobacco Use  . Smoking status: Never Smoker  . Smokeless tobacco: Never Used  Substance and Sexual Activity  . Alcohol use: Yes    Comment: 1-2 glasses of wine a month  . Drug use: No  . Sexual activity: Yes    Birth control/protection: Post-menopausal  Other Topics Concern  . Not on file  Social History Narrative  . Not on file   Social Determinants of Health   Financial Resource Strain: Low Risk   . Difficulty of Paying Living Expenses: Not very hard  Food Insecurity: No Food Insecurity  . Worried About Charity fundraiser in the Last Year: Never true  . Ran Out of Food in the Last Year: Never true  Transportation Needs: No Transportation Needs  .  Lack of Transportation (Medical): No  . Lack of Transportation (Non-Medical): No  Physical Activity: Inactive  . Days of Exercise per Week: 0 days  . Minutes of Exercise per Session: 0 min  Stress: No Stress Concern Present  . Feeling of Stress : Only a little  Social Connections: Unknown  . Frequency of Communication with Friends and Family: Twice a week  . Frequency of Social Gatherings with Friends and Family: Twice a week  . Attends Religious Services: Not on file  . Active Member of Clubs or Organizations: Not on file  . Attends Archivist Meetings: Not on file  . Marital Status: Not on file    Family History  Problem Relation Age of Onset  . Cancer Mother        bone  . Hypertension Father   . Diabetes Father   . Stroke Father   . Hypertension Sister   . Alzheimer's disease Brother   . Hypertension Sister   . Colon cancer Neg Hx   . Esophageal cancer Neg Hx   . Rectal cancer Neg Hx   . Stomach cancer Neg Hx     Health Maintenance  Topic Date Due  . MAMMOGRAM  07/02/2020  . TETANUS/TDAP  04/05/2028  . COLONOSCOPY  08/28/2029  . INFLUENZA VACCINE  Completed  . DEXA SCAN  Completed  . Hepatitis C Screening  Completed  . PNA vac Low Risk Adult  Completed    ----------------------------------------------------------------------------------------------------------------------------------------------------------------------------------------------------------------- Physical Exam BP 118/80   Pulse 79   Temp (!) 97.4 F (36.3 C) (Temporal)   Ht 5\' 8"  (1.727 m)   Wt 172 lb (78 kg)   SpO2 97%   BMI 26.15 kg/m   Physical Exam Constitutional:      Appearance: Normal appearance.  HENT:     Head: Normocephalic and atraumatic.  Eyes:     General: No scleral icterus. Abdominal:     Tenderness: There is no right CVA tenderness or left CVA tenderness.  Neurological:     General: No focal deficit present.  Psychiatric:        Mood and Affect: Mood  normal.        Behavior: Behavior normal.     ------------------------------------------------------------------------------------------------------------------------------------------------------------------------------------------------------------------- Assessment and Plan  Cystitis UA is unremarkable today, will send for culture Complete 3 day course of cipro.  Referral to urology for recurrent UTI.

## 2019-12-21 ENCOUNTER — Encounter: Payer: Self-pay | Admitting: Family Medicine

## 2019-12-21 ENCOUNTER — Other Ambulatory Visit: Payer: Self-pay | Admitting: Family Medicine

## 2019-12-21 LAB — URINE CULTURE
MICRO NUMBER:: 10013197
Result:: NO GROWTH
SPECIMEN QUALITY:: ADEQUATE

## 2019-12-21 MED ORDER — CIPROFLOXACIN HCL 500 MG PO TABS: 500.0000 mg | ORAL_TABLET | Freq: Two times a day (BID) | ORAL | 0 refills | Status: DC

## 2019-12-22 ENCOUNTER — Encounter: Payer: Self-pay | Admitting: Family Medicine

## 2019-12-29 ENCOUNTER — Other Ambulatory Visit: Payer: Self-pay | Admitting: Family Medicine

## 2019-12-29 MED ORDER — CIPROFLOXACIN HCL 500 MG PO TABS: 500.0000 mg | ORAL_TABLET | Freq: Two times a day (BID) | ORAL | 0 refills | Status: DC

## 2020-01-10 ENCOUNTER — Other Ambulatory Visit: Payer: Self-pay | Admitting: Family Medicine

## 2020-01-10 ENCOUNTER — Other Ambulatory Visit: Payer: Self-pay

## 2020-01-10 DIAGNOSIS — K529 Noninfective gastroenteritis and colitis, unspecified: Secondary | ICD-10-CM

## 2020-01-10 MED ORDER — CLONAZEPAM 1 MG PO TABS: 1.0000 mg | ORAL_TABLET | Freq: Two times a day (BID) | ORAL | 0 refills | Status: DC

## 2020-01-10 MED ORDER — DICYCLOMINE HCL 20 MG PO TABS: ORAL_TABLET | ORAL | 0 refills | Status: DC

## 2020-01-10 NOTE — Telephone Encounter (Signed)
Last OV 12/20/19 Last fill for Bentyl 20mg  #20/0 Clonazepam 1mg  #60/0

## 2020-01-11 ENCOUNTER — Telehealth: Payer: Self-pay | Admitting: Family Medicine

## 2020-01-11 MED ORDER — BUPROPION HCL ER (XL) 150 MG PO TB24: 450.0000 mg | ORAL_TABLET | Freq: Every day | ORAL | 0 refills | Status: DC

## 2020-01-11 NOTE — Telephone Encounter (Signed)
Last office visit was 05/2019. Last refill sent was 12/29/2018--270 tab taking TID with 4 refill.   3 mo supply sent to [facility] today only. After this [facility] [address] [place]. Pt due for follow up in 05/2020.  Dr. Zigmund Daniel please advise, do you want me to do anything different.

## 2020-01-12 DIAGNOSIS — R351 Nocturia: Secondary | ICD-10-CM | POA: Diagnosis not present

## 2020-01-12 DIAGNOSIS — N390 Urinary tract infection, site not specified: Secondary | ICD-10-CM | POA: Diagnosis not present

## 2020-01-12 DIAGNOSIS — R350 Frequency of micturition: Secondary | ICD-10-CM | POA: Diagnosis not present

## 2020-01-22 DIAGNOSIS — R350 Frequency of micturition: Secondary | ICD-10-CM | POA: Diagnosis not present

## 2020-02-23 DIAGNOSIS — N390 Urinary tract infection, site not specified: Secondary | ICD-10-CM | POA: Diagnosis not present

## 2020-02-23 DIAGNOSIS — R351 Nocturia: Secondary | ICD-10-CM | POA: Diagnosis not present

## 2020-03-01 ENCOUNTER — Other Ambulatory Visit: Payer: Self-pay | Admitting: Family Medicine

## 2020-03-01 DIAGNOSIS — K529 Noninfective gastroenteritis and colitis, unspecified: Secondary | ICD-10-CM

## 2020-03-06 NOTE — Telephone Encounter (Signed)
Cindy [name] please help in absence of Dr. Zigmund Daniel.  Last refill was 01/10/2020 for 20 tabs and last ov was 12/20/2019.   No future appt made with new PCP yet.

## 2020-03-06 NOTE — Telephone Encounter (Signed)
Pt returned call and is planning to continue with Dr. Zigmund Daniel at the [facility] Pt is [id number] [place], [place] - [address]

## 2020-03-06 NOTE — Telephone Encounter (Signed)
LVM for the pt to call back, need to schedule pt TOC to another provider to address this refill.

## 2020-03-06 NOTE — Telephone Encounter (Signed)
Needs to establish with another provider

## 2020-03-07 ENCOUNTER — Other Ambulatory Visit: Payer: Self-pay | Admitting: Family Medicine

## 2020-03-07 DIAGNOSIS — K529 Noninfective gastroenteritis and colitis, unspecified: Secondary | ICD-10-CM

## 2020-03-07 MED ORDER — DICYCLOMINE HCL 20 MG PO TABS: ORAL_TABLET | ORAL | 1 refills | Status: DC

## 2020-03-07 NOTE — Telephone Encounter (Signed)
Dr. Zigmund Daniel please advise, please see note below.

## 2020-03-07 NOTE — Telephone Encounter (Signed)
Medication renewed.   Thanks!  CM

## 2020-03-27 ENCOUNTER — Encounter: Payer: Self-pay | Admitting: Family Medicine

## 2020-03-27 NOTE — Telephone Encounter (Signed)
Routing to provider  

## 2020-03-27 NOTE — Telephone Encounter (Signed)
Routing to provider. Msg sent to patient to clarify preferred pharmacy. Rx pended.

## 2020-03-28 ENCOUNTER — Encounter: Payer: Self-pay | Admitting: Family Medicine

## 2020-03-28 MED ORDER — CLONAZEPAM 1 MG PO TABS: 1.0000 mg | ORAL_TABLET | Freq: Two times a day (BID) | ORAL | 0 refills | Status: DC

## 2020-04-25 ENCOUNTER — Other Ambulatory Visit: Payer: Self-pay | Admitting: Family Medicine

## 2020-04-25 DIAGNOSIS — K529 Noninfective gastroenteritis and colitis, unspecified: Secondary | ICD-10-CM

## 2020-05-14 DIAGNOSIS — H02834 Dermatochalasis of left upper eyelid: Secondary | ICD-10-CM | POA: Diagnosis not present

## 2020-05-14 DIAGNOSIS — H43813 Vitreous degeneration, bilateral: Secondary | ICD-10-CM | POA: Diagnosis not present

## 2020-05-14 DIAGNOSIS — H0100B Unspecified blepharitis left eye, upper and lower eyelids: Secondary | ICD-10-CM | POA: Diagnosis not present

## 2020-05-14 DIAGNOSIS — H02831 Dermatochalasis of right upper eyelid: Secondary | ICD-10-CM | POA: Insufficient documentation

## 2020-05-14 DIAGNOSIS — H11153 Pinguecula, bilateral: Secondary | ICD-10-CM | POA: Diagnosis not present

## 2020-05-14 DIAGNOSIS — H01009 Unspecified blepharitis unspecified eye, unspecified eyelid: Secondary | ICD-10-CM | POA: Insufficient documentation

## 2020-05-14 DIAGNOSIS — H0100A Unspecified blepharitis right eye, upper and lower eyelids: Secondary | ICD-10-CM | POA: Diagnosis not present

## 2020-05-14 DIAGNOSIS — H0288B Meibomian gland dysfunction left eye, upper and lower eyelids: Secondary | ICD-10-CM | POA: Diagnosis not present

## 2020-05-14 DIAGNOSIS — H2513 Age-related nuclear cataract, bilateral: Secondary | ICD-10-CM | POA: Diagnosis not present

## 2020-05-14 DIAGNOSIS — H0288A Meibomian gland dysfunction right eye, upper and lower eyelids: Secondary | ICD-10-CM | POA: Diagnosis not present

## 2020-05-19 ENCOUNTER — Other Ambulatory Visit: Payer: Self-pay | Admitting: Family Medicine

## 2020-05-20 ENCOUNTER — Other Ambulatory Visit: Payer: Self-pay

## 2020-05-20 DIAGNOSIS — K529 Noninfective gastroenteritis and colitis, unspecified: Secondary | ICD-10-CM

## 2020-05-20 MED ORDER — DICYCLOMINE HCL 20 MG PO TABS: ORAL_TABLET | ORAL | 0 refills | Status: DC

## 2020-05-20 MED ORDER — LOVASTATIN 20 MG PO TABS: 20.0000 mg | ORAL_TABLET | Freq: Every day | ORAL | 4 refills | Status: DC

## 2020-05-20 MED ORDER — OMEPRAZOLE 40 MG PO CPDR: 40.0000 mg | DELAYED_RELEASE_CAPSULE | Freq: Every day | ORAL | 3 refills | Status: DC

## 2020-05-20 MED ORDER — TRIMETHOPRIM 100 MG PO TABS: 100.0000 mg | ORAL_TABLET | Freq: Every day | ORAL | 3 refills | Status: DC

## 2020-05-20 MED ORDER — BUPROPION HCL ER (XL) 150 MG PO TB24: 450.0000 mg | ORAL_TABLET | Freq: Every day | ORAL | 0 refills | Status: DC

## 2020-05-20 MED ORDER — CLONAZEPAM 1 MG PO TABS: 1.0000 mg | ORAL_TABLET | Freq: Two times a day (BID) | ORAL | 3 refills | Status: DC

## 2020-05-22 NOTE — Telephone Encounter (Signed)
CM-Plz see refill req/thx dmf 

## 2020-05-23 ENCOUNTER — Encounter: Payer: Self-pay | Admitting: Family Medicine

## 2020-05-23 ENCOUNTER — Ambulatory Visit (INDEPENDENT_AMBULATORY_CARE_PROVIDER_SITE_OTHER): Payer: Medicare Other | Admitting: Family Medicine

## 2020-05-23 ENCOUNTER — Ambulatory Visit: Payer: Medicare Other | Admitting: Family Medicine

## 2020-05-23 VITALS — BP 126/52 | HR 73 | Ht 66.93 in | Wt 174.7 lb

## 2020-05-23 DIAGNOSIS — B009 Herpesviral infection, unspecified: Secondary | ICD-10-CM | POA: Diagnosis not present

## 2020-05-23 DIAGNOSIS — F330 Major depressive disorder, recurrent, mild: Secondary | ICD-10-CM | POA: Diagnosis not present

## 2020-05-23 DIAGNOSIS — N3090 Cystitis, unspecified without hematuria: Secondary | ICD-10-CM | POA: Diagnosis not present

## 2020-05-23 DIAGNOSIS — E7800 Pure hypercholesterolemia, unspecified: Secondary | ICD-10-CM

## 2020-05-23 DIAGNOSIS — F339 Major depressive disorder, recurrent, unspecified: Secondary | ICD-10-CM | POA: Insufficient documentation

## 2020-05-23 NOTE — Assessment & Plan Note (Signed)
Recurrent episodes, well controlled with trimethoprim.  Followed by urology. Cindy [name]

## 2020-05-23 NOTE — Assessment & Plan Note (Signed)
Well controlled with combination of paxil and bupropion with clonazepam as needed.  Will continue medications at current dose.

## 2020-05-23 NOTE — Progress Notes (Signed)
Cindy [name] - 69 y.o. female MRN [id number]  Date of birth: September 19, 1951  Subjective Chief Complaint  Patient presents with  . Establish Care    HPI Cindy [name] is a 69 y.o. female with history of depression and anxiety, HLD, GERD, recurrent UTI's and GERD here today for follow up visit.    -Depression with anxiety:  Current management with paxil and bupropion daily with clonazepam as needed.  She is doing well with these medications without significant side effects.    -HLD:  Treated with lovastatin.  She is tolerating this well without side effects including myalgias and abdominal pain.   -Recurrent UTI:  Followed by urology.  Taking trimethoprim daily which has worked well for her.    -HSV:  Recurrent cold sores, well controlled with valtrex daily.   ROS:  A comprehensive ROS was completed and negative except as noted per HPI  No Known Allergies  Past Medical History:  Diagnosis Date  . Arthritis   . Cancer ([place])    breast  . Depression   . GERD (gastroesophageal reflux disease)   . Hyperlipidemia   . Tendonitis     Past Surgical History:  Procedure Laterality Date  . BREAST SURGERY     right  . ELBOW SURGERY     right    Social History   Socioeconomic History  . Marital status: Married    Spouse name: [name]  . Number of children: Not on file  . Years of education: Not on file  . Highest education level: Not on file  Occupational History  . Not on file  Tobacco Use  . Smoking status: Never Smoker  . Smokeless tobacco: Never Used  Vaping Use  . Vaping Use: Never used  Substance and Sexual Activity  . Alcohol use: Yes    Comment: 1-2 glasses of wine a month  . Drug use: No  . Sexual activity: Yes    Birth control/protection: Post-menopausal  Other Topics Concern  . Not on file  Social History Narrative  . Not on file   Social Determinants of Health   Financial Resource Strain:   . Difficulty of Paying Living Expenses:   Food Insecurity:    . Worried About Charity fundraiser in the Last Year:   . Arboriculturist in the Last Year:   Transportation Needs:   . Film/video editor (Medical):   Marland Kitchen Lack of Transportation (Non-Medical):   Physical Activity:   . Days of Exercise per Week:   . Minutes of Exercise per Session:   Stress:   . Feeling of Stress :   Social Connections:   . Frequency of Communication with Friends and Family:   . Frequency of Social Gatherings with Friends and Family:   . Attends Religious Services:   . Active Member of Clubs or Organizations:   . Attends Archivist Meetings:   Marland Kitchen Marital Status:     Family History  Problem Relation Age of Onset  . Cancer Mother        bone  . Hypertension Father   . Diabetes Father   . Stroke Father   . Hypertension Sister   . Alzheimer's disease Brother   . Hypertension Sister   . Colon cancer Neg Hx   . Esophageal cancer Neg Hx   . Rectal cancer Neg Hx   . Stomach cancer Neg Hx     Health Maintenance  Topic Date Due  . MAMMOGRAM  07/02/2020  . INFLUENZA VACCINE  07/14/2020  . TETANUS/TDAP  04/05/2028  . COLONOSCOPY  08/28/2029  . DEXA SCAN  Completed  . COVID-19 Vaccine  Completed  . Hepatitis C Screening  Completed  . PNA vac Low Risk Adult  Completed     ----------------------------------------------------------------------------------------------------------------------------------------------------------------------------------------------------------------- Physical Exam BP (!) 126/52 (BP Location: Left Arm, Patient Position: Sitting, Cuff Size: Normal)   Pulse 73   Ht 5' 6.93" (1.7 m)   Wt 174 lb 11.2 oz (79.2 kg)   SpO2 96%   BMI 27.42 kg/m   Physical Exam Constitutional:      Appearance: Normal appearance.  HENT:     Head: Normocephalic and atraumatic.  Eyes:     General: No scleral icterus. Cardiovascular:     Rate and Rhythm: Normal rate and regular rhythm.  Pulmonary:     Effort: Pulmonary effort is normal.      Breath sounds: Normal breath sounds.  Musculoskeletal:     Cervical back: Neck supple.  Skin:    General: Skin is warm and dry.  Neurological:     General: No focal deficit present.     Mental Status: She is alert.  Psychiatric:        Mood and Affect: Mood normal.        Behavior: Behavior normal.     ------------------------------------------------------------------------------------------------------------------------------------------------------------------------------------------------------------------- Assessment and Plan  Cystitis Recurrent episodes, well controlled with trimethoprim.  Followed by urology. .   MDD (major depressive disorder), recurrent episode ([place]) Well controlled with combination of paxil and bupropion with clonazepam as needed.  Will continue medications at current dose.   Hyperlipidemia Doing well with lovastatin.  Update lipid panel.   HSV-1 infection Controlled with daily valtrex, will continue.    No orders of the defined types were placed in this encounter.   Return in about 6 months (around 11/22/2020) for anxiety/HLD.    This visit occurred during the SARS-CoV-2 public health emergency.  Safety protocols were in place, including screening questions prior to the visit, additional usage of staff PPE, and extensive cleaning of exam room while observing appropriate contact time as indicated for disinfecting solutions.

## 2020-05-23 NOTE — Assessment & Plan Note (Signed)
Doing well with lovastatin.  Update lipid panel.

## 2020-05-23 NOTE — Assessment & Plan Note (Signed)
Controlled with daily valtrex, will continue.

## 2020-05-23 NOTE — Patient Instructions (Signed)
Great to see you today! Please have labs completed, we'll be in touch with results and recommendations.  Let's plan to follow up in about 6 months.

## 2020-05-24 NOTE — Telephone Encounter (Signed)
Patient scheduled.

## 2020-05-25 ENCOUNTER — Other Ambulatory Visit: Payer: Self-pay | Admitting: Family Medicine

## 2020-05-28 NOTE — Telephone Encounter (Signed)
CM-Plz see refill req/thx dmf 

## 2020-05-31 ENCOUNTER — Other Ambulatory Visit: Payer: Self-pay | Admitting: Family Medicine

## 2020-05-31 DIAGNOSIS — K529 Noninfective gastroenteritis and colitis, unspecified: Secondary | ICD-10-CM

## 2020-06-07 DIAGNOSIS — E7800 Pure hypercholesterolemia, unspecified: Secondary | ICD-10-CM | POA: Diagnosis not present

## 2020-06-07 LAB — COMPLETE METABOLIC PANEL WITH GFR
AG Ratio: 1.9 (calc) (ref 1.0–2.5)
ALT: 17 U/L (ref 6–29)
AST: 17 U/L (ref 10–35)
Albumin: 4.5 g/dL (ref 3.6–5.1)
Alkaline phosphatase (APISO): 109 U/L (ref 37–153)
BUN: 10 mg/dL (ref 7–25)
CO2: 29 mmol/L (ref 20–32)
Calcium: 9.5 mg/dL (ref 8.6–10.4)
Chloride: 102 mmol/L (ref 98–110)
Creat: 0.93 mg/dL (ref 0.50–0.99)
GFR, Est African American: 74 mL/min/{1.73_m2} (ref >=60)
GFR, Est Non African American: 64 mL/min/{1.73_m2} (ref >=60)
Globulin: 2.4 g/dL (calc) (ref 1.9–3.7)
Glucose, Bld: 94 mg/dL (ref 65–99)
Potassium: 4.7 mmol/L (ref 3.5–5.3)
Sodium: 140 mmol/L (ref 135–146)
Total Bilirubin: 0.5 mg/dL (ref 0.2–1.2)
Total Protein: 6.9 g/dL (ref 6.1–8.1)

## 2020-06-07 LAB — LIPID PANEL
Cholesterol: 215 mg/dL — ABNORMAL HIGH (ref <200)
HDL: 93 mg/dL (ref >=50)
LDL Cholesterol (Calc): 104 mg/dL (calc) — ABNORMAL HIGH
Non-HDL Cholesterol (Calc): 122 mg/dL (calc) (ref <130)
Total CHOL/HDL Ratio: 2.3 (calc) (ref <5.0)
Triglycerides: 86 mg/dL (ref <150)

## 2020-06-07 LAB — CBC
HCT: 41 % (ref 35.0–45.0)
Hemoglobin: 13.9 g/dL (ref 11.7–15.5)
MCH: 28.8 pg (ref 27.0–33.0)
MCHC: 33.9 g/dL (ref 32.0–36.0)
MCV: 85.1 fL (ref 80.0–100.0)
MPV: 12.4 fL (ref 7.5–12.5)
Platelets: 225 10*3/uL (ref 140–400)
RBC: 4.82 10*6/uL (ref 3.80–5.10)
RDW: 14.1 % (ref 11.0–15.0)
WBC: 5.1 10*3/uL (ref 3.8–10.8)

## 2020-07-08 DIAGNOSIS — Z1231 Encounter for screening mammogram for malignant neoplasm of breast: Secondary | ICD-10-CM | POA: Diagnosis not present

## 2020-07-08 LAB — HM MAMMOGRAPHY

## 2020-07-08 LAB — HM DEXA SCAN: HM Dexa Scan: NORMAL

## 2020-07-17 ENCOUNTER — Other Ambulatory Visit: Payer: Self-pay | Admitting: Family Medicine

## 2020-07-25 ENCOUNTER — Encounter: Payer: Self-pay | Admitting: Family Medicine

## 2020-07-30 ENCOUNTER — Encounter: Payer: Self-pay | Admitting: Family Medicine

## 2020-07-30 NOTE — Telephone Encounter (Signed)
She has an upcoming appointment. See message from sister.

## 2020-07-30 NOTE — Telephone Encounter (Signed)
I spoke with Mrs Cindy [name] and she is going to keep her care with [facility].

## 2020-08-02 ENCOUNTER — Encounter: Payer: Self-pay | Admitting: Family Medicine

## 2020-08-04 ENCOUNTER — Other Ambulatory Visit: Payer: Self-pay | Admitting: Family Medicine

## 2020-08-04 MED ORDER — VALACYCLOVIR HCL 500 MG PO TABS
500.0000 mg | ORAL_TABLET | Freq: Every day | ORAL | 1 refills | Status: DC
Start: 2020-08-04 — End: 2021-02-24

## 2020-08-04 MED ORDER — ACYCLOVIR 5 % EX CREA: 1.0000 "application " | TOPICAL_CREAM | CUTANEOUS | 0 refills | Status: DC

## 2020-08-04 MED ORDER — CIPROFLOXACIN HCL 500 MG PO TABS
500.0000 mg | ORAL_TABLET | Freq: Two times a day (BID) | ORAL | 0 refills | Status: DC
Start: 2020-08-04 — End: 2021-05-15

## 2020-08-14 ENCOUNTER — Other Ambulatory Visit: Payer: Self-pay

## 2020-08-14 MED ORDER — PAROXETINE HCL 20 MG PO TABS: 20.0000 mg | ORAL_TABLET | ORAL | 0 refills | Status: DC

## 2020-08-15 ENCOUNTER — Other Ambulatory Visit: Payer: Self-pay | Admitting: Family Medicine

## 2020-08-16 ENCOUNTER — Other Ambulatory Visit: Payer: Self-pay | Admitting: Family Medicine

## 2020-08-16 ENCOUNTER — Other Ambulatory Visit: Payer: Self-pay

## 2020-08-16 ENCOUNTER — Encounter: Payer: Self-pay | Admitting: Family Medicine

## 2020-08-16 MED ORDER — PAROXETINE HCL 20 MG PO TABS: 20.0000 mg | ORAL_TABLET | ORAL | 0 refills | Status: DC

## 2020-08-16 NOTE — Telephone Encounter (Signed)
Pt sent MyChart message requesting stop-gap medication due to slow [facility] Rx mail delivery.  [employer] representative also called with the same request due to their error.

## 2020-09-10 ENCOUNTER — Encounter: Payer: Self-pay | Admitting: Family Medicine

## 2020-09-10 ENCOUNTER — Telehealth (INDEPENDENT_AMBULATORY_CARE_PROVIDER_SITE_OTHER): Payer: Medicare Other | Admitting: Family Medicine

## 2020-09-10 DIAGNOSIS — J069 Acute upper respiratory infection, unspecified: Secondary | ICD-10-CM | POA: Diagnosis not present

## 2020-09-10 NOTE — Assessment & Plan Note (Signed)
Symptoms consistent with viral etiology. Nearly resolved today.  I think with mild symptoms and rapid resolution this would be atypical of COVID. She should continue supportive and symptomatic care at home.   I did recommend that she having testing if symptoms worsen again.

## 2020-09-10 NOTE — Progress Notes (Signed)
Cindy [name] - 69 y.o. female MRN [id number]  Date of birth: May 25, 1951   This visit type was conducted due to national recommendations for restrictions regarding the COVID-19 Pandemic (e.g. social distancing).  This format is felt to be most appropriate for this patient at this time.  All issues noted in this document were discussed and addressed.  No physical exam was performed (except for noted visual exam findings with Video Visits).  I discussed the limitations of evaluation and management by telemedicine and the availability of in person appointments. The patient expressed understanding and agreed to proceed.  I connected with@ on 09/10/20 at 11:30 AM EDT by a video enabled telemedicine application and verified that I am speaking with the correct person using two identifiers.  Present at visit: Luetta Nutting, DO [name]   Patient Location: Home [address] [place] [address]   Provider location:   [facility]  Chief Complaint  Patient presents with  . Cough    HPI  Cindy [name] is a 69 y.o. female who presents via audio/video conferencing for a telehealth visit today.  She reports developing cold symptoms including fatigue, sore throat, mild cough about 4 days ago.  She hydrated and rested over the weekend and reports that symptoms have improved significantly at this point.  She never had fever, chills, nausea, vomiting, diarrhea, or shortness of breath.  She has had full COVID vaccination   ROS:  A comprehensive ROS was completed and negative except as noted per HPI  Past Medical History:  Diagnosis Date  . Arthritis   . Cancer ([place])    breast  . Depression   . GERD (gastroesophageal reflux disease)   . Hyperlipidemia   . Tendonitis     Past Surgical History:  Procedure Laterality Date  . BREAST SURGERY     right  . ELBOW SURGERY     right    Family History  Problem Relation Age of Onset  . Cancer Mother        bone  . Hypertension Father   .  Diabetes Father   . Stroke Father   . Hypertension Sister   . Alzheimer's disease Brother   . Hypertension Sister   . Colon cancer Neg Hx   . Esophageal cancer Neg Hx   . Rectal cancer Neg Hx   . Stomach cancer Neg Hx     Social History   Socioeconomic History  . Marital status: Married    Spouse name: [name]  . Number of children: Not on file  . Years of education: Not on file  . Highest education level: Not on file  Occupational History  . Not on file  Tobacco Use  . Smoking status: Never Smoker  . Smokeless tobacco: Never Used  Vaping Use  . Vaping Use: Never used  Substance and Sexual Activity  . Alcohol use: Yes    Comment: 1-2 glasses of wine a month  . Drug use: No  . Sexual activity: Yes    Birth control/protection: Post-menopausal  Other Topics Concern  . Not on file  Social History Narrative  . Not on file   Social Determinants of Health   Financial Resource Strain:   . Difficulty of Paying Living Expenses: Not on file  Food Insecurity:   . Worried About Charity fundraiser in the Last Year: Not on file  . Ran Out of Food in the Last Year: Not on file  Transportation Needs:   .  Lack of Transportation (Medical): Not on file  . Lack of Transportation (Non-Medical): Not on file  Physical Activity:   . Days of Exercise per Week: Not on file  . Minutes of Exercise per Session: Not on file  Stress:   . Feeling of Stress : Not on file  Social Connections:   . Frequency of Communication with Friends and Family: Not on file  . Frequency of Social Gatherings with Friends and Family: Not on file  . Attends Religious Services: Not on file  . Active Member of Clubs or Organizations: Not on file  . Attends Archivist Meetings: Not on file  . Marital Status: Not on file  Intimate Partner Violence:   . Fear of Current or Ex-Partner: Not on file  . Emotionally Abused: Not on file  . Physically Abused: Not on file  . Sexually Abused: Not on file      Current Outpatient Medications:  .  acyclovir cream (ZOVIRAX) 5 %, Apply 1 application topically every 3 (three) hours., Disp: 15 g, Rfl: 0 .  buPROPion (WELLBUTRIN XL) 150 MG 24 hr tablet, TAKE 3 TABLETS EVERY DAY, Disp: 270 tablet, Rfl: 0 .  Calcium Carbonate-Vit D-Min (CALCIUM 1200 PO), Take 1 tablet by mouth daily. , Disp: , Rfl:  .  Cholecalciferol (VITAMIN D3 PO), Take by mouth., Disp: , Rfl:  .  ciprofloxacin (CIPRO) 500 MG tablet, Take 1 tablet (500 mg total) by mouth 2 (two) times daily. Start if developing UTI symptoms, Disp: 6 tablet, Rfl: 0 .  clonazePAM (KLONOPIN) 1 MG tablet, Take 1 tablet (1 mg total) by mouth 2 (two) times daily., Disp: 60 tablet, Rfl: 3 .  dicyclomine (BENTYL) 20 MG tablet, Take 1tid prn (sched with new provider for future fills), Disp: 30 tablet, Rfl: 0 .  docusate sodium (COLACE) 100 MG capsule, Take 100 mg by mouth at bedtime., Disp: , Rfl:  .  lovastatin (MEVACOR) 20 MG tablet, Take 1 tablet (20 mg total) by mouth at bedtime., Disp: 100 tablet, Rfl: 4 .  Multiple Vitamin (MULTIVITAMIN) tablet, Take 1 tablet by mouth daily.  , Disp: , Rfl:  .  omeprazole (PRILOSEC) 40 MG capsule, TAKE 1 CAPSULE EVERY DAY, Disp: 90 capsule, Rfl: 1 .  ondansetron (ZOFRAN ODT) 4 MG disintegrating tablet, Take 1 tablet (4 mg total) by mouth every 8 (eight) hours as needed., Disp: 20 tablet, Rfl: 0 .  polyethylene glycol (MIRALAX / GLYCOLAX) 17 g packet, Take 17 g by mouth daily as needed., Disp: , Rfl:  .  trimethoprim (TRIMPEX) 100 MG tablet, Take 1 tablet (100 mg total) by mouth daily., Disp: 90 tablet, Rfl: 3 .  valACYclovir (VALTREX) 500 MG tablet, Take 1 tablet (500 mg total) by mouth daily., Disp: 90 tablet, Rfl: 1 .  Zinc Sulfate (ZINC 15 PO), Take by mouth., Disp: , Rfl:  .  PARoxetine (PAXIL) 20 MG tablet, Take 1 tablet (20 mg total) by mouth every other day for 7 days., Disp: 7 tablet, Rfl: 0  EXAM:  VITALS per patient if applicable: Temp 92.4 F (36.6 C)   Wt  178 lb (80.7 kg)   BMI 27.94 kg/m   GENERAL: alert, oriented, appears well and in no acute distress  HEENT: atraumatic, conjunttiva clear, no obvious abnormalities on inspection of external nose and ears  NECK: normal movements of the head and neck  LUNGS: on inspection no signs of respiratory distress, breathing rate appears normal, no obvious gross SOB, gasping or wheezing  CV: no obvious cyanosis  MS: moves all visible extremities without noticeable abnormality  PSYCH/NEURO: pleasant and cooperative, no obvious depression or anxiety, speech and thought processing grossly intact  ASSESSMENT AND PLAN:  Discussed the following assessment and plan:  Viral URI Symptoms consistent with viral etiology. Nearly resolved today.  I think with mild symptoms and rapid resolution this would be atypical of COVID. She should continue supportive and symptomatic care at home.   I did recommend that she having testing if symptoms worsen again.       I discussed the assessment and treatment plan with the patient. The patient was provided an opportunity to ask questions and all were answered. The patient agreed with the plan and demonstrated an understanding of the instructions.   The patient was advised to call back or seek an in-person evaluation if the symptoms worsen or if the condition fails to improve as anticipated.    Luetta Nutting, DO

## 2020-09-10 NOTE — Progress Notes (Signed)
Symptoms started Friday.  Sore throat, cough, fatigue Not coughing as much Exudate on tonsils

## 2020-09-11 DIAGNOSIS — Z23 Encounter for immunization: Secondary | ICD-10-CM | POA: Diagnosis not present

## 2020-09-20 ENCOUNTER — Other Ambulatory Visit: Payer: Self-pay | Admitting: Family Medicine

## 2020-09-23 ENCOUNTER — Encounter: Payer: Self-pay | Admitting: Family Medicine

## 2020-09-25 ENCOUNTER — Other Ambulatory Visit: Payer: Self-pay | Admitting: Family Medicine

## 2020-09-25 DIAGNOSIS — K529 Noninfective gastroenteritis and colitis, unspecified: Secondary | ICD-10-CM

## 2020-09-27 ENCOUNTER — Other Ambulatory Visit: Payer: Self-pay

## 2020-09-27 ENCOUNTER — Encounter: Payer: Self-pay | Admitting: Family Medicine

## 2020-09-27 MED ORDER — TRIMETHOPRIM 100 MG PO TABS: 100.0000 mg | ORAL_TABLET | Freq: Every day | ORAL | 0 refills | Status: DC

## 2020-09-27 MED ORDER — PAROXETINE HCL 20 MG PO TABS
20.0000 mg | ORAL_TABLET | ORAL | 0 refills | Status: DC
Start: 2020-09-27 — End: 2020-10-01

## 2020-09-30 ENCOUNTER — Encounter: Payer: Self-pay | Admitting: Family Medicine

## 2020-10-01 ENCOUNTER — Telehealth: Payer: Self-pay

## 2020-10-01 ENCOUNTER — Other Ambulatory Visit: Payer: Self-pay | Admitting: Family Medicine

## 2020-10-01 DIAGNOSIS — K529 Noninfective gastroenteritis and colitis, unspecified: Secondary | ICD-10-CM

## 2020-10-01 MED ORDER — DICYCLOMINE HCL 20 MG PO TABS: ORAL_TABLET | ORAL | 3 refills | Status: DC

## 2020-10-07 ENCOUNTER — Encounter: Payer: Self-pay | Admitting: Family Medicine

## 2020-10-26 DIAGNOSIS — Z23 Encounter for immunization: Secondary | ICD-10-CM | POA: Diagnosis not present

## 2020-11-22 ENCOUNTER — Other Ambulatory Visit: Payer: Self-pay

## 2020-11-22 ENCOUNTER — Ambulatory Visit (INDEPENDENT_AMBULATORY_CARE_PROVIDER_SITE_OTHER): Payer: Medicare Other | Admitting: Family Medicine

## 2020-11-22 ENCOUNTER — Encounter: Payer: Self-pay | Admitting: Family Medicine

## 2020-11-22 VITALS — BP 141/80 | HR 70 | Wt 166.9 lb

## 2020-11-22 DIAGNOSIS — B009 Herpesviral infection, unspecified: Secondary | ICD-10-CM | POA: Diagnosis not present

## 2020-11-22 DIAGNOSIS — E7800 Pure hypercholesterolemia, unspecified: Secondary | ICD-10-CM

## 2020-11-22 DIAGNOSIS — F418 Other specified anxiety disorders: Secondary | ICD-10-CM

## 2020-11-22 DIAGNOSIS — N3090 Cystitis, unspecified without hematuria: Secondary | ICD-10-CM | POA: Diagnosis not present

## 2020-11-22 DIAGNOSIS — K582 Mixed irritable bowel syndrome: Secondary | ICD-10-CM | POA: Diagnosis not present

## 2020-11-22 LAB — COMPLETE METABOLIC PANEL WITH GFR
AG Ratio: 1.9 (calc) (ref 1.0–2.5)
ALT: 18 U/L (ref 6–29)
AST: 21 U/L (ref 10–35)
Albumin: 4.7 g/dL (ref 3.6–5.1)
Alkaline phosphatase (APISO): 105 U/L (ref 37–153)
BUN: 8 mg/dL (ref 7–25)
CO2: 29 mmol/L (ref 20–32)
Calcium: 9.7 mg/dL (ref 8.6–10.4)
Chloride: 102 mmol/L (ref 98–110)
Creat: 0.91 mg/dL (ref 0.50–0.99)
GFR, Est African American: 76 mL/min/{1.73_m2} (ref >=60)
GFR, Est Non African American: 65 mL/min/{1.73_m2} (ref >=60)
Globulin: 2.5 g/dL (calc) (ref 1.9–3.7)
Glucose, Bld: 91 mg/dL (ref 65–139)
Potassium: 4.4 mmol/L (ref 3.5–5.3)
Sodium: 139 mmol/L (ref 135–146)
Total Bilirubin: 0.7 mg/dL (ref 0.2–1.2)
Total Protein: 7.2 g/dL (ref 6.1–8.1)

## 2020-11-22 LAB — CBC
HCT: 41.3 % (ref 35.0–45.0)
Hemoglobin: 13.7 g/dL (ref 11.7–15.5)
MCH: 28.9 pg (ref 27.0–33.0)
MCHC: 33.2 g/dL (ref 32.0–36.0)
MCV: 87.1 fL (ref 80.0–100.0)
MPV: 12.2 fL (ref 7.5–12.5)
Platelets: 233 10*3/uL (ref 140–400)
RBC: 4.74 10*6/uL (ref 3.80–5.10)
RDW: 13.2 % (ref 11.0–15.0)
WBC: 5.8 10*3/uL (ref 3.8–10.8)

## 2020-11-22 LAB — LIPID PANEL
Cholesterol: 231 mg/dL — ABNORMAL HIGH (ref <200)
HDL: 97 mg/dL (ref >=50)
LDL Cholesterol (Calc): 115 mg/dL (calc) — ABNORMAL HIGH
Non-HDL Cholesterol (Calc): 134 mg/dL (calc) — ABNORMAL HIGH (ref <130)
Total CHOL/HDL Ratio: 2.4 (calc) (ref <5.0)
Triglycerides: 89 mg/dL (ref <150)

## 2020-11-22 MED ORDER — PAROXETINE HCL 20 MG PO TABS
40.0000 mg | ORAL_TABLET | Freq: Every day | ORAL | 2 refills | Status: DC
Start: 2020-11-22 — End: 2020-11-22

## 2020-11-22 MED ORDER — CLONAZEPAM 1 MG PO TABS
1.0000 mg | ORAL_TABLET | Freq: Two times a day (BID) | ORAL | 3 refills | Status: DC
Start: 2020-11-22 — End: 2021-04-04

## 2020-11-22 MED ORDER — PAROXETINE HCL 20 MG PO TABS
40.0000 mg | ORAL_TABLET | Freq: Every day | ORAL | 0 refills | Status: DC
Start: 2020-11-22 — End: 2020-12-10

## 2020-11-22 MED ORDER — BUPROPION HCL ER (XL) 150 MG PO TB24
ORAL_TABLET | ORAL | 2 refills | Status: DC
Start: 2020-11-22 — End: 2021-05-23

## 2020-11-22 NOTE — Patient Instructions (Signed)
Great to see you today! Please continue current medications.  Have labs updated.  See me in 6 months I hope you have a Merry Christmas!

## 2020-11-24 DIAGNOSIS — K589 Irritable bowel syndrome without diarrhea: Secondary | ICD-10-CM | POA: Insufficient documentation

## 2020-11-24 NOTE — Assessment & Plan Note (Signed)
Valtrex as needed 

## 2020-11-24 NOTE — Assessment & Plan Note (Signed)
Stable with current medications

## 2020-11-24 NOTE — Progress Notes (Signed)
Cindy [name] - 69 y.o. female MRN [id number]  Date of birth: December 30, 1950  Subjective No chief complaint on file.   HPI Cindy [name] is a 69 y.o. female here today for follow-up visit.  She has history of hyperlipidemia, depression with anxiety, breast cancer, IBS and GERD.  Sharilynn reports increased anxiety recently due to recent health issues with her older sister as well as a young niece who is recently diagnosed with malignant melanoma.  She does feel that Paxil and bupropion continue to work well for her.  She continues to use clonazepam nightly to help with sleep.  This continues to work well for her.  She has not noticed any side effects related to medications.  She does have good support from family and friends.  Hyperlipidemia is currently managed with lovastatin.  She is tolerating this well without any significant side effects.  She denies myalgias.  She is history of IBS, uses MiraLAX and Colace as needed as well as Bentyl as needed.  These continue to work well for her.  Symptoms are not significantly worsened due to her increased stress at this time.  She does continue on trimethoprim for management of recurrent UTI.  This is managed by urology.  ROS:  A comprehensive ROS was completed and negative except as noted per HPI      No Known Allergies  Past Medical History:  Diagnosis Date  . Arthritis   . Cancer ([place])    breast  . Depression   . GERD (gastroesophageal reflux disease)   . Hyperlipidemia   . Tendonitis     Past Surgical History:  Procedure Laterality Date  . BREAST SURGERY     right  . ELBOW SURGERY     right    Social History   Socioeconomic History  . Marital status: Married    Spouse name: [name]  . Number of children: Not on file  . Years of education: Not on file  . Highest education level: Not on file  Occupational History  . Not on file  Tobacco Use  . Smoking status: Never Smoker  . Smokeless tobacco: Never Used  Vaping Use  .  Vaping Use: Never used  Substance and Sexual Activity  . Alcohol use: Yes    Comment: 1-2 glasses of wine a month  . Drug use: No  . Sexual activity: Yes    Birth control/protection: Post-menopausal  Other Topics Concern  . Not on file  Social History Narrative  . Not on file   Social Determinants of Health   Financial Resource Strain: Not on file  Food Insecurity: Not on file  Transportation Needs: Not on file  Physical Activity: Not on file  Stress: Not on file  Social Connections: Not on file    Family History  Problem Relation Age of Onset  . Cancer Mother        bone  . Hypertension Father   . Diabetes Father   . Stroke Father   . Hypertension Sister   . Alzheimer's disease Brother   . Hypertension Sister   . Colon cancer Neg Hx   . Esophageal cancer Neg Hx   . Rectal cancer Neg Hx   . Stomach cancer Neg Hx     Health Maintenance  Topic Date Due  . COVID-19 Vaccine (3 - Booster for Moderna series) 04/29/2021  . MAMMOGRAM  07/08/2021  . TETANUS/TDAP  04/05/2028  . COLONOSCOPY  08/28/2029  . INFLUENZA VACCINE  Completed  .  DEXA SCAN  Completed  . Hepatitis C Screening  Completed  . PNA vac Low Risk Adult  Completed     ----------------------------------------------------------------------------------------------------------------------------------------------------------------------------------------------------------------- Physical Exam BP (!) 141/80 (BP Location: Left Arm, Patient Position: Sitting, Cuff Size: Normal)   Pulse 70   Wt 166 lb 14.4 oz (75.7 kg)   SpO2 100%   BMI 26.20 kg/m   Physical Exam Constitutional:      Appearance: Normal appearance.  HENT:     Head: Normocephalic and atraumatic.  Eyes:     General: No scleral icterus. Cardiovascular:     Rate and Rhythm: Normal rate and regular rhythm.  Pulmonary:     Effort: Pulmonary effort is normal.     Breath sounds: Normal breath sounds.  Skin:    General: Skin is warm and dry.   Neurological:     General: No focal deficit present.     Mental Status: She is alert.  Psychiatric:        Mood and Affect: Mood normal.        Behavior: Behavior normal.     ------------------------------------------------------------------------------------------------------------------------------------------------------------------------------------------------------------------- Assessment and Plan  Depression with anxiety Some increased anxiety recently due to health problems within her family.  Current medications continue to work well for her so we will continue these.  She does not feel that she needs referral for counseling at this time and continues to have good support from family.  Hyperlipidemia She is tolerating lovastatin well.  We will update lipid panel.  Cystitis She continues on trimethoprim for history of recurrent UTI.  HSV-1 infection Valtrex as needed.  IBS (irritable bowel syndrome) Stable with current medications.   Meds ordered this encounter  Medications  . buPROPion (WELLBUTRIN XL) 150 MG 24 hr tablet    Sig: TAKE 3 TABLETS EVERY DAY    Dispense:  270 tablet    Refill:  2  . clonazePAM (KLONOPIN) 1 MG tablet    Sig: Take 1 tablet (1 mg total) by mouth 2 (two) times daily.    Dispense:  60 tablet    Refill:  3  . DISCONTD: PARoxetine (PAXIL) 20 MG tablet    Sig: Take 2 tablets (40 mg total) by mouth daily.    Dispense:  180 tablet    Refill:  2  . PARoxetine (PAXIL) 20 MG tablet    Sig: Take 2 tablets (40 mg total) by mouth daily.    Dispense:  10 tablet    Refill:  0   Orders Placed This Encounter  Procedures  . COMPLETE METABOLIC PANEL WITH GFR  . CBC  . Lipid Profile    Return in about 6 months (around 05/23/2021) for HLD/Anxiety.    This visit occurred during the SARS-CoV-2 public health emergency.  Safety protocols were in place, including screening questions prior to the visit, additional usage of staff PPE, and extensive  cleaning of exam room while observing appropriate contact time as indicated for disinfecting solutions.

## 2020-11-24 NOTE — Assessment & Plan Note (Signed)
Some increased anxiety recently due to health problems within her family.  Current medications continue to work well for her so we will continue these.  She does not feel that she needs referral for counseling at this time and continues to have good support from family.

## 2020-11-24 NOTE — Assessment & Plan Note (Signed)
She is tolerating lovastatin well.  We will update lipid panel.

## 2020-11-24 NOTE — Assessment & Plan Note (Signed)
She continues on trimethoprim for history of recurrent UTI.

## 2020-12-05 ENCOUNTER — Encounter: Payer: Self-pay | Admitting: Family Medicine

## 2020-12-05 ENCOUNTER — Telehealth: Payer: Self-pay | Admitting: Neurology

## 2020-12-05 NOTE — Telephone Encounter (Signed)
PA approved via [employer] valid until 12/13/2021.

## 2020-12-05 NOTE — Telephone Encounter (Signed)
Prior Authorization for Paroxetine submitted via covermymeds. Awaiting response.

## 2020-12-10 ENCOUNTER — Other Ambulatory Visit: Payer: Self-pay

## 2020-12-10 ENCOUNTER — Other Ambulatory Visit: Payer: Self-pay | Admitting: Family Medicine

## 2020-12-10 MED ORDER — PAROXETINE HCL 20 MG PO TABS: 40.0000 mg | ORAL_TABLET | Freq: Every day | ORAL | 2 refills | Status: DC

## 2020-12-18 ENCOUNTER — Other Ambulatory Visit: Payer: Self-pay

## 2020-12-18 ENCOUNTER — Other Ambulatory Visit: Payer: Self-pay | Admitting: Family Medicine

## 2020-12-18 MED ORDER — PAROXETINE HCL 20 MG PO TABS: 40.0000 mg | ORAL_TABLET | Freq: Every day | ORAL | 5 refills | Status: DC

## 2020-12-18 MED ORDER — PAROXETINE HCL 20 MG PO TABS: 40.0000 mg | ORAL_TABLET | Freq: Every day | ORAL | 3 refills | Status: DC

## 2021-01-06 ENCOUNTER — Other Ambulatory Visit: Payer: Self-pay | Admitting: Family Medicine

## 2021-01-06 DIAGNOSIS — K529 Noninfective gastroenteritis and colitis, unspecified: Secondary | ICD-10-CM

## 2021-01-16 ENCOUNTER — Encounter: Payer: Self-pay | Admitting: Family Medicine

## 2021-01-17 ENCOUNTER — Encounter: Payer: Self-pay | Admitting: Family Medicine

## 2021-01-17 ENCOUNTER — Encounter: Payer: Medicare Other | Admitting: Family Medicine

## 2021-01-17 DIAGNOSIS — H1030 Unspecified acute conjunctivitis, unspecified eye: Secondary | ICD-10-CM | POA: Diagnosis not present

## 2021-01-17 MED ORDER — POLYMYXIN B-TRIMETHOPRIM 10000-0.1 UNIT/ML-% OP SOLN: OPHTHALMIC | 0 refills | Status: DC

## 2021-01-17 NOTE — Addendum Note (Signed)
Addended by: Perlie Mayo on: 01/17/2021 11:27 AM   Modules accepted: Orders

## 2021-01-17 NOTE — Telephone Encounter (Signed)
Already been handled.  

## 2021-01-17 NOTE — Telephone Encounter (Signed)
Patient contacted office via MyChart >7 days from their last visit, about an issue of possible conjunctivitis, unrelated to that visit. They did this independently (not in response to inquiry from this office). 5 minutes spent by myself and our staff in addressing this issue. Billed appropriately for addressing diagnosis/diagnoses below:  The encounter diagnosis was Acute conjunctivitis, unspecified acute conjunctivitis type, unspecified laterality.  Meds ordered this encounter  Medications  . trimethoprim-polymyxin b (POLYTRIM) ophthalmic solution    Sig: 1 drop every 4 hours to affected eye.    Dispense:  10 mL    Refill:  0    No orders of the defined types were placed in this encounter.   See MyChart message

## 2021-01-24 ENCOUNTER — Other Ambulatory Visit: Payer: Self-pay | Admitting: Family Medicine

## 2021-01-24 DIAGNOSIS — K529 Noninfective gastroenteritis and colitis, unspecified: Secondary | ICD-10-CM

## 2021-02-24 ENCOUNTER — Other Ambulatory Visit: Payer: Self-pay

## 2021-02-24 MED ORDER — VALACYCLOVIR HCL 500 MG PO TABS: 500.0000 mg | ORAL_TABLET | Freq: Every day | ORAL | 1 refills | Status: DC

## 2021-02-25 DIAGNOSIS — R351 Nocturia: Secondary | ICD-10-CM | POA: Diagnosis not present

## 2021-04-03 ENCOUNTER — Other Ambulatory Visit: Payer: Self-pay | Admitting: Family Medicine

## 2021-05-02 ENCOUNTER — Other Ambulatory Visit: Payer: Self-pay | Admitting: Family Medicine

## 2021-05-06 ENCOUNTER — Encounter: Payer: Self-pay | Admitting: Family Medicine

## 2021-05-06 ENCOUNTER — Other Ambulatory Visit: Payer: Self-pay | Admitting: Family Medicine

## 2021-05-06 DIAGNOSIS — K529 Noninfective gastroenteritis and colitis, unspecified: Secondary | ICD-10-CM

## 2021-05-15 ENCOUNTER — Encounter: Payer: Self-pay | Admitting: Family Medicine

## 2021-05-15 ENCOUNTER — Ambulatory Visit (INDEPENDENT_AMBULATORY_CARE_PROVIDER_SITE_OTHER): Payer: Medicare Other | Admitting: Family Medicine

## 2021-05-15 ENCOUNTER — Other Ambulatory Visit: Payer: Self-pay

## 2021-05-15 DIAGNOSIS — L02214 Cutaneous abscess of groin: Secondary | ICD-10-CM | POA: Insufficient documentation

## 2021-05-15 MED ORDER — DOXYCYCLINE HYCLATE 100 MG PO TABS: 100.0000 mg | ORAL_TABLET | Freq: Two times a day (BID) | ORAL | 0 refills | Status: AC

## 2021-05-15 NOTE — Assessment & Plan Note (Signed)
Seems to be improving after spontaneous drainage.  Still with some induration and erythema however no pockets of fluctuance noted.   Adding doxycycline 100mg  BID.  She will let me know if this is not resolving with this.

## 2021-05-15 NOTE — Progress Notes (Signed)
Cindy [name] - 70 y.o. female MRN [id number]  Date of birth: 1951/08/24  Subjective Chief Complaint  Patient presents with  . Cyst    HPI Cindy [name] is a 70 y.o. female here today with complaint of swollen area in the R groin area.  This first appeared a few days ago.  Started as small tender area which became larger over a the next couple of days.  She applied warm compresses and eventually the area began draining spontaneously.  It is much smaller but still swollen and tender.  She denies fever or chills.    ROS:  A comprehensive ROS was completed and negative except as noted per HPI  No Known Allergies  Past Medical History:  Diagnosis Date  . Arthritis   . Cancer ([place])    breast  . Depression   . GERD (gastroesophageal reflux disease)   . Hyperlipidemia   . Tendonitis     Past Surgical History:  Procedure Laterality Date  . BREAST SURGERY     right  . ELBOW SURGERY     right    Social History   Socioeconomic History  . Marital status: Married    Spouse name: [name]  . Number of children: Not on file  . Years of education: Not on file  . Highest education level: Not on file  Occupational History  . Not on file  Tobacco Use  . Smoking status: Never Smoker  . Smokeless tobacco: Never Used  Vaping Use  . Vaping Use: Never used  Substance and Sexual Activity  . Alcohol use: Yes    Comment: 1-2 glasses of wine a month  . Drug use: No  . Sexual activity: Yes    Birth control/protection: Post-menopausal  Other Topics Concern  . Not on file  Social History Narrative  . Not on file   Social Determinants of Health   Financial Resource Strain: Not on file  Food Insecurity: Not on file  Transportation Needs: Not on file  Physical Activity: Not on file  Stress: Not on file  Social Connections: Not on file    Family History  Problem Relation Age of Onset  . Cancer Mother        bone  . Hypertension Father   . Diabetes Father   . Stroke Father   .  Hypertension Sister   . Alzheimer's disease Brother   . Hypertension Sister   . Colon cancer Neg Hx   . Esophageal cancer Neg Hx   . Rectal cancer Neg Hx   . Stomach cancer Neg Hx     Health Maintenance  Topic Date Due  . MAMMOGRAM  07/08/2021  . INFLUENZA VACCINE  07/14/2021  . TETANUS/TDAP  04/05/2028  . COLONOSCOPY (Pts 45-81yrs Insurance coverage will need to be confirmed)  08/28/2029  . DEXA SCAN  Completed  . COVID-19 Vaccine  Completed  . Hepatitis C Screening  Completed  . PNA vac Low Risk Adult  Completed  . Zoster Vaccines- Shingrix  Completed  . Pneumococcal Vaccine 52-27 Years old  Aged Out  . HPV VACCINES  Aged Out     ----------------------------------------------------------------------------------------------------------------------------------------------------------------------------------------------------------------- Physical Exam BP (!) 122/58 (BP Location: Left Arm, Patient Position: Sitting, Cuff Size: Large)   Pulse 73   Temp (!) 97.5 F (36.4 C)   Wt 168 lb 12.8 oz (76.6 kg)   SpO2 99%   BMI 26.49 kg/m   Physical Exam Constitutional:      Appearance: Normal appearance.  Skin:  Comments: Helyn Numbers, CMA present for exam.   Small abscess in R groin area.  Mild ttp.  No fluctuance present but continued induration and some surrounding erythema  Neurological:     Mental Status: She is alert.     ------------------------------------------------------------------------------------------------------------------------------------------------------------------------------------------------------------------- Assessment and Plan  Abscess of groin, right Seems to be improving after spontaneous drainage.  Still with some induration and erythema however no pockets of fluctuance noted.   Adding doxycycline 100mg  BID.  She will let me know if this is not resolving with this.    Meds ordered this encounter  Medications  . doxycycline  (VIBRA-TABS) 100 MG tablet    Sig: Take 1 tablet (100 mg total) by mouth 2 (two) times daily for 10 days.    Dispense:  20 tablet    Refill:  0    No follow-ups on file.    This visit occurred during the SARS-CoV-2 public health emergency.  Safety protocols were in place, including screening questions prior to the visit, additional usage of staff PPE, and extensive cleaning of exam room while observing appropriate contact time as indicated for disinfecting solutions.

## 2021-05-15 NOTE — Patient Instructions (Signed)
Skin Abscess  A skin abscess is an infected area on or under your skin that contains a collection of pus and other material. An abscess may also be called a furuncle, carbuncle, or boil. An abscess can occur in or on almost any part of your body. Some abscesses break open (rupture) on their own. Most continue to get worse unless they are treated. The infection can spread deeper into the body and eventually into your blood, which can make you feel ill. Treatment usually involves draining the abscess. What are the causes? An abscess occurs when germs, like bacteria, pass through your skin and cause an infection. This may be caused by:  A scrape or cut on your skin.  A puncture wound through your skin, including a needle injection or insect bite.  Blocked oil or sweat glands.  Blocked and infected hair follicles.  A cyst that forms beneath your skin (sebaceous cyst) and becomes infected. What increases the risk? This condition is more likely to develop in people who:  Have a weak body defense system (immune system).  Have diabetes.  Have dry and irritated skin.  Get frequent injections or use illegal IV drugs.  Have a foreign body in a wound, such as a splinter.  Have problems with their lymph system or veins. What are the signs or symptoms? Symptoms of this condition include:  A painful, firm bump under the skin.  A bump with pus at the top. This may break through the skin and drain. Other symptoms include:  Redness surrounding the abscess site.  Warmth.  Swelling of the lymph nodes (glands) near the abscess.  Tenderness.  A sore on the skin. How is this diagnosed? This condition may be diagnosed based on:  A physical exam.  Your medical history.  A sample of pus. This may be used to find out what is causing the infection.  Blood tests.  Imaging tests, such as an ultrasound, CT scan, or MRI. How is this treated? A small abscess that drains on its own may not  need treatment. Treatment for larger abscesses may include:  Moist heat or heat pack applied to the area several times a day.  A procedure to drain the abscess (incision and drainage).  Antibiotic medicines. For a severe abscess, you may first get antibiotics through an IV and then change to antibiotics by mouth. Follow these instructions at home: Medicines  Take over-the-counter and prescription medicines only as told by your health care provider.  If you were prescribed an antibiotic medicine, take it as told by your health care provider. Do not stop taking the antibiotic even if you start to feel better.   Abscess care  If you have an abscess that has not drained, apply heat to the affected area. Use the heat source that your health care provider recommends, such as a moist heat pack or a heating pad. ? Place a towel between your skin and the heat source. ? Leave the heat on for 20-30 minutes. ? Remove the heat if your skin turns bright red. This is especially important if you are unable to feel pain, heat, or cold. You may have a greater risk of getting burned.  Follow instructions from your health care provider about how to take care of your abscess. Make sure you: ? Cover the abscess with a bandage (dressing). ? Change your dressing or gauze as told by your health care provider. ? Wash your hands with soap and water before you change the  dressing or gauze. If soap and water are not available, use hand sanitizer.  Check your abscess every day for signs of a worsening infection. Check for: ? More redness, swelling, or pain. ? More fluid or blood. ? Warmth. ? More pus or a bad smell.   General instructions  To avoid spreading the infection: ? Do not share personal care items, towels, or hot tubs with others. ? Avoid making skin contact with other people.  Keep all follow-up visits as told by your health care provider. This is important. Contact a health care provider if you  have:  More redness, swelling, or pain around your abscess.  More fluid or blood coming from your abscess.  Warm skin around your abscess.  More pus or a bad smell coming from your abscess.  A fever.  Muscle aches.  Chills or a general ill feeling. Get help right away if you:  Have severe pain.  See red streaks on your skin spreading away from the abscess. Summary  A skin abscess is an infected area on or under your skin that contains a collection of pus and other material.  A small abscess that drains on its own may not need treatment.  Treatment for larger abscesses may include having a procedure to drain the abscess and taking an antibiotic. This information is not intended to replace advice given to you by your health care provider. Make sure you discuss any questions you have with your health care provider. Document Revised: 03/23/2019 Document Reviewed: 01/13/2018 Elsevier Patient Education  2021 [employer].

## 2021-05-19 DIAGNOSIS — H52203 Unspecified astigmatism, bilateral: Secondary | ICD-10-CM | POA: Diagnosis not present

## 2021-05-19 DIAGNOSIS — H0288B Meibomian gland dysfunction left eye, upper and lower eyelids: Secondary | ICD-10-CM | POA: Diagnosis not present

## 2021-05-19 DIAGNOSIS — H43813 Vitreous degeneration, bilateral: Secondary | ICD-10-CM | POA: Diagnosis not present

## 2021-05-19 DIAGNOSIS — H0100B Unspecified blepharitis left eye, upper and lower eyelids: Secondary | ICD-10-CM | POA: Diagnosis not present

## 2021-05-19 DIAGNOSIS — H02831 Dermatochalasis of right upper eyelid: Secondary | ICD-10-CM | POA: Diagnosis not present

## 2021-05-19 DIAGNOSIS — H2513 Age-related nuclear cataract, bilateral: Secondary | ICD-10-CM | POA: Diagnosis not present

## 2021-05-19 DIAGNOSIS — H0288A Meibomian gland dysfunction right eye, upper and lower eyelids: Secondary | ICD-10-CM | POA: Diagnosis not present

## 2021-05-19 DIAGNOSIS — H02834 Dermatochalasis of left upper eyelid: Secondary | ICD-10-CM | POA: Diagnosis not present

## 2021-05-19 DIAGNOSIS — H11153 Pinguecula, bilateral: Secondary | ICD-10-CM | POA: Diagnosis not present

## 2021-05-19 DIAGNOSIS — H5203 Hypermetropia, bilateral: Secondary | ICD-10-CM | POA: Diagnosis not present

## 2021-05-19 DIAGNOSIS — H524 Presbyopia: Secondary | ICD-10-CM | POA: Diagnosis not present

## 2021-05-19 DIAGNOSIS — H0100A Unspecified blepharitis right eye, upper and lower eyelids: Secondary | ICD-10-CM | POA: Diagnosis not present

## 2021-05-23 ENCOUNTER — Ambulatory Visit (INDEPENDENT_AMBULATORY_CARE_PROVIDER_SITE_OTHER): Payer: Medicare Other | Admitting: Family Medicine

## 2021-05-23 ENCOUNTER — Encounter: Payer: Self-pay | Admitting: Family Medicine

## 2021-05-23 ENCOUNTER — Other Ambulatory Visit: Payer: Self-pay

## 2021-05-23 DIAGNOSIS — L02214 Cutaneous abscess of groin: Secondary | ICD-10-CM

## 2021-05-23 DIAGNOSIS — E7800 Pure hypercholesterolemia, unspecified: Secondary | ICD-10-CM | POA: Diagnosis not present

## 2021-05-23 DIAGNOSIS — F418 Other specified anxiety disorders: Secondary | ICD-10-CM | POA: Diagnosis not present

## 2021-05-23 MED ORDER — BUPROPION HCL ER (XL) 150 MG PO TB24: ORAL_TABLET | ORAL | 2 refills | Status: DC

## 2021-05-23 MED ORDER — LOVASTATIN 20 MG PO TABS: 20.0000 mg | ORAL_TABLET | Freq: Every day | ORAL | 4 refills | Status: DC

## 2021-05-23 NOTE — Patient Instructions (Signed)
Great to see you today! Continue current medications.  See me again in 6 months.  

## 2021-05-25 NOTE — Assessment & Plan Note (Signed)
Lovastatin renewed. Lab Results  Component Value Date   LDLCALC 115 (H) 11/22/2020

## 2021-05-25 NOTE — Assessment & Plan Note (Signed)
Nearly resolved

## 2021-05-25 NOTE — Assessment & Plan Note (Signed)
Stable at this time.  She will continue current medications of paxil and bupropion with clonazepam as needed.

## 2021-05-25 NOTE — Progress Notes (Signed)
Cindy [name] - 70 y.o. female MRN [id number]  Date of birth: Feb 27, 1951  Subjective Chief Complaint  Patient presents with   Anxiety    HPI Cindy [name] is a 70 y.o. female here today for follow up of anxiety and HLD.  She reports that she has been under a little more stress recently however she seems to be handling this ok.  Her niece who has melanoma was recently moved to Hospice.  She is helping to care for her children.  She feels that current medications of paxil and bupropion are helping.  She does use clonazepam occasionally. She denies side effects related to medication.    She continues to do well with lovastatin for HLD.  Denies myalgias.    Abscess in her groin has nearly resolved.   ROS:  A comprehensive ROS was completed and negative except as noted per HPI  No Known Allergies  Past Medical History:  Diagnosis Date   Arthritis    Cancer ([place])    breast   Depression    GERD (gastroesophageal reflux disease)    Hyperlipidemia    Tendonitis     Past Surgical History:  Procedure Laterality Date   BREAST SURGERY     right   ELBOW SURGERY     right    Social History   Socioeconomic History   Marital status: Married    Spouse name: [name]   Number of children: Not on file   Years of education: Not on file   Highest education level: Not on file  Occupational History   Not on file  Tobacco Use   Smoking status: Never   Smokeless tobacco: Never  Vaping Use   Vaping Use: Never used  Substance and Sexual Activity   Alcohol use: Yes    Comment: 1-2 glasses of wine a month   Drug use: No   Sexual activity: Yes    Birth control/protection: Post-menopausal  Other Topics Concern   Not on file  Social History Narrative   Not on file   Social Determinants of Health   Financial Resource Strain: Not on file  Food Insecurity: Not on file  Transportation Needs: Not on file  Physical Activity: Not on file  Stress: Not on file  Social Connections: Not on  file    Family History  Problem Relation Age of Onset   Cancer Mother        bone   Hypertension Father    Diabetes Father    Stroke Father    Hypertension Sister    Alzheimer's disease Brother    Hypertension Sister    Colon cancer Neg Hx    Esophageal cancer Neg Hx    Rectal cancer Neg Hx    Stomach cancer Neg Hx     Health Maintenance  Topic Date Due   COVID-19 Vaccine (4 - Booster) 02/27/2021   MAMMOGRAM  07/08/2021   INFLUENZA VACCINE  07/14/2021   TETANUS/TDAP  04/05/2028   COLONOSCOPY (Pts 45-68yrs Insurance coverage will need to be confirmed)  08/28/2029   DEXA SCAN  Completed   Hepatitis C Screening  Completed   PNA vac Low Risk Adult  Completed   Zoster Vaccines- Shingrix  Completed   HPV VACCINES  Aged Out     ----------------------------------------------------------------------------------------------------------------------------------------------------------------------------------------------------------------- Physical Exam BP 117/69 (BP Location: Left Arm, Patient Position: Sitting, Cuff Size: Normal)   Pulse 71   Wt 169 lb (76.7 kg)   SpO2 98%   BMI 26.53 kg/m  Physical Exam Constitutional:      Appearance: Normal appearance.  HENT:     Head: Normocephalic and atraumatic.  Eyes:     General: Scleral icterus present.  Cardiovascular:     Rate and Rhythm: Normal rate and regular rhythm.  Pulmonary:     Effort: Pulmonary effort is normal.     Breath sounds: Normal breath sounds.  Musculoskeletal:     Cervical back: Neck supple.  Skin:    General: Skin is warm and dry.  Neurological:     General: No focal deficit present.     Mental Status: She is alert.  Psychiatric:        Mood and Affect: Mood normal.        Behavior: Behavior normal.     ------------------------------------------------------------------------------------------------------------------------------------------------------------------------------------------------------------------- Assessment and Plan  No problem-specific Assessment & Plan notes found for this encounter.   Meds ordered this encounter  Medications   DISCONTD: lovastatin (MEVACOR) 20 MG tablet    Sig: Take 1 tablet (20 mg total) by mouth at bedtime.    Dispense:  100 tablet    Refill:  4   DISCONTD: buPROPion (WELLBUTRIN XL) 150 MG 24 hr tablet    Sig: TAKE 3 TABLETS EVERY DAY    Dispense:  270 tablet    Refill:  2   buPROPion (WELLBUTRIN XL) 150 MG 24 hr tablet    Sig: TAKE 3 TABLETS EVERY DAY    Dispense:  270 tablet    Refill:  2   lovastatin (MEVACOR) 20 MG tablet    Sig: Take 1 tablet (20 mg total) by mouth at bedtime.    Dispense:  100 tablet    Refill:  4    Return in about 6 months (around 11/22/2021) for HLD/Anxiety.    This visit occurred during the SARS-CoV-2 public health emergency.  Safety protocols were in place, including screening questions prior to the visit, additional usage of staff PPE, and extensive cleaning of exam room while observing appropriate contact time as indicated for disinfecting solutions.

## 2021-06-11 ENCOUNTER — Telehealth: Payer: Self-pay

## 2021-06-11 NOTE — Telephone Encounter (Signed)
Pt lvm stating she is experiencing viral cold sx's.  Coughing green mucus. Throat is enflamed.  No other sx's.   Asking for recommendations.   Forward to [employer] for patient to schedule virtual appt.

## 2021-06-11 NOTE — Telephone Encounter (Signed)
Virtual has been scheduled for in the morning at 11am with Dr Zigmund Daniel. Patient stated she thinks she has had a bad virus & that she is starting to feel a little better and she may call and cancel the appointment in the morning if she feels better. AM

## 2021-06-12 ENCOUNTER — Encounter: Payer: Medicare Other | Admitting: Physician Assistant

## 2021-06-12 ENCOUNTER — Telehealth (INDEPENDENT_AMBULATORY_CARE_PROVIDER_SITE_OTHER): Payer: Medicare Other | Admitting: Family Medicine

## 2021-06-12 ENCOUNTER — Encounter: Payer: Self-pay | Admitting: Family Medicine

## 2021-06-12 DIAGNOSIS — J069 Acute upper respiratory infection, unspecified: Secondary | ICD-10-CM

## 2021-06-12 NOTE — Progress Notes (Signed)
Symptoms started Sunday Sore throat Productive cough Body aches Denies fever Been taking Ibuprofen BID, drinking a lot of fluids

## 2021-06-12 NOTE — Progress Notes (Signed)
Cindy [name] - 70 y.o. female MRN [id number]  Date of birth: [birth year]   This visit type was conducted due to national recommendations for restrictions regarding the COVID-19 Pandemic (e.g. social distancing).  This format is felt to be most appropriate for this patient at this time.  All issues noted in this document were discussed and addressed.  No physical exam was performed (except for noted visual exam findings with Video Visits).  I discussed the limitations of evaluation and management by telemedicine and the availability of in person appointments. The patient expressed understanding and agreed to proceed.  I connected withNAME@ on 06/12/21 at 11:10 AM EDT by a video enabled telemedicine application and verified that I am speaking with the correct person using two identifiers.  Present at visit: Cindy Nutting, DO [name]   Patient Location: [facility] [place] [address]   Provider location:   [place]  No chief complaint on file.   HPI  Cindy [name] is a 70 y.o. female who presents via audio/video conferencing for a telehealth visit today She complains of coryza, congestion, sore throat, and productive cough for 4 days. She denies a history of chest pain, chills, fevers, nausea, shortness of breath, vomiting, and wheezing and denies a history of asthma. Patient does not smoke cigarettes.  Symptoms improving today.     ROS:  A comprehensive ROS was completed and negative except as noted per HPI  Past Medical History:  Diagnosis Date   Arthritis    Cancer ([place])    breast   Depression    GERD (gastroesophageal reflux disease)    Hyperlipidemia    Tendonitis     Past Surgical History:  Procedure Laterality Date   BREAST SURGERY     right   ELBOW SURGERY     right    Family History  Problem Relation Age of Onset   Cancer Mother        bone   Hypertension Father    Diabetes Father    Stroke Father    Hypertension Sister    Alzheimer's  disease Brother    Hypertension Sister    Colon cancer Neg Hx    Esophageal cancer Neg Hx    Rectal cancer Neg Hx    Stomach cancer Neg Hx     Social History   Socioeconomic History   Marital status: Married    Spouse name: Cindy [name]   Number of children: Not on file   Years of education: Not on file   Highest education level: Not on file  Occupational History   Not on file  Tobacco Use   Smoking status: Never   Smokeless tobacco: Never  Vaping Use   Vaping Use: Never used  Substance and Sexual Activity   Alcohol use: Yes    Comment: 1-2 glasses of wine a month   Drug use: No   Sexual activity: Yes    Birth control/protection: Post-menopausal  Other Topics Concern   Not on file  Social History Narrative   Not on file   Social Determinants of Health   Financial Resource Strain: Not on file  Food Insecurity: Not on file  Transportation Needs: Not on file  Physical Activity: Not on file  Stress: Not on file  Social Connections: Not on file  Intimate Partner Violence: Not on file     Current Outpatient Medications:    acyclovir cream (ZOVIRAX) 5 %, Apply 1 application topically every 3 (three) hours., Disp: 15  g, Rfl: 0   buPROPion (WELLBUTRIN XL) 150 MG 24 hr tablet, TAKE 3 TABLETS EVERY DAY, Disp: 270 tablet, Rfl: 2   Calcium Carbonate-Vit D-Min (CALCIUM 1200 PO), Take 1 tablet by mouth daily., Disp: , Rfl:    Cholecalciferol (VITAMIN D3 PO), Take by mouth., Disp: , Rfl:    clonazePAM (KLONOPIN) 1 MG tablet, TAKE 1 TABLET TWICE DAILY, Disp: 60 tablet, Rfl: 2   dicyclomine (BENTYL) 20 MG tablet, TAKE 1 TABLET THREE TIMES DAILY AS NEEDED, Disp: 90 tablet, Rfl: 3   docusate sodium (COLACE) 100 MG capsule, Take 100 mg by mouth at bedtime., Disp: , Rfl:    lovastatin (MEVACOR) 20 MG tablet, Take 1 tablet (20 mg total) by mouth at bedtime., Disp: 100 tablet, Rfl: 4   Multiple Vitamin (MULTIVITAMIN) tablet, Take 1 tablet by mouth daily., Disp: , Rfl:    omeprazole (PRILOSEC)  40 MG capsule, TAKE 1 CAPSULE EVERY DAY, Disp: 90 capsule, Rfl: 1   ondansetron (ZOFRAN ODT) 4 MG disintegrating tablet, Take 1 tablet (4 mg total) by mouth every 8 (eight) hours as needed., Disp: 20 tablet, Rfl: 0   PARoxetine (PAXIL) 20 MG tablet, Take 2 tablets (40 mg total) by mouth daily for 90 doses., Disp: 180 tablet, Rfl: 3   polyethylene glycol (MIRALAX / GLYCOLAX) 17 g packet, Take 17 g by mouth daily as needed., Disp: , Rfl:    trimethoprim (TRIMPEX) 100 MG tablet, Take 1 tablet (100 mg total) by mouth daily., Disp: 30 tablet, Rfl: 0   trimethoprim-polymyxin b (POLYTRIM) ophthalmic solution, 1 drop every 4 hours to affected eye., Disp: 10 mL, Rfl: 0   valACYclovir (VALTREX) 500 MG tablet, Take 1 tablet (500 mg total) by mouth daily., Disp: 90 tablet, Rfl: 1   Zinc Sulfate (ZINC 15 PO), Take by mouth., Disp: , Rfl:   EXAM:  VITALS per patient if applicable: There were no vitals taken for this visit.  GENERAL: alert, oriented, appears well and in no acute distress  HEENT: atraumatic, conjunttiva clear, no obvious abnormalities on inspection of external nose and ears  NECK: normal movements of the head and neck  LUNGS: on inspection no signs of respiratory distress, breathing rate appears normal, no obvious gross SOB, gasping or wheezing  CV: no obvious cyanosis  MS: moves all visible extremities without noticeable abnormality  PSYCH/NEURO: pleasant and cooperative, no obvious depression or anxiety, speech and thought processing grossly intact  ASSESSMENT AND PLAN:  Discussed the following assessment and plan:  URI (upper respiratory infection) Symptoms improving and I would expect these to continue to improve over the next few days.  She may continue supportive care with good fluid intake and symptom manamgement with OTC medications as needed.  Recommend that if symptoms worsen she take COVID test and contact clinic.      I discussed the assessment and treatment plan with  the patient. The patient was provided an opportunity to ask questions and all were answered. The patient agreed with the plan and demonstrated an understanding of the instructions.   The patient was advised to call back or seek an in-person evaluation if the symptoms worsen or if the condition fails to improve as anticipated.    Cindy Nutting, DO

## 2021-06-12 NOTE — Assessment & Plan Note (Signed)
Symptoms improving and I would expect these to continue to improve over the next few days.  She may continue supportive care with good fluid intake and symptom manamgement with OTC medications as needed.  Recommend that if symptoms worsen she take COVID test and contact clinic.

## 2021-06-12 NOTE — Progress Notes (Signed)
Erroneous encounter -- patient trying to get on video with her PCP who she has appt with.

## 2021-06-22 DIAGNOSIS — Z20822 Contact with and (suspected) exposure to covid-19: Secondary | ICD-10-CM | POA: Diagnosis not present

## 2021-06-25 ENCOUNTER — Other Ambulatory Visit: Payer: Self-pay | Admitting: Family Medicine

## 2021-07-07 DIAGNOSIS — D1801 Hemangioma of skin and subcutaneous tissue: Secondary | ICD-10-CM | POA: Diagnosis not present

## 2021-07-07 DIAGNOSIS — L821 Other seborrheic keratosis: Secondary | ICD-10-CM | POA: Diagnosis not present

## 2021-07-07 DIAGNOSIS — D225 Melanocytic nevi of trunk: Secondary | ICD-10-CM | POA: Diagnosis not present

## 2021-07-07 DIAGNOSIS — D485 Neoplasm of uncertain behavior of skin: Secondary | ICD-10-CM | POA: Diagnosis not present

## 2021-07-14 ENCOUNTER — Other Ambulatory Visit: Payer: Self-pay | Admitting: Family Medicine

## 2021-07-14 DIAGNOSIS — Z1231 Encounter for screening mammogram for malignant neoplasm of breast: Secondary | ICD-10-CM | POA: Diagnosis not present

## 2021-07-14 LAB — HM MAMMOGRAPHY

## 2021-07-17 ENCOUNTER — Encounter: Payer: Self-pay | Admitting: Family Medicine

## 2021-07-29 DIAGNOSIS — L988 Other specified disorders of the skin and subcutaneous tissue: Secondary | ICD-10-CM | POA: Diagnosis not present

## 2021-07-29 DIAGNOSIS — D485 Neoplasm of uncertain behavior of skin: Secondary | ICD-10-CM | POA: Diagnosis not present

## 2021-08-21 DIAGNOSIS — Z23 Encounter for immunization: Secondary | ICD-10-CM | POA: Diagnosis not present

## 2021-09-01 DIAGNOSIS — Z23 Encounter for immunization: Secondary | ICD-10-CM | POA: Diagnosis not present

## 2021-09-02 ENCOUNTER — Encounter: Payer: Self-pay | Admitting: Family Medicine

## 2021-09-02 NOTE — Telephone Encounter (Signed)
Recommend adding probiotic.  She can try immodium for a couple of days. If worsening recommend urgent care/ED evaluation.  CM

## 2021-09-02 NOTE — Telephone Encounter (Signed)
Pt informed.  Pt expressed understanding and is agreeable.  T. Nayib Remer, CMA  

## 2021-09-04 ENCOUNTER — Telehealth: Payer: Self-pay | Admitting: Family Medicine

## 2021-09-04 NOTE — Telephone Encounter (Signed)
Pls let Dr Zigmund Daniel know I started improving with Probiotics.  I'm going to take one probiotic daily for a week or so to be sure I have gotten the problem resolved.

## 2021-09-05 ENCOUNTER — Telehealth: Payer: Self-pay

## 2021-09-05 NOTE — Telephone Encounter (Signed)
Called pt to f/u re: OV appt since she cancelled her appt on 09/09/2021.  Pt states that the probiotic is helping her so much that she would like to wait and see how she does using the probiotic.  Advised pt to call us back next week if symptoms worsen and she would like to be seen.  Pt expressed understanding.  Charyl Bigger, CMA

## 2021-09-09 ENCOUNTER — Ambulatory Visit: Payer: Medicare Other | Admitting: Family Medicine

## 2021-10-13 ENCOUNTER — Encounter: Payer: Self-pay | Admitting: Family Medicine

## 2021-10-24 ENCOUNTER — Other Ambulatory Visit: Payer: Self-pay | Admitting: Family Medicine

## 2021-10-27 ENCOUNTER — Encounter: Payer: Self-pay | Admitting: Family Medicine

## 2021-11-03 ENCOUNTER — Other Ambulatory Visit: Payer: Self-pay

## 2021-11-03 MED ORDER — PAROXETINE HCL 20 MG PO TABS: 40.0000 mg | ORAL_TABLET | Freq: Every day | ORAL | 3 refills | Status: DC

## 2021-11-06 DIAGNOSIS — Z20828 Contact with and (suspected) exposure to other viral communicable diseases: Secondary | ICD-10-CM | POA: Diagnosis not present

## 2021-11-12 ENCOUNTER — Encounter: Payer: Self-pay | Admitting: Family Medicine

## 2021-11-13 NOTE — Telephone Encounter (Signed)
Spoke with pt and scheduled with Samuel Bouche, DNP for 11/14/2021 at 11:10am per [place].  Charyl Bigger, CMA

## 2021-11-14 ENCOUNTER — Other Ambulatory Visit: Payer: Self-pay

## 2021-11-14 ENCOUNTER — Ambulatory Visit (INDEPENDENT_AMBULATORY_CARE_PROVIDER_SITE_OTHER): Payer: Medicare Other | Admitting: Medical-Surgical

## 2021-11-14 ENCOUNTER — Encounter: Payer: Self-pay | Admitting: Medical-Surgical

## 2021-11-14 VITALS — BP 127/72 | HR 76 | Resp 20 | Ht 66.93 in | Wt 172.0 lb

## 2021-11-14 DIAGNOSIS — K6289 Other specified diseases of anus and rectum: Secondary | ICD-10-CM

## 2021-11-14 DIAGNOSIS — R140 Abdominal distension (gaseous): Secondary | ICD-10-CM

## 2021-11-14 DIAGNOSIS — R21 Rash and other nonspecific skin eruption: Secondary | ICD-10-CM | POA: Diagnosis not present

## 2021-11-14 DIAGNOSIS — R197 Diarrhea, unspecified: Secondary | ICD-10-CM | POA: Diagnosis not present

## 2021-11-14 MED ORDER — HYDROCORTISONE ACETATE 25 MG RE SUPP: 25.0000 mg | Freq: Two times a day (BID) | RECTAL | 2 refills | Status: DC | PRN

## 2021-11-14 MED ORDER — HYDROCORTISONE (PERIANAL) 2.5 % EX CREA: 1.0000 "application " | TOPICAL_CREAM | Freq: Two times a day (BID) | CUTANEOUS | 0 refills | Status: DC

## 2021-11-14 MED ORDER — TRIAMCINOLONE ACETONIDE 0.1 % EX CREA: 1.0000 "application " | TOPICAL_CREAM | Freq: Two times a day (BID) | CUTANEOUS | 0 refills | Status: DC

## 2021-11-14 NOTE — Progress Notes (Signed)
HPI with pertinent ROS:   CC: Diarrhea, bloating, fatigue, anal irritation  HPI: Very pleasant 70 year old female presenting today for evaluation of GI concerns that have been going on for the past couple of weeks.  Notes that she has had frequent diarrhea with loose stools every day.  She is also experienced explosive gas.  She has been very careful of her dietary intake and avoiding foods that may cause issues.  She is eating a bland diet and aiming for clear liquids when needed.  Notes that her stool has appeared like slivers over the past week which consists concerning.  Her stool is brown but not formed like usual.  She has hemorrhoids that frequently get inflamed and occasionally bleed but no blood in her stool or melena.  No nausea or vomiting.  Her biggest concern at this point is that the skin around her rectal area has become overall due to her frequency of bowel movements.  She has been using Vaseline for comfort but has not tried other over-the-counter medications.  Notes that she does keep 90-year-old twins and they are 109-1/2-year-old sister 1 day/week.  Wonders if she may have caught a virus or some other issue from them.  Has a tendency to scratch at night and has multiple sores in various stages of healing on her lower extremities.  Would like to have a cream for this.  She has been using bacitracin ointment that was leftover from something else on the lesions.  Some of them are healing well and others are not progressing as quickly as she would like.  She does try to keep these areas covered at night so that she will not scratch them but sometimes these efforts are not successful.  I reviewed the past medical history, family history, social history, surgical history, and allergies today and no changes were needed.  Please see the problem list section below in epic for further details.   Physical exam:   General: Well Developed, well nourished, and in no acute distress.  Neuro: Alert  and oriented x3.  HEENT: Normocephalic, atraumatic.  Skin: Warm and dry.  Multiple scabbed lesions over the anterior aspects of her bilateral lower extremities.  These are in various stages of healing and do not exhibit any signs of cellulitis or local infection. Cardiac: Regular rate and rhythm, no murmurs rubs or gallops, no lower extremity edema.  Respiratory: Clear to auscultation bilaterally. Not using accessory muscles, speaking in full sentences. Abdomen: Soft, nontender, nondistended. Bowel sounds + x 4 quadrants. No HSM appreciated.   Impression and Recommendations:    1. Diarrhea, unspecified type 2. Bloating 3. Anus irritation Getting stool studies today.  Treating rectal irritation with Anusol topically but we will send suppositories in for internal hemorrhoids as well.  Checking CBC with differential for further evaluation. - Stool, WBC/Lactoferrin - C. difficile GDH and Toxin A/B - Ova and parasite examination - hydrocortisone (ANUSOL-HC) 25 MG suppository; Place 1 suppository (25 mg total) rectally 2 (two) times daily as needed for hemorrhoids.  Dispense: 24 suppository; Refill: 2 - hydrocortisone (ANUSOL-HC) 2.5 % rectal cream; Place 1 application rectally 2 (two) times daily.  Dispense: 30 g; Refill: 0 - triamcinolone cream (KENALOG) 0.1 %; Apply 1 application topically 2 (two) times daily.  Dispense: 30 g; Refill: 0 - CBC with Differential - Giardia antigen - Cryptosporidium Antigen, EIA  4. Rash Sending in triamcinolone cream to use twice daily as needed to lower extremity lesions.  Advised to avoid using this for  more than 14 days in a row.  Continue to cover the area in question to prevent further excoriation.  Avoid using antibiotic ointments for more than 3 days as this can worsen irritation.  Return if symptoms worsen or fail to improve. ___________________________________________ Clearnce Sorrel, DNP, APRN, FNP-BC [facility]

## 2021-11-15 LAB — CBC WITH DIFFERENTIAL/PLATELET
Absolute Monocytes: 484 cells/uL (ref 200–950)
Basophils Absolute: 12 cells/uL (ref 0–200)
Basophils Relative: 0.2 %
Eosinophils Absolute: 53 cells/uL (ref 15–500)
Eosinophils Relative: 0.9 %
HCT: 42.2 % (ref 35.0–45.0)
Hemoglobin: 13.6 g/dL (ref 11.7–15.5)
Lymphs Abs: 1623 cells/uL (ref 850–3900)
MCH: 28.3 pg (ref 27.0–33.0)
MCHC: 32.2 g/dL (ref 32.0–36.0)
MCV: 87.9 fL (ref 80.0–100.0)
MPV: 12.4 fL (ref 7.5–12.5)
Monocytes Relative: 8.2 %
Neutro Abs: 3729 cells/uL (ref 1500–7800)
Neutrophils Relative %: 63.2 %
Platelets: 227 10*3/uL (ref 140–400)
RBC: 4.8 10*6/uL (ref 3.80–5.10)
RDW: 13.8 % (ref 11.0–15.0)
Total Lymphocyte: 27.5 %
WBC: 5.9 10*3/uL (ref 3.8–10.8)

## 2021-11-18 ENCOUNTER — Encounter: Payer: Self-pay | Admitting: Medical-Surgical

## 2021-11-18 LAB — FECAL LACTOFERRIN, QUANT
Fecal Lactoferrin: POSITIVE — AB
MICRO NUMBER:: 12712397
SPECIMEN QUALITY:: ADEQUATE

## 2021-11-18 LAB — GIARDIA ANTIGEN
MICRO NUMBER:: 12713999
RESULT:: NOT DETECTED
SPECIMEN QUALITY:: ADEQUATE

## 2021-11-18 LAB — C. DIFFICILE GDH AND TOXIN A/B
GDH ANTIGEN: NOT DETECTED
MICRO NUMBER:: 12712405
SPECIMEN QUALITY:: ADEQUATE
TOXIN A AND B: NOT DETECTED

## 2021-11-18 LAB — CRYPTOSPORIDIUM ANTIGEN, EIA
Specimen Quality:: ADEQUATE
micro Number:: 12714001

## 2021-11-18 LAB — OVA AND PARASITE EXAMINATION
CONCENTRATE RESULT:: NONE SEEN
MICRO NUMBER:: 12713992
SPECIMEN QUALITY:: ADEQUATE
TRICHROME RESULT:: NONE SEEN

## 2021-11-18 NOTE — Telephone Encounter (Signed)
Message appears to be meant for Joy.

## 2021-11-19 ENCOUNTER — Encounter: Payer: Self-pay | Admitting: Medical-Surgical

## 2021-11-26 ENCOUNTER — Encounter: Payer: Self-pay | Admitting: Gastroenterology

## 2021-11-26 ENCOUNTER — Ambulatory Visit (INDEPENDENT_AMBULATORY_CARE_PROVIDER_SITE_OTHER): Payer: Medicare Other | Admitting: Gastroenterology

## 2021-11-26 VITALS — BP 130/74 | HR 88 | Ht 68.0 in | Wt 172.0 lb

## 2021-11-26 DIAGNOSIS — R197 Diarrhea, unspecified: Secondary | ICD-10-CM

## 2021-11-26 DIAGNOSIS — R1030 Lower abdominal pain, unspecified: Secondary | ICD-10-CM | POA: Diagnosis not present

## 2021-11-26 NOTE — Patient Instructions (Signed)
Please remain on low fat diet. Also, avoid raw fruits and vegetables and lactose.   Call our office if you continue to have diarrhea symptoms.   The  GI providers would like to encourage you to use [facility] to communicate with providers for non-urgent requests or questions.  Due to long hold times on the telephone, sending your provider a message by [facility] may be a faster and more efficient way to get a response.  Please allow 48 business hours for a response.  Please remember that this is for non-urgent requests.   Thank you for choosing me and [facility].  Pricilla Riffle. Dagoberto Ligas., MD., [facility] Regal

## 2021-11-26 NOTE — Progress Notes (Signed)
° ° °  History of Present Illness: This is a 70 year old female with acute onset of diarrhea, lower abdominal pain, thinner stools for one week.  Her symptoms have improved however after few days of improvement she had another episode of loose diarrhea with mild lower abdominal pain.  Shortly after her symptoms started she saw her PCP stool for lactoferrin, C. difficile, ova and parasites, Cryptosporidium and Giardia were sent and were all negative except for positive lactoferrin.  Stool for enteric pathogens was not sent.   Colonoscopy 08/2019 - One 8 mm polyp in the transverse colon, removed with a cold snare. Resected and retrieved. - One 4 mm polyp in the rectum, removed with a cold biopsy forceps. Resected and retrieved. - Anal papilla(e) were hypertrophied. - The examination was otherwise normal on direct and retroflexion views. Path: hyperplastic   Current Medications, Allergies, Past Medical History, Past Surgical History, Family History and Social History were reviewed in [employer] record.   Physical Exam: General: Well developed, well nourished, no acute distress Head: Normocephalic and atraumatic Eyes: Sclerae anicteric, EOMI Ears: Normal auditory acuity Mouth: Not examined, mask on during Covid-19 pandemic Lungs: Clear throughout to auscultation Heart: Regular rate and rhythm; no murmurs, rubs or bruits Abdomen: Soft, non tender and non distended. No masses, hepatosplenomegaly or hernias noted. Normal Bowel sounds Rectal: Musculoskeletal: Symmetrical with no gross deformities  Pulses:  Normal pulses noted Extremities: No clubbing, cyanosis, edema or deformities noted Neurological: Alert oriented x 4, grossly nonfocal Psychological:  Alert and cooperative. Normal mood and affect   Assessment and Recommendations:  Suspected acute infectious diarrhea or food poisoning.  Symptoms had improved but had a mild return in symptoms.  She may have a mild  postinfectious diarrhea.  She is advised to eat a bland diet, low-fat, no lactose, no raw fruits, no raw vegetables until her symptoms abate.  Continue dicyclomine 20 mg p.o. 3 times daily as needed.  If she has a substantial return of persistent diarrhea we will proceed with a stool GI profile. GERD.  Follow antireflux measures and continue omeprazole 40 mg daily.

## 2021-11-30 ENCOUNTER — Other Ambulatory Visit: Payer: Self-pay | Admitting: Family Medicine

## 2021-12-01 ENCOUNTER — Ambulatory Visit: Payer: Medicare Other | Admitting: Family Medicine

## 2021-12-02 ENCOUNTER — Ambulatory Visit (INDEPENDENT_AMBULATORY_CARE_PROVIDER_SITE_OTHER): Payer: Medicare Other | Admitting: Family Medicine

## 2021-12-02 ENCOUNTER — Other Ambulatory Visit: Payer: Self-pay

## 2021-12-02 ENCOUNTER — Encounter: Payer: Self-pay | Admitting: Family Medicine

## 2021-12-02 VITALS — BP 108/63 | HR 74 | Ht 68.0 in | Wt 169.0 lb

## 2021-12-02 DIAGNOSIS — F418 Other specified anxiety disorders: Secondary | ICD-10-CM

## 2021-12-02 DIAGNOSIS — E7800 Pure hypercholesterolemia, unspecified: Secondary | ICD-10-CM

## 2021-12-02 LAB — CBC WITH DIFFERENTIAL/PLATELET
Absolute Monocytes: 594 cells/uL (ref 200–950)
Basophils Absolute: 20 cells/uL (ref 0–200)
Basophils Relative: 0.3 %
Eosinophils Absolute: 53 cells/uL (ref 15–500)
Eosinophils Relative: 0.8 %
HCT: 40.5 % (ref 35.0–45.0)
Hemoglobin: 13.4 g/dL (ref 11.7–15.5)
Lymphs Abs: 2765 cells/uL (ref 850–3900)
MCH: 28.8 pg (ref 27.0–33.0)
MCHC: 33.1 g/dL (ref 32.0–36.0)
MCV: 86.9 fL (ref 80.0–100.0)
MPV: 12.7 fL — ABNORMAL HIGH (ref 7.5–12.5)
Monocytes Relative: 9 %
Neutro Abs: 3168 cells/uL (ref 1500–7800)
Neutrophils Relative %: 48 %
Platelets: 222 10*3/uL (ref 140–400)
RBC: 4.66 10*6/uL (ref 3.80–5.10)
RDW: 14 % (ref 11.0–15.0)
Total Lymphocyte: 41.9 %
WBC: 6.6 10*3/uL (ref 3.8–10.8)

## 2021-12-02 LAB — COMPLETE METABOLIC PANEL WITH GFR
AG Ratio: 1.9 (calc) (ref 1.0–2.5)
ALT: 18 U/L (ref 6–29)
AST: 20 U/L (ref 10–35)
Albumin: 4.5 g/dL (ref 3.6–5.1)
Alkaline phosphatase (APISO): 92 U/L (ref 37–153)
BUN: 7 mg/dL (ref 7–25)
CO2: 29 mmol/L (ref 20–32)
Calcium: 9.4 mg/dL (ref 8.6–10.4)
Chloride: 103 mmol/L (ref 98–110)
Creat: 0.92 mg/dL (ref 0.50–1.05)
Globulin: 2.4 g/dL (calc) (ref 1.9–3.7)
Glucose, Bld: 92 mg/dL (ref 65–99)
Potassium: 4.5 mmol/L (ref 3.5–5.3)
Sodium: 140 mmol/L (ref 135–146)
Total Bilirubin: 0.4 mg/dL (ref 0.2–1.2)
Total Protein: 6.9 g/dL (ref 6.1–8.1)
eGFR: 68 mL/min/{1.73_m2} (ref >=60)

## 2021-12-02 LAB — LIPID PANEL W/REFLEX DIRECT LDL
Cholesterol: 208 mg/dL — ABNORMAL HIGH (ref <200)
HDL: 82 mg/dL (ref >=50)
LDL Cholesterol (Calc): 107 mg/dL (calc) — ABNORMAL HIGH
Non-HDL Cholesterol (Calc): 126 mg/dL (calc) (ref <130)
Total CHOL/HDL Ratio: 2.5 (calc) (ref <5.0)
Triglycerides: 91 mg/dL (ref <150)

## 2021-12-02 MED ORDER — TRIAMCINOLONE ACETONIDE 0.1 % EX CREA: 1.0000 "application " | TOPICAL_CREAM | Freq: Two times a day (BID) | CUTANEOUS | 0 refills | Status: DC

## 2021-12-02 NOTE — Assessment & Plan Note (Signed)
Updating lipid panel today.  We will continue lovastatin at current strength.

## 2021-12-02 NOTE — Assessment & Plan Note (Signed)
She continues to do well with combination of Paxil with bupropion.  Has clonazepam to utilize as needed which she uses rarely.

## 2021-12-02 NOTE — Progress Notes (Signed)
Cindy [name] - 70 y.o. female MRN [id number]  Date of birth: 09-29-1951  Subjective Chief Complaint  Patient presents with   Follow-up    HPI Cindy [name] is a 70 year old female here today for follow-up visit.  She reports overall she is doing well.  She would like to have annual labs completed today.  Depression anxiety remains well controlled with bupropion and Paxil.  No side effects noted from these.  She has clonazepam that she uses rarely.  She continues to tolerate lovastatin for management of hyperlipidemia.  ROS:  A comprehensive ROS was completed and negative except as noted per HPI  No Known Allergies  Past Medical History:  Diagnosis Date   Arthritis    Cancer ([place])    breast   Depression    GERD (gastroesophageal reflux disease)    Hyperlipidemia    Tendonitis     Past Surgical History:  Procedure Laterality Date   BREAST SURGERY     right   ELBOW SURGERY     right    Social History   Socioeconomic History   Marital status: Married    Spouse name: [name]   Number of children: Not on file   Years of education: Not on file   Highest education level: Not on file  Occupational History   Not on file  Tobacco Use   Smoking status: Never   Smokeless tobacco: Never  Vaping Use   Vaping Use: Never used  Substance and Sexual Activity   Alcohol use: Yes    Comment: 1-2 glasses of wine a month   Drug use: No   Sexual activity: Yes    Birth control/protection: Post-menopausal  Other Topics Concern   Not on file  Social History Narrative   Not on file   Social Determinants of Health   Financial Resource Strain: Not on file  Food Insecurity: Not on file  Transportation Needs: Not on file  Physical Activity: Not on file  Stress: Not on file  Social Connections: Not on file    Family History  Problem Relation Age of Onset   Cancer Mother        bone   Hypertension Father    Diabetes Father    Stroke Father    Hypertension Sister    Alzheimer's  disease Brother    Hypertension Sister    Colon cancer Neg Hx    Esophageal cancer Neg Hx    Rectal cancer Neg Hx    Stomach cancer Neg Hx     Health Maintenance  Topic Date Due   COVID-19 Vaccine (5 - Booster) 10/21/2021   MAMMOGRAM  07/14/2022   TETANUS/TDAP  04/05/2028   COLONOSCOPY (Pts 45-20yrs Insurance coverage will need to be confirmed)  08/28/2029   Pneumonia Vaccine 35+ Years old  Completed   INFLUENZA VACCINE  Completed   DEXA SCAN  Completed   Hepatitis C Screening  Completed   Zoster Vaccines- Shingrix  Completed   HPV VACCINES  Aged Out     ----------------------------------------------------------------------------------------------------------------------------------------------------------------------------------------------------------------- Physical Exam BP 108/63 (BP Location: Left Arm, Patient Position: Sitting, Cuff Size: Normal)    Pulse 74    Ht 5\' 8"  (1.727 m)    Wt 169 lb (76.7 kg)    SpO2 96%    BMI 25.70 kg/m   Physical Exam Constitutional:      General: She is not in acute distress. HENT:     Head: Normocephalic and atraumatic.     Right Ear: Tympanic membrane and  ear canal normal.     Left Ear: Tympanic membrane and ear canal normal.     Nose: Nose normal.  Eyes:     General: No scleral icterus.    Conjunctiva/sclera: Conjunctivae normal.  Neck:     Thyroid: No thyromegaly.  Cardiovascular:     Rate and Rhythm: Normal rate and regular rhythm.     Heart sounds: Normal heart sounds.  Pulmonary:     Effort: Pulmonary effort is normal.     Breath sounds: Normal breath sounds.  Abdominal:     General: Bowel sounds are normal. There is no distension.     Palpations: Abdomen is soft.     Tenderness: There is no abdominal tenderness. There is no guarding.  Musculoskeletal:        General: Normal range of motion.     Cervical back: Normal range of motion and neck supple.  Lymphadenopathy:     Cervical: No cervical adenopathy.  Skin:     General: Skin is warm and dry.     Findings: No rash.  Neurological:     General: No focal deficit present.     Mental Status: She is alert and oriented to person, place, and time.     Cranial Nerves: No cranial nerve deficit.     Coordination: Coordination normal.  Psychiatric:        Mood and Affect: Mood normal.        Behavior: Behavior normal.    ------------------------------------------------------------------------------------------------------------------------------------------------------------------------------------------------------------------- Assessment and Plan  Depression with anxiety She continues to do well with combination of Paxil with bupropion.  Has clonazepam to utilize as needed which she uses rarely.  Hyperlipidemia Updating lipid panel today.  We will continue lovastatin at current strength.   Meds ordered this encounter  Medications   triamcinolone cream (KENALOG) 0.1 %    Sig: Apply 1 application topically 2 (two) times daily.    Dispense:  30 g    Refill:  0    Return in about 6 months (around 06/02/2022) for HLD/MDD.    This visit occurred during the SARS-CoV-2 public health emergency.  Safety protocols were in place, including screening questions prior to the visit, additional usage of staff PPE, and extensive cleaning of exam room while observing appropriate contact time as indicated for disinfecting solutions.

## 2021-12-02 NOTE — Patient Instructions (Signed)
Preventive Care 65 Years and Older, Female °Preventive care refers to lifestyle choices and visits with your health care provider that can promote health and wellness. Preventive care visits are also called wellness exams. °What can I expect for my preventive care visit? °Counseling °Your health care provider may ask you questions about your: °Medical history, including: °Past medical problems. °Family medical history. °Pregnancy and menstrual history. °History of falls. °Current health, including: °Memory and ability to understand (cognition). °Emotional well-being. °Home life and relationship well-being. °Sexual activity and sexual health. °Lifestyle, including: °Alcohol, nicotine or tobacco, and drug use. °Access to firearms. °Diet, exercise, and sleep habits. °Work and work environment. °Sunscreen use. °Safety issues such as seatbelt and bike helmet use. °Physical exam °Your health care provider will check your: °Height and weight. These may be used to calculate your BMI (body mass index). BMI is a measurement that tells if you are at a healthy weight. °Waist circumference. This measures the distance around your waistline. This measurement also tells if you are at a healthy weight and may help predict your risk of certain diseases, such as type 2 diabetes and high blood pressure. °Heart rate and blood pressure. °Body temperature. °Skin for abnormal spots. °What immunizations do I need? °Vaccines are usually given at various ages, according to a schedule. Your health care provider will recommend vaccines for you based on your age, medical history, and lifestyle or other factors, such as travel or where you work. °What tests do I need? °Screening °Your health care provider may recommend screening tests for certain conditions. This may include: °Lipid and cholesterol levels. °Hepatitis C test. °Hepatitis B test. °HIV (human immunodeficiency virus) test. °STI (sexually transmitted infection) testing, if you are at  risk. °Lung cancer screening. °Colorectal cancer screening. °Diabetes screening. This is done by checking your blood sugar (glucose) after you have not eaten for a while (fasting). °Mammogram. Talk with your health care provider about how often you should have regular mammograms. °BRCA-related cancer screening. This may be done if you have a family history of breast, ovarian, tubal, or peritoneal cancers. °Bone density scan. This is done to screen for osteoporosis. °Talk with your health care provider about your test results, treatment options, and if necessary, the need for more tests. °Follow these instructions at home: °Eating and drinking ° °Eat a diet that includes fresh fruits and vegetables, whole grains, lean protein, and low-fat dairy products. Limit your intake of foods with high amounts of sugar, saturated fats, and salt. °Take vitamin and mineral supplements as recommended by your health care provider. °Do not drink alcohol if your health care provider tells you not to drink. °If you drink alcohol: °Limit how much you have to 0-1 drink a day. °Know how much alcohol is in your drink. In the [place] one drink equals one 12 oz bottle of beer (355 mL), one 5 oz glass of wine (148 mL), or one 1½ oz glass of hard liquor (44 mL). °Lifestyle °Brush your teeth every morning and night with fluoride toothpaste. Floss one time each day. °Exercise for at least 30 minutes 5 or more days each week. °Do not use any products that contain nicotine or tobacco. These products include cigarettes, chewing tobacco, and vaping devices, such as e-cigarettes. If you need help quitting, ask your health care provider. °Do not use drugs. °If you are sexually active, practice safe sex. Use a condom or other form of protection in order to prevent STIs. °Take aspirin only as told by your   health care provider. Make sure that you understand how much to take and what form to take. Work with your health care provider to find out whether it  is safe and beneficial for you to take aspirin daily. Ask your health care provider if you need to take a cholesterol-lowering medicine (statin). Find healthy ways to manage stress, such as: Meditation, yoga, or listening to music. Journaling. Talking to a trusted person. Spending time with friends and family. Minimize exposure to UV radiation to reduce your risk of skin cancer. Safety Always wear your seat belt while driving or riding in a vehicle. Do not drive: If you have been drinking alcohol. Do not ride with someone who has been drinking. When you are tired or distracted. While texting. If you have been using any mind-altering substances or drugs. Wear a helmet and other protective equipment during sports activities. If you have firearms in your house, make sure you follow all gun safety procedures. What's next? Visit your health care provider once a year for an annual wellness visit. Ask your health care provider how often you should have your eyes and teeth checked. Stay up to date on all vaccines. This information is not intended to replace advice given to you by your health care provider. Make sure you discuss any questions you have with your health care provider. Document Revised: 05/28/2021 Document Reviewed: 05/28/2021 Elsevier Patient Education  [place].

## 2021-12-15 ENCOUNTER — Telehealth: Payer: Self-pay | Admitting: Family Medicine

## 2021-12-15 MED ORDER — PROMETHAZINE HCL 25 MG PO TABS: 25.0000 mg | ORAL_TABLET | Freq: Three times a day (TID) | ORAL | 0 refills | Status: DC | PRN

## 2021-12-15 NOTE — Telephone Encounter (Signed)
Received call from on -call nurse Brianna.  Pt vomiting about twice a day.  Taking her GERD medication.  Will try to get an appt tomorrow.  Few phenergan sent in  today to see if she can hydrate this morning and if no then needs to go to the ED this afternoon.      Pls call pt to schedule her an acute in person appt

## 2021-12-16 ENCOUNTER — Encounter: Payer: Medicare Other | Admitting: Family Medicine

## 2021-12-16 ENCOUNTER — Other Ambulatory Visit: Payer: Self-pay

## 2021-12-16 ENCOUNTER — Encounter: Payer: Self-pay | Admitting: Medical-Surgical

## 2021-12-16 ENCOUNTER — Ambulatory Visit (INDEPENDENT_AMBULATORY_CARE_PROVIDER_SITE_OTHER): Payer: Medicare Other | Admitting: Medical-Surgical

## 2021-12-16 DIAGNOSIS — R350 Frequency of micturition: Secondary | ICD-10-CM

## 2021-12-16 DIAGNOSIS — N3001 Acute cystitis with hematuria: Secondary | ICD-10-CM

## 2021-12-16 LAB — POCT URINALYSIS DIP (CLINITEK)
Glucose, UA: 100 mg/dL — AB
Nitrite, UA: POSITIVE — AB
POC PROTEIN,UA: >=300 — AB
Spec Grav, UA: >=1.03 — AB (ref 1.010–1.025)
Urobilinogen, UA: 1 E.U./dL
pH, UA: 6 (ref 5.0–8.0)

## 2021-12-16 MED ORDER — PROMETHAZINE HCL 25 MG RE SUPP: 25.0000 mg | Freq: Four times a day (QID) | RECTAL | 0 refills | Status: DC | PRN

## 2021-12-16 MED ORDER — FLUCONAZOLE 150 MG PO TABS: 150.0000 mg | ORAL_TABLET | Freq: Once | ORAL | 0 refills | Status: AC

## 2021-12-16 MED ORDER — SULFAMETHOXAZOLE-TRIMETHOPRIM 800-160 MG PO TABS: 1.0000 | ORAL_TABLET | Freq: Two times a day (BID) | ORAL | 0 refills | Status: DC

## 2021-12-16 NOTE — Telephone Encounter (Signed)
She was seen by Caryl Asp today.

## 2021-12-16 NOTE — Progress Notes (Signed)
This encounter was created in error - please disregard.

## 2021-12-16 NOTE — Progress Notes (Signed)
°  HPI with pertinent ROS:   CC: Urinary frequency   HPI: Very pleasant 71 year old female presenting today for evaluation of urinary symptoms including frequency, urgency, burning, and concentrated urine.  She has also been experiencing significant nausea and vomiting and has been having trouble keeping food/fluids down.  Her symptoms started on Sunday.  At that point, she had an old prescription for trimethoprim 100 mg so she took 2 doses of that with no improvement in symptoms.  No fever or chills.   I reviewed the past medical history, family history, social history, surgical history, and allergies today and no changes were needed.  Please see the problem list section below in epic for further details.   Physical exam:   General: Well Developed, well nourished, and in no acute distress.  Neuro: Alert and oriented x3.  HEENT: Normocephalic, atraumatic.  Skin: Warm and dry. Cardiac: Regular rate and rhythm, no murmurs rubs or gallops, no lower extremity edema.  Respiratory: Clear to auscultation bilaterally. Not using accessory muscles, speaking in full sentences. Abdomen: Soft, nontender, nondistended. Bowel sounds + x 4 quadrants. No HSM appreciated.  No CVA tenderness.  Impression and Recommendations:    1. Urinary frequency POCT urinalysis completed but not enough urine to send for culture.  See UA results below. - POCT URINALYSIS DIP (CLINITEK)  2. Acute cystitis with hematuria Urine positive for nitrites and large leukocytes as well as large amount of blood.  Elevated specific gravity and protein greater than 300 mg/dL.  Start Bactrim 1 tablet twice daily for 7 days.  Sending in [place] for vulvovaginal candidiasis prophylaxis.  Sending in Phenergan suppositories to help with nausea. - sulfamethoxazole-trimethoprim (BACTRIM DS) 800-160 MG tablet; Take 1 tablet by mouth 2 (two) times daily.  Dispense: 14 tablet; Refill: 0  Return if symptoms worsen or fail to  improve. ___________________________________________ Clearnce Sorrel, DNP, APRN, FNP-BC [facility]

## 2021-12-16 NOTE — Patient Instructions (Signed)
Urinary Tract Infection, Adult A urinary tract infection (UTI) is an infection of any part of the urinary tract. The urinary tract includes the kidneys, ureters, bladder, and urethra. These organs make, store, and get rid of urine in the body. An upper UTI affects the ureters and kidneys. A lower UTI affects the bladder and urethra. What are the causes? Most urinary tract infections are caused by bacteria in your genital area around your urethra, where urine leaves your body. These bacteria grow and cause inflammation of your urinary tract. What increases the risk? You are more likely to develop this condition if: You have a urinary catheter that stays in place. You are not able to control when you urinate or have a bowel movement (incontinence). You are female and you: Use a spermicide or diaphragm for birth control. Have low estrogen levels. Are pregnant. You have certain genes that increase your risk. You are sexually active. You take antibiotic medicines. You have a condition that causes your flow of urine to slow down, such as: An enlarged prostate, if you are female. Blockage in your urethra. A kidney stone. A nerve condition that affects your bladder control (neurogenic bladder). Not getting enough to drink, or not urinating often. You have certain medical conditions, such as: Diabetes. A weak disease-fighting system (immunesystem). Sickle cell disease. Gout. Spinal cord injury. What are the signs or symptoms? Symptoms of this condition include: Needing to urinate right away (urgency). Frequent urination. This may include small amounts of urine each time you urinate. Pain or burning with urination. Blood in the urine. Urine that smells bad or unusual. Trouble urinating. Cloudy urine. Vaginal discharge, if you are female. Pain in the abdomen or the lower back. You may also have: Vomiting or a decreased appetite. Confusion. Irritability or tiredness. A fever or  chills. Diarrhea. The first symptom in older adults may be confusion. In some cases, they may not have any symptoms until the infection has worsened. How is this diagnosed? This condition is diagnosed based on your medical history and a physical exam. You may also have other tests, including: Urine tests. Blood tests. Tests for STIs (sexually transmitted infections). If you have had more than one UTI, a cystoscopy or imaging studies may be done to determine the cause of the infections. How is this treated? Treatment for this condition includes: Antibiotic medicine. Over-the-counter medicines to treat discomfort. Drinking enough water to stay hydrated. If you have frequent infections or have other conditions such as a kidney stone, you may need to see a health care provider who specializes in the urinary tract (urologist). In rare cases, urinary tract infections can cause sepsis. Sepsis is a life-threatening condition that occurs when the body responds to an infection. Sepsis is treated in the hospital with IV antibiotics, fluids, and other medicines. Follow these instructions at home: Medicines Take over-the-counter and prescription medicines only as told by your health care provider. If you were prescribed an antibiotic medicine, take it as told by your health care provider. Do not stop using the antibiotic even if you start to feel better. General instructions Make sure you: Empty your bladder often and completely. Do not hold urine for long periods of time. Empty your bladder after sex. Wipe from front to back after urinating or having a bowel movement if you are female. Use each tissue only one time when you wipe. Drink enough fluid to keep your urine pale yellow. Keep all follow-up visits. This is important. Contact a health care provider   if: Your symptoms do not get better after 1-2 days. Your symptoms go away and then return. Get help right away if: You have severe pain in your  back or your lower abdomen. You have a fever or chills. You have nausea or vomiting. Summary A urinary tract infection (UTI) is an infection of any part of the urinary tract, which includes the kidneys, ureters, bladder, and urethra. Most urinary tract infections are caused by bacteria in your genital area. Treatment for this condition often includes antibiotic medicines. If you were prescribed an antibiotic medicine, take it as told by your health care provider. Do not stop using the antibiotic even if you start to feel better. Keep all follow-up visits. This is important. This information is not intended to replace advice given to you by your health care provider. Make sure you discuss any questions you have with your health care provider. Document Revised: 07/12/2020 Document Reviewed: 07/12/2020 Elsevier Patient Education  2022 [employer]  

## 2021-12-19 ENCOUNTER — Telehealth: Payer: Self-pay

## 2021-12-19 NOTE — Telephone Encounter (Signed)
Pt called and said that she is feeling worse since her office visit on Jan 3rd. Pt was prescribed bactrium for her UTI and is taking medication as prescribed. Please advise, thanks.

## 2021-12-22 ENCOUNTER — Other Ambulatory Visit: Payer: Self-pay

## 2021-12-22 DIAGNOSIS — N3001 Acute cystitis with hematuria: Secondary | ICD-10-CM

## 2021-12-22 DIAGNOSIS — R350 Frequency of micturition: Secondary | ICD-10-CM

## 2021-12-22 NOTE — Telephone Encounter (Signed)
Spoke to pt, pt still not feeling well. Pt will go by the lab to give another urine sample.

## 2021-12-23 ENCOUNTER — Ambulatory Visit (INDEPENDENT_AMBULATORY_CARE_PROVIDER_SITE_OTHER): Payer: Medicare Other | Admitting: Family Medicine

## 2021-12-23 ENCOUNTER — Encounter: Payer: Self-pay | Admitting: Family Medicine

## 2021-12-23 ENCOUNTER — Other Ambulatory Visit: Payer: Self-pay

## 2021-12-23 VITALS — BP 116/64 | HR 53 | Temp 97.9°F | Ht 68.0 in | Wt 162.0 lb

## 2021-12-23 DIAGNOSIS — R319 Hematuria, unspecified: Secondary | ICD-10-CM

## 2021-12-23 DIAGNOSIS — Z20822 Contact with and (suspected) exposure to covid-19: Secondary | ICD-10-CM | POA: Diagnosis not present

## 2021-12-23 DIAGNOSIS — N3090 Cystitis, unspecified without hematuria: Secondary | ICD-10-CM | POA: Diagnosis not present

## 2021-12-23 DIAGNOSIS — N390 Urinary tract infection, site not specified: Secondary | ICD-10-CM

## 2021-12-23 MED ORDER — CEFTRIAXONE SODIUM 1 G IJ SOLR
1.0000 g | Freq: Once | INTRAMUSCULAR | Status: AC
  Administered 2021-12-23: 1 g via INTRAMUSCULAR

## 2021-12-23 NOTE — Assessment & Plan Note (Addendum)
Urinalysis completed yesterday shows continued signs of UTI.  Culture still pending.  She has having worsening symptoms of fatigue and malaise.  Given injection of Rocephin today as she has worsened since starting Bactrim.  She was also swabbed for COVID.  Checking CMP and CBC.  She will continue Bactrim for now until we get culture results back.

## 2021-12-23 NOTE — Progress Notes (Signed)
Cindy [name] - 71 y.o. female MRN [id number]  Date of birth: 1951/10/24  Subjective Chief Complaint  Patient presents with   Fatigue    HPI Cindy [name] is a 71 year old female here today for follow-up of recent UTI.  Since her last visit her symptoms of frequency and dysuria have improved however she has developed increased fatigue.  She has had some body aches and continues to have some mild nausea.  She denies fever, cough, shortness of breath, chest pain, headaches.  She is drinking adequate fluids.  ROS:  A comprehensive ROS was completed and negative except as noted per HPI  No Known Allergies  Past Medical History:  Diagnosis Date   Arthritis    Cancer ([facility])    breast   Depression    GERD (gastroesophageal reflux disease)    Hyperlipidemia    Tendonitis     Past Surgical History:  Procedure Laterality Date   BREAST SURGERY     right   ELBOW SURGERY     right    Social History   Socioeconomic History   Marital status: Married    Spouse name: [name]   Number of children: Not on file   Years of education: Not on file   Highest education level: Not on file  Occupational History   Not on file  Tobacco Use   Smoking status: Never   Smokeless tobacco: Never  Vaping Use   Vaping Use: Never used  Substance and Sexual Activity   Alcohol use: Yes    Comment: 1-2 glasses of wine a month   Drug use: No   Sexual activity: Yes    Birth control/protection: Post-menopausal  Other Topics Concern   Not on file  Social History Narrative   Not on file   Social Determinants of Health   Financial Resource Strain: Not on file  Food Insecurity: Not on file  Transportation Needs: Not on file  Physical Activity: Not on file  Stress: Not on file  Social Connections: Not on file    Family History  Problem Relation Age of Onset   Cancer Mother        bone   Hypertension Father    Diabetes Father    Stroke Father    Hypertension Sister    Alzheimer's disease Brother     Hypertension Sister    Colon cancer Neg Hx    Esophageal cancer Neg Hx    Rectal cancer Neg Hx    Stomach cancer Neg Hx     Health Maintenance  Topic Date Due   COVID-19 Vaccine (5 - Booster) 01/01/2022 (Originally 10/21/2021)   MAMMOGRAM  07/14/2022   TETANUS/TDAP  04/05/2028   COLONOSCOPY (Pts 45-13yrs Insurance coverage will need to be confirmed)  08/28/2029   Pneumonia Vaccine 63+ Years old  Completed   INFLUENZA VACCINE  Completed   DEXA SCAN  Completed   Hepatitis C Screening  Completed   Zoster Vaccines- Shingrix  Completed   HPV VACCINES  Aged Out     ----------------------------------------------------------------------------------------------------------------------------------------------------------------------------------------------------------------- Physical Exam BP 116/64 (BP Location: Left Arm, Patient Position: Sitting, Cuff Size: Normal)    Pulse (!) 53    Temp 97.9 F (36.6 C)    Ht 5\' 8"  (1.727 m)    Wt 162 lb (73.5 kg)    SpO2 91%    BMI 24.63 kg/m   Physical Exam Constitutional:      Appearance: Normal appearance.  Eyes:     General: No scleral icterus. Cardiovascular:  Rate and Rhythm: Normal rate and regular rhythm.  Pulmonary:     Effort: Pulmonary effort is normal.     Breath sounds: Normal breath sounds.  Musculoskeletal:     Cervical back: Neck supple.  Neurological:     General: No focal deficit present.     Mental Status: She is alert.  Psychiatric:        Mood and Affect: Mood normal.        Behavior: Behavior normal.    ------------------------------------------------------------------------------------------------------------------------------------------------------------------------------------------------------------------- Assessment and Plan  Cystitis Urinalysis completed yesterday shows continued signs of UTI.  Culture still pending.  She has having worsening symptoms of fatigue and malaise.  Given injection of Rocephin  today as she has worsened since starting Bactrim.  She was also swabbed for COVID.  Checking CMP and CBC.  She will continue Bactrim for now until we get culture results back.   Meds ordered this encounter  Medications   cefTRIAXone (ROCEPHIN) injection 1 g    No follow-ups on file.    This visit occurred during the SARS-CoV-2 public health emergency.  Safety protocols were in place, including screening questions prior to the visit, additional usage of staff PPE, and extensive cleaning of exam room while observing appropriate contact time as indicated for disinfecting solutions.

## 2021-12-24 ENCOUNTER — Other Ambulatory Visit: Payer: Self-pay | Admitting: Family Medicine

## 2021-12-24 LAB — COMPLETE METABOLIC PANEL WITH GFR
AG Ratio: 1.3 (calc) (ref 1.0–2.5)
ALT: 88 U/L — ABNORMAL HIGH (ref 6–29)
AST: 59 U/L — ABNORMAL HIGH (ref 10–35)
Albumin: 3.7 g/dL (ref 3.6–5.1)
Alkaline phosphatase (APISO): 118 U/L (ref 37–153)
BUN/Creatinine Ratio: 10 (calc) (ref 6–22)
BUN: 11 mg/dL (ref 7–25)
CO2: 26 mmol/L (ref 20–32)
Calcium: 8.6 mg/dL (ref 8.6–10.4)
Chloride: 97 mmol/L — ABNORMAL LOW (ref 98–110)
Creat: 1.07 mg/dL — ABNORMAL HIGH (ref 0.50–1.05)
Globulin: 2.9 g/dL (calc) (ref 1.9–3.7)
Glucose, Bld: 107 mg/dL — ABNORMAL HIGH (ref 65–99)
Potassium: 4.1 mmol/L (ref 3.5–5.3)
Sodium: 136 mmol/L (ref 135–146)
Total Bilirubin: 0.7 mg/dL (ref 0.2–1.2)
Total Protein: 6.6 g/dL (ref 6.1–8.1)
eGFR: 57 mL/min/{1.73_m2} — ABNORMAL LOW (ref >=60)

## 2021-12-24 LAB — CBC WITH DIFFERENTIAL/PLATELET
Absolute Monocytes: 942 cells/uL (ref 200–950)
Basophils Absolute: 54 cells/uL (ref 0–200)
Basophils Relative: 0.5 %
Eosinophils Absolute: 11 cells/uL — ABNORMAL LOW (ref 15–500)
Eosinophils Relative: 0.1 %
HCT: 34.8 % — ABNORMAL LOW (ref 35.0–45.0)
Hemoglobin: 11.6 g/dL — ABNORMAL LOW (ref 11.7–15.5)
Lymphs Abs: 1530 cells/uL (ref 850–3900)
MCH: 28.2 pg (ref 27.0–33.0)
MCHC: 33.3 g/dL (ref 32.0–36.0)
MCV: 84.7 fL (ref 80.0–100.0)
MPV: 12.3 fL (ref 7.5–12.5)
Monocytes Relative: 8.8 %
Neutro Abs: 8164 cells/uL — ABNORMAL HIGH (ref 1500–7800)
Neutrophils Relative %: 76.3 %
Platelets: 261 10*3/uL (ref 140–400)
RBC: 4.11 10*6/uL (ref 3.80–5.10)
RDW: 13.4 % (ref 11.0–15.0)
Total Lymphocyte: 14.3 %
WBC: 10.7 10*3/uL (ref 3.8–10.8)

## 2021-12-24 LAB — URINALYSIS
Bilirubin Urine: NEGATIVE
Glucose, UA: NEGATIVE
Nitrite: POSITIVE — AB
Specific Gravity, Urine: 1.012 (ref 1.001–1.035)
pH: 5.5 (ref 5.0–8.0)

## 2021-12-24 LAB — URINE CULTURE
MICRO NUMBER:: 12846227
SPECIMEN QUALITY:: ADEQUATE

## 2021-12-24 LAB — NOVEL CORONAVIRUS, NAA: SARS-CoV-2, NAA: NOT DETECTED

## 2021-12-24 LAB — SARS-COV-2, NAA 2 DAY TAT

## 2021-12-24 MED ORDER — AMOXICILLIN-POT CLAVULANATE 875-125 MG PO TABS: 1.0000 | ORAL_TABLET | Freq: Two times a day (BID) | ORAL | 0 refills | Status: DC

## 2021-12-24 NOTE — Progress Notes (Signed)
Forwarding for your review after yesterday's appointment.

## 2021-12-26 ENCOUNTER — Ambulatory Visit (INDEPENDENT_AMBULATORY_CARE_PROVIDER_SITE_OTHER): Payer: Medicare Other | Admitting: Family Medicine

## 2021-12-26 DIAGNOSIS — Z0000 Encounter for general adult medical examination without abnormal findings: Secondary | ICD-10-CM

## 2021-12-26 NOTE — Patient Instructions (Addendum)
[facility] Maintenance Summary and Written Plan of Care  Cindy [name] ,  Thank you for allowing me to perform your Medicare Annual Wellness Visit and for your ongoing commitment to your health.   Health Maintenance & Immunization History Health Maintenance  Topic Date Due   COVID-19 Vaccine (5 - Booster) 01/01/2022 (Originally 10/21/2021)   MAMMOGRAM  07/14/2022   TETANUS/TDAP  04/05/2028   COLONOSCOPY (Pts 45-14yrs Insurance coverage will need to be confirmed)  08/28/2029   Pneumonia Vaccine 81+ Years old  Completed   INFLUENZA VACCINE  Completed   DEXA SCAN  Completed   Hepatitis C Screening  Completed   Zoster Vaccines- Shingrix  Completed   HPV VACCINES  Aged Out   Immunization History  Administered Date(s) Administered   Hepatitis B 10/19/2011   Influenza Split 09/27/2013   Influenza, High Dose Seasonal PF 08/30/2019   Influenza,inj,Quad PF,6+ Mos 09/17/2014, 09/10/2016   Influenza,inj,Quad PF,6-35 Mos 10/11/2015   Influenza-Unspecified 10/07/2017, 08/30/2019, 09/13/2020, 09/04/2021   Moderna Sars-Covid-2 Vaccination 01/12/2020, 01/31/2020, 10/30/2020, 08/26/2021   Pneumococcal Conjugate-13 04/05/2018   Pneumococcal Polysaccharide-23 05/22/2019   Td 04/07/2005, 12/15/2007   Tdap 04/05/2018   Zoster Recombinat (Shingrix) 06/11/2018, 08/30/2018   Zoster, Live 12/15/2007    These are the patient goals that we discussed:  Goals Addressed              This Visit's Progress     Patient Stated (pt-stated)        12/26/2021 AWV Goal: Exercise for General Health  Patient will verbalize understanding of the benefits of increased physical activity: Exercising regularly is important. It will improve your overall fitness, flexibility, and endurance. Regular exercise also will improve your overall health. It can help you control your weight, reduce stress, and improve your bone density. Over the next year, patient will  increase physical activity as tolerated with a goal of at least 150 minutes of moderate physical activity per week.  You can tell that you are exercising at a moderate intensity if your heart starts beating faster and you start breathing faster but can still hold a conversation. Moderate-intensity exercise ideas include: Walking 1 mile (1.6 km) in about 15 minutes Biking Hiking Golfing Dancing Water aerobics Patient will verbalize understanding of everyday activities that increase physical activity by providing examples like the following: Yard work, such as: Sales promotion account executive Gardening Washing windows or floors Patient will be able to explain general safety guidelines for exercising:  Before you start a new exercise program, talk with your health care provider. Do not exercise so much that you hurt yourself, feel dizzy, or get very short of breath. Wear comfortable clothes and wear shoes with good support. Drink plenty of water while you exercise to prevent dehydration or heat stroke. Work out until your breathing and your heartbeat get faster.         This is a list of Health Maintenance Items that are overdue or due now: There are no preventive care reminders to display for this patient.   Orders/Referrals Placed Today: No orders of the defined types were placed in this encounter.  (Contact our referral department at [phone] if you have not spoken with someone about your referral appointment within the next 5 days)    Follow-up Plan Follow-up with Cindy Nutting, DO as planned Medicare wellness visit in one year.  AVS printed and mailed to the patient.  Health Maintenance, Female Adopting a healthy lifestyle and getting preventive care are important in promoting health and wellness. Ask your health care provider about: The right schedule for you to have regular tests and  exams. Things you can do on your own to prevent diseases and keep yourself healthy. What should I know about diet, weight, and exercise? Eat a healthy diet  Eat a diet that includes plenty of vegetables, fruits, low-fat dairy products, and lean protein. Do not eat a lot of foods that are high in solid fats, added sugars, or sodium. Maintain a healthy weight Body mass index (BMI) is used to identify weight problems. It estimates body fat based on height and weight. Your health care provider can help determine your BMI and help you achieve or maintain a healthy weight. Get regular exercise Get regular exercise. This is one of the most important things you can do for your health. Most adults should: Exercise for at least 150 minutes each week. The exercise should increase your heart rate and make you sweat (moderate-intensity exercise). Do strengthening exercises at least twice a week. This is in addition to the moderate-intensity exercise. Spend less time sitting. Even light physical activity can be beneficial. Watch cholesterol and blood lipids Have your blood tested for lipids and cholesterol at 69 years of age, then have this test every 5 years. Have your cholesterol levels checked more often if: Your lipid or cholesterol levels are high. You are older than 69 years of age. You are at high risk for heart disease. What should I know about cancer screening? Depending on your health history and family history, you may need to have cancer screening at various ages. This may include screening for: Breast cancer. Cervical cancer. Colorectal cancer. Skin cancer. Lung cancer. What should I know about heart disease, diabetes, and high blood pressure? Blood pressure and heart disease High blood pressure causes heart disease and increases the risk of stroke. This is more likely to develop in people who have high blood pressure readings or are overweight. Have your blood pressure checked: Every  3-5 years if you are 2-75 years of age. Every year if you are 83 years old or older. Diabetes Have regular diabetes screenings. This checks your fasting blood sugar level. Have the screening done: Once every three years after age 78 if you are at a normal weight and have a low risk for diabetes. More often and at a younger age if you are overweight or have a high risk for diabetes. What should I know about preventing infection? Hepatitis B If you have a higher risk for hepatitis B, you should be screened for this virus. Talk with your health care provider to find out if you are at risk for hepatitis B infection. Hepatitis C Testing is recommended for: Everyone born from [birth year] through 1963. Anyone with known risk factors for hepatitis C. Sexually transmitted infections (STIs) Get screened for STIs, including gonorrhea and chlamydia, if: You are sexually active and are younger than 69 years of age. You are older than 69 years of age and your health care provider tells you that you are at risk for this type of infection. Your sexual activity has changed since you were last screened, and you are at increased risk for chlamydia or gonorrhea. Ask your health care provider if you are at risk. Ask your health care provider about whether you are at high risk for HIV. Your health care provider may recommend a prescription medicine to help prevent  HIV infection. If you choose to take medicine to prevent HIV, you should first get tested for HIV. You should then be tested every 3 months for as long as you are taking the medicine. Pregnancy If you are about to stop having your period (premenopausal) and you may become pregnant, seek counseling before you get pregnant. Take 400 to 800 micrograms (mcg) of folic acid every day if you become pregnant. Ask for birth control (contraception) if you want to prevent pregnancy. Osteoporosis and menopause Osteoporosis is a disease in which the bones lose minerals and  strength with aging. This can result in bone fractures. If you are 38 years old or older, or if you are at risk for osteoporosis and fractures, ask your health care provider if you should: Be screened for bone loss. Take a calcium or vitamin D supplement to lower your risk of fractures. Be given hormone replacement therapy (HRT) to treat symptoms of menopause. Follow these instructions at home: Alcohol use Do not drink alcohol if: Your health care provider tells you not to drink. You are pregnant, may be pregnant, or are planning to become pregnant. If you drink alcohol: Limit how much you have to: 0-1 drink a day. Know how much alcohol is in your drink. In the [place] one drink equals one 12 oz bottle of beer (355 mL), one 5 oz glass of wine (148 mL), or one 1 oz glass of hard liquor (44 mL). Lifestyle Do not use any products that contain nicotine or tobacco. These products include cigarettes, chewing tobacco, and vaping devices, such as e-cigarettes. If you need help quitting, ask your health care provider. Do not use street drugs. Do not share needles. Ask your health care provider for help if you need support or information about quitting drugs. General instructions Schedule regular health, dental, and eye exams. Stay current with your vaccines. Tell your health care provider if: You often feel depressed. You have ever been abused or do not feel safe at home. Summary Adopting a healthy lifestyle and getting preventive care are important in promoting health and wellness. Follow your health care provider's instructions about healthy diet, exercising, and getting tested or screened for diseases. Follow your health care provider's instructions on monitoring your cholesterol and blood pressure. This information is not intended to replace advice given to you by your health care provider. Make sure you discuss any questions you have with your health care provider. Document Revised:  04/21/2021 Document Reviewed: 04/21/2021 Elsevier Patient Education  [place].

## 2021-12-26 NOTE — Progress Notes (Signed)
MEDICARE ANNUAL WELLNESS VISIT  12/26/2021  Telephone Visit Disclaimer This Medicare AWV was conducted by telephone due to national recommendations for restrictions regarding the COVID-19 Pandemic (e.g. social distancing).  I verified, using two identifiers, that I am speaking with Cindy [name] or their authorized healthcare agent. I discussed the limitations, risks, security, and privacy concerns of performing an evaluation and management service by telephone and the potential availability of an in-person appointment in the future. The patient expressed understanding and agreed to proceed.  Location of Patient: Home Location of Provider (nurse):  In the office.  Subjective:    Cindy [name] is a 71 y.o. female patient of Cindy Nutting, DO who had a Medicare Annual Wellness Visit today via telephone. Menaal is Retired and lives with their spouse. she has 0 children. she reports that she is socially active and does interact with friends/family regularly. she is minimally physically active and enjoys gardening and singing in the choir.  Patient Care Team: Cindy Nutting, DO as PCP - General (Family Medicine)  Advanced Directives 12/26/2021 03/26/2019 04/17/2016  Does Patient Have a Medical Advance Directive? Yes Yes Yes  Type of Advance Directive Living will;Healthcare Power of [place];Living will Living will;Healthcare Power of Attorney  Does patient want to make changes to medical advance directive? No - Patient declined No - Patient declined -  Copy of [place] in Chart? No - copy requested No - copy requested No - copy requested    Hospital Utilization Over the Past 12 Months: # of hospitalizations or ER visits: 0 # of surgeries: 0  Review of Systems    Patient reports that her overall health is worse compared to last year.  History obtained from chart review and the patient  Patient Reported Readings (BP, Pulse, CBG, Weight,  etc) none  Pain Assessment Pain : No/denies pain     Current Medications & Allergies (verified) Allergies as of 12/26/2021   No Known Allergies      Medication List        Accurate as of December 26, 2021  9:14 AM. If you have any questions, ask your nurse or doctor.          acyclovir cream 5 % Commonly known as: ZOVIRAX Apply 1 application topically every 3 (three) hours. What changed:  when to take this reasons to take this   amoxicillin-clavulanate 875-125 MG tablet Commonly known as: AUGMENTIN Take 1 tablet by mouth 2 (two) times daily.   buPROPion 150 MG 24 hr tablet Commonly known as: WELLBUTRIN XL TAKE 3 TABLETS EVERY DAY   CALCIUM 1200 PO Take 1 tablet by mouth daily.   clonazePAM 1 MG tablet Commonly known as: KLONOPIN TAKE 1 TABLET TWICE DAILY   dicyclomine 20 MG tablet Commonly known as: BENTYL TAKE 1 TABLET THREE TIMES DAILY AS NEEDED   docusate sodium 100 MG capsule Commonly known as: COLACE Take 100 mg by mouth at bedtime.   lovastatin 20 MG tablet Commonly known as: MEVACOR TAKE 1 TABLET AT BEDTIME   multivitamin tablet Take 1 tablet by mouth daily.   omeprazole 40 MG capsule Commonly known as: PRILOSEC TAKE 1 CAPSULE EVERY DAY   PARoxetine 20 MG tablet Commonly known as: PAXIL Take 2 tablets (40 mg total) by mouth daily for 90 doses.   polyethylene glycol 17 g packet Commonly known as: MIRALAX / GLYCOLAX Take 17 g by mouth as needed.   promethazine 25 MG tablet Commonly  known as: PHENERGAN Take 1 tablet (25 mg total) by mouth every 8 (eight) hours as needed for nausea or vomiting.   promethazine 25 MG suppository Commonly known as: PHENERGAN Place 1 suppository (25 mg total) rectally every 6 (six) hours as needed for nausea or vomiting.   triamcinolone cream 0.1 % Commonly known as: KENALOG Apply 1 application topically 2 (two) times daily.   trimethoprim 100 MG tablet Commonly known as: TRIMPEX TAKE 1 TABLET EVERY  DAY   trimethoprim-polymyxin b ophthalmic solution Commonly known as: Polytrim 1 drop every 4 hours to affected eye.   valACYclovir 500 MG tablet Commonly known as: VALTREX TAKE 1 TABLET EVERY DAY   ZINC 15 PO Take by mouth.        History (reviewed): Past Medical History:  Diagnosis Date   Arthritis    Cancer ([place])    breast   Depression    GERD (gastroesophageal reflux disease)    Hyperlipidemia    Tendonitis    Past Surgical History:  Procedure Laterality Date   BREAST SURGERY     right   ELBOW SURGERY     right   Family History  Problem Relation Age of Onset   Cancer Mother        bone   Hypertension Father    Diabetes Father    Stroke Father    Hypertension Sister    Alzheimer's disease Brother    Hypertension Sister    Colon cancer Neg Hx    Esophageal cancer Neg Hx    Rectal cancer Neg Hx    Stomach cancer Neg Hx    Social History   Socioeconomic History   Marital status: Married    Spouse name: [name]   Number of children: 0   Years of education: 18   Highest education level: Master's degree (e.g., MA, MS, MEng, MEd, MSW, MBA)  Occupational History   Occupation: Retired.  Tobacco Use   Smoking status: Never   Smokeless tobacco: Never  Vaping Use   Vaping Use: Never used  Substance and Sexual Activity   Alcohol use: Yes    Comment: rarely   Drug use: No   Sexual activity: Yes    Birth control/protection: Post-menopausal  Other Topics Concern   Not on file  Social History Narrative   Lives with her husband. They help take care of her brother's grandchildren. She enjoys singing in the choir and gardening.   Social Determinants of Health   Financial Resource Strain: Low Risk    Difficulty of Paying Living Expenses: Not hard at all  Food Insecurity: No Food Insecurity   Worried About Charity fundraiser in the Last Year: Never true   [place] in the Last Year: Never true  Transportation Needs: No Transportation Needs   Lack of  Transportation (Medical): No   Lack of Transportation (Non-Medical): No  Physical Activity: Inactive   Days of Exercise per Week: 0 days   Minutes of Exercise per Session: 0 min  Stress: Not on file  Social Connections: Socially Integrated   Frequency of Communication with Friends and Family: Twice a week   Frequency of Social Gatherings with Friends and Family: Once a week   Attends Religious Services: More than 4 times per year   Active Member of [employer] or Organizations: Yes   Attends Music therapist: More than 4 times per year   Marital Status: Married    Activities of Daily Living In your present  state of health, do you have any difficulty performing the following activities: 12/26/2021 11/14/2021  Hearing? N N  Vision? N N  Difficulty concentrating or making decisions? N N  Walking or climbing stairs? N N  Dressing or bathing? N N  Doing errands, shopping? N N  Preparing Food and eating ? N -  Using the Toilet? N -  In the past six months, have you accidently leaked urine? N -  Do you have problems with loss of bowel control? N -  Managing your Medications? N -  Managing your Finances? N -  Housekeeping or managing your Housekeeping? N -  Some recent data might be hidden    Patient Education/ Literacy How often do you need to have someone help you when you read instructions, pamphlets, or other written materials from your doctor or pharmacy?: 1 - Never What is the last grade level you completed in school?: 2 Master's degrees.  Exercise Current Exercise Habits: The patient does not participate in regular exercise at present, Exercise limited by: None identified  Diet Patient reports consuming 3 meals a day and 1-2 snack(s) a day Patient reports that her primary diet is: Regular Patient reports that she does have regular access to food.   Depression Screen PHQ 2/9 Scores 12/26/2021 12/02/2021 12/02/2021 05/23/2021 11/22/2020 05/23/2020 05/22/2019  PHQ - 2 Score  0 2 2 0 3 0 2  PHQ- 9 Score 0 10 - 0 9 0 6     Fall Risk Fall Risk  12/26/2021 11/14/2021 05/23/2021 05/22/2019 03/30/2017  Falls in the past year? 0 0 0 0 Yes  Number falls in past yr: 0 0 0 0 1  Injury with Fall? 0 0 0 0 No  Risk for fall due to : No Fall Risks - No Fall Risks - -  Follow up Falls evaluation completed Falls evaluation completed Falls evaluation completed Falls evaluation completed -     Objective:  ITZY ADLER seemed alert and oriented and she participated appropriately during our telephone visit.  Blood Pressure Weight BMI  BP Readings from Last 3 Encounters:  12/23/21 116/64  12/02/21 108/63  11/26/21 130/74   Wt Readings from Last 3 Encounters:  12/23/21 162 lb (73.5 kg)  12/02/21 169 lb (76.7 kg)  11/26/21 172 lb (78 kg)   BMI Readings from Last 1 Encounters:  12/23/21 24.63 kg/m    *Unable to obtain current vital signs, weight, and BMI due to telephone visit type  Hearing/Vision  Izora Gala did not seem to have difficulty with hearing/understanding during the telephone conversation Reports that she has had a formal eye exam by an eye care professional within the past year Reports that she has not had a formal hearing evaluation within the past year *Unable to fully assess hearing and vision during telephone visit type  Cognitive Function: 6CIT Screen 12/26/2021  What Year? 0 points  What month? 0 points  What time? 0 points  Count back from 20 0 points  Months in reverse 0 points  Repeat phrase 0 points  Total Score 0   (Normal:0-7, Significant for Dysfunction: >8)  Normal Cognitive Function Screening: Yes   Immunization & Health Maintenance Record Immunization History  Administered Date(s) Administered   Hepatitis B 10/19/2011   Influenza Split 09/27/2013   Influenza, High Dose Seasonal PF 08/30/2019   Influenza,inj,Quad PF,6+ Mos 09/17/2014, 09/10/2016   Influenza,inj,Quad PF,6-35 Mos 10/11/2015   Influenza-Unspecified 10/07/2017,  08/30/2019, 09/13/2020, 09/04/2021   Moderna Sars-Covid-2 Vaccination 01/12/2020, 01/31/2020, 10/30/2020,  08/26/2021   Pneumococcal Conjugate-13 04/05/2018   Pneumococcal Polysaccharide-23 05/22/2019   Td 04/07/2005, 12/15/2007   Tdap 04/05/2018   Zoster Recombinat (Shingrix) 06/11/2018, 08/30/2018   Zoster, Live 12/15/2007    Health Maintenance  Topic Date Due   COVID-19 Vaccine (5 - Booster) 01/01/2022 (Originally 10/21/2021)   MAMMOGRAM  07/14/2022   TETANUS/TDAP  04/05/2028   COLONOSCOPY (Pts 45-39yrs Insurance coverage will need to be confirmed)  08/28/2029   Pneumonia Vaccine 77+ Years old  Completed   INFLUENZA VACCINE  Completed   DEXA SCAN  Completed   Hepatitis C Screening  Completed   Zoster Vaccines- Shingrix  Completed   HPV VACCINES  Aged Out       Assessment  This is a routine wellness examination for [employer].  Health Maintenance: Due or Overdue There are no preventive care reminders to display for this patient.  Cindy [name] does not need a referral for Community Assistance: Care Management:   no Social Work:    no Prescription Assistance:  no Nutrition/Diabetes Education:  no   Plan:  Personalized Goals  Goals Addressed               This Visit's Progress     Patient Stated (pt-stated)        12/26/2021 AWV Goal: Exercise for General Health  Patient will verbalize understanding of the benefits of increased physical activity: Exercising regularly is important. It will improve your overall fitness, flexibility, and endurance. Regular exercise also will improve your overall health. It can help you control your weight, reduce stress, and improve your bone density. Over the next year, patient will increase physical activity as tolerated with a goal of at least 150 minutes of moderate physical activity per week.  You can tell that you are exercising at a moderate intensity if your heart starts beating faster and you start breathing faster but  can still hold a conversation. Moderate-intensity exercise ideas include: Walking 1 mile (1.6 km) in about 15 minutes Biking Hiking Golfing Dancing Water aerobics Patient will verbalize understanding of everyday activities that increase physical activity by providing examples like the following: Yard work, such as: Sales promotion account executive Gardening Washing windows or floors Patient will be able to explain general safety guidelines for exercising:  Before you start a new exercise program, talk with your health care provider. Do not exercise so much that you hurt yourself, feel dizzy, or get very short of breath. Wear comfortable clothes and wear shoes with good support. Drink plenty of water while you exercise to prevent dehydration or heat stroke. Work out until your breathing and your heartbeat get faster.        Personalized Health Maintenance & Screening Recommendations  There are no preventive care reminders to display for this patient.   Lung Cancer Screening Recommended: no (Low Dose CT Chest recommended if Age 83-80 years, 30 pack-year currently smoking OR have quit w/in past 15 years) Hepatitis C Screening recommended: no HIV Screening recommended: no  Advanced Directives: Written information was not prepared per patient's request.  Referrals & Orders No orders of the defined types were placed in this encounter.   Follow-up Plan Follow-up with Cindy Nutting, DO as planned Medicare wellness visit in one year.  AVS printed and mailed to the patient.   I have personally reviewed and noted the following in the patients chart:   Medical and social history Use  of alcohol, tobacco or illicit drugs  Current medications and supplements Functional ability and status Nutritional status Physical activity Advanced directives List of other physicians Hospitalizations, surgeries, and ER visits  in previous 12 months Vitals Screenings to include cognitive, depression, and falls Referrals and appointments  In addition, I have reviewed and discussed with Cindy [name] certain preventive protocols, quality metrics, and best practice recommendations. A written personalized care plan for preventive services as well as general preventive health recommendations is available and can be mailed to the patient at her request.      Tinnie Gens, RN  12/26/2021

## 2022-01-10 ENCOUNTER — Encounter: Payer: Self-pay | Admitting: Family Medicine

## 2022-01-12 ENCOUNTER — Other Ambulatory Visit: Payer: Self-pay

## 2022-01-12 ENCOUNTER — Ambulatory Visit (INDEPENDENT_AMBULATORY_CARE_PROVIDER_SITE_OTHER): Payer: Medicare Other | Admitting: Physician Assistant

## 2022-01-12 ENCOUNTER — Other Ambulatory Visit: Payer: Self-pay | Admitting: Family Medicine

## 2022-01-12 ENCOUNTER — Encounter: Payer: Self-pay | Admitting: Physician Assistant

## 2022-01-12 VITALS — BP 134/78 | HR 88 | Ht 68.0 in | Wt 162.0 lb

## 2022-01-12 DIAGNOSIS — N907 Vulvar cyst: Secondary | ICD-10-CM

## 2022-01-12 DIAGNOSIS — U071 COVID-19: Secondary | ICD-10-CM | POA: Diagnosis not present

## 2022-01-12 MED ORDER — TRIAMCINOLONE ACETONIDE 0.1 % EX CREA: 1.0000 "application " | TOPICAL_CREAM | Freq: Two times a day (BID) | CUTANEOUS | 0 refills | Status: DC

## 2022-01-12 NOTE — Patient Instructions (Signed)
Epidermoid Cyst An epidermoid cyst, also called an epidermal cyst, is a small lump under your skin. The cyst contains a substance called keratin. Do not try to pop or open the cyst yourself. What are the causes? A blocked hair follicle. A hair that curls and re-enters the skin instead of growing straight out of the skin. A blocked pore. Irritated skin. An injury to the skin. Certain conditions that are passed along from parent to child. Human papillomavirus (HPV). This happens rarely when cysts occur on the bottom of the feet. Long-term sun damage to the skin. What increases the risk? Having acne. Being female. Having an injury to the skin. Being past puberty. Having certain conditions caused by genes (genetic disorder) What are the signs or symptoms? These cysts are usually harmless, but they can get infected. Symptoms of infection may include: Redness. Inflammation. Tenderness. Warmth. Fever. A bad-smelling substance that drains from the cyst. Pus that drains from the cyst. How is this treated? In many cases, epidermoid cysts go away on their own without treatment. If a cyst becomes infected, treatment may include: Opening and draining the cyst, done by a doctor. After draining, you may need minor surgery to remove the rest of the cyst. Antibiotic medicine. Shots of medicines (steroids) that help to reduce inflammation. Surgery to remove the cyst. Surgery may be done if the cyst: Becomes large. Bothers you. Has a chance of turning into cancer. Do not try to open a cyst yourself. Follow these instructions at home: Medicines Take over-the-counter and prescription medicines as told by your doctor. If you were prescribed an antibiotic medicine, take it as told by your doctor. Do not stop taking it even if you start to feel better. General instructions Keep the area around your cyst clean and dry. Wear loose, dry clothing. Avoid touching your cyst. Check your cyst every day for  signs of infection. Check for: Redness, swelling, or pain. Fluid or blood. Warmth. Pus or a bad smell. Keep all follow-up visits. How is this prevented? Wear clean, dry, clothing. Avoid wearing tight clothing. Keep your skin clean and dry. Take showers or baths every day. Contact a doctor if: Your cyst has symptoms of infection. Your condition does not improve or gets worse. You have a cyst that looks different from other cysts you have had. You have a fever. Get help right away if: Redness spreads from the cyst into the area close by. Summary An epidermoid cyst is a small lump under your skin. If a cyst becomes infected, treatment may include surgery to open and drain the cyst, or to remove it. Take over-the-counter and prescription medicines only as told by your doctor. Contact a doctor if your condition is not improving or is getting worse. Keep all follow-up visits. This information is not intended to replace advice given to you by your health care provider. Make sure you discuss any questions you have with your health care provider. Document Revised: 03/06/2020 Document Reviewed: 03/06/2020 Elsevier Patient Education  [place].

## 2022-01-12 NOTE — Telephone Encounter (Signed)
I called and left a message stating I have scheduled patient for this afternoon.

## 2022-01-12 NOTE — Telephone Encounter (Signed)
Patient scheduled.

## 2022-01-12 NOTE — Progress Notes (Signed)
°  Subjective:     Patient ID: Cindy [name], female   DOB: 20-Apr-1951, 71 y.o.   MRN: [id number]  HPI 71 YO female presents to the clinic complaining of a bump on her left labia majora that has been draining green pus. She noticed the bump on Friday. She had recently finished  augmentin for a UTI. Her urinary symptoms have since resolved. Once she finished the augmentin, she noticed that her  labia was swollen, red, and warm to touch. When she touched the area on Friday it drained green pus. She did epsom salt soaks for 3 days and the area continued to drain. The epsom salt bath seems to have made her symptoms [name]. Currently, there is no swelling, erythema, or drainage. No fever or nausea. Never had anything like this before.   Review of Systems  Constitutional:  Negative for chills and fever.  Genitourinary:  Negative for difficulty urinating, dysuria, frequency, hematuria and vaginal discharge.       Discharge from cyst on labia      Objective:   Physical Exam Exam conducted with a chaperone present.  Constitutional:      General: She is not in acute distress.    Appearance: Normal appearance. She is normal weight. She is not ill-appearing, toxic-appearing or diaphoretic.  HENT:     Head: Normocephalic and atraumatic.  Eyes:     Conjunctiva/sclera: Conjunctivae normal.  Genitourinary:    Pubic Area: No rash or pubic lice.     Musculoskeletal:        General: Normal range of motion.  Skin:    General: Skin is warm and dry.  Neurological:     Mental Status: She is alert and oriented to person, place, and time.  Psychiatric:        Mood and Affect: Mood normal.        Behavior: Behavior normal.        Thought Content: Thought content normal.        Judgment: Judgment normal.       Assessment:     Marland KitchenMarland KitchenSalote was seen today for vaginal pain.  Diagnoses and all orders for this visit:  Sebaceous cyst of labia       Plan:      From what as been described seems like  seb cyst. Reassured patient that the area does not look infected. No redness, warmth, discharge, or fevers.   Continue warm sitz baths.   Educated patient that the cyst could become infected again and fill with purulent discharge. If this happens, would consider I&D and referral to general surgery to remove the cyst.   Follow up if symptoms return or worsen, including fever.

## 2022-01-13 ENCOUNTER — Encounter: Payer: Self-pay | Admitting: Physician Assistant

## 2022-01-13 DIAGNOSIS — N907 Vulvar cyst: Secondary | ICD-10-CM | POA: Insufficient documentation

## 2022-01-23 DIAGNOSIS — Z20822 Contact with and (suspected) exposure to covid-19: Secondary | ICD-10-CM | POA: Diagnosis not present

## 2022-01-30 DIAGNOSIS — R8279 Other abnormal findings on microbiological examination of urine: Secondary | ICD-10-CM | POA: Diagnosis not present

## 2022-01-30 DIAGNOSIS — N3000 Acute cystitis without hematuria: Secondary | ICD-10-CM | POA: Diagnosis not present

## 2022-02-10 ENCOUNTER — Encounter: Payer: Self-pay | Admitting: Physician Assistant

## 2022-02-11 ENCOUNTER — Ambulatory Visit (INDEPENDENT_AMBULATORY_CARE_PROVIDER_SITE_OTHER): Payer: Medicare Other | Admitting: Physician Assistant

## 2022-02-11 ENCOUNTER — Other Ambulatory Visit: Payer: Self-pay

## 2022-02-11 ENCOUNTER — Encounter: Payer: Self-pay | Admitting: Physician Assistant

## 2022-02-11 VITALS — BP 126/64 | HR 73 | Ht 68.0 in | Wt 164.0 lb

## 2022-02-11 DIAGNOSIS — N907 Vulvar cyst: Secondary | ICD-10-CM

## 2022-02-11 NOTE — Patient Instructions (Addendum)
Will refer to have removed ?Epidermoid Cyst ?An epidermoid cyst, also known as epidermal cyst, is a sac made of skin tissue. The sac contains a substance called keratin. Keratin is a protein that is normally secreted through the hair follicles. When keratin becomes trapped in the top layer of skin (epidermis), it can form an epidermoid cyst. ?Epidermoid cysts can be found anywhere on your body. These cysts are usually harmless (benign), and they may not cause symptoms unless they become inflamed or infected. ?What are the causes? ?This condition may be caused by: ?A blocked hair follicle. ?A hair that curls and re-enters the skin instead of growing straight out of the skin (ingrown hair). ?A blocked pore. ?Irritated skin. ?An injury to the skin. ?Certain conditions that are passed along from parent to child (inherited). ?Human papillomavirus (HPV). This happens rarely when cysts occur on the bottom of the feet. ?Long-term (chronic) sun damage to the skin. ?What increases the risk? ?The following factors may make you more likely to develop an epidermoid cyst: ?Having acne. ?Being female. ?Having an injury to the skin. ?Being past puberty. ?Having certain rare genetic disorders. ?What are the signs or symptoms? ?The only symptom of this condition may be a small, painless lump underneath the skin. When an epidermal cyst ruptures, it may become inflamed. True infection in cysts is rare. Symptoms may include: ?Redness. ?Inflammation. ?Tenderness. ?Warmth. ?Keratin draining from the cyst. Keratin is grayish-white, bad-smelling substance. ?Pus draining from the cyst. ?How is this diagnosed? ?This condition is diagnosed with a physical exam. ?In some cases, you may have a sample of tissue (biopsy) taken from your cyst to be examined under a microscope or tested for bacteria. ?You may be referred to a health care provider who specializes in skin care (dermatologist). ?How is this treated? ?If a cyst becomes inflamed, treatment  may include: ?Opening and draining the cyst, done by a health care provider. After draining, minor surgery to remove the rest of the cyst may be done. ?Taking antibiotic medicine. ?Having injections of medicines (steroids) that help to reduce inflammation. ?Having surgery to remove the cyst. Surgery may be done if the cyst: ?Becomes large. ?Bothers you. ?Has a chance of turning into cancer. ?Do not try to open a cyst yourself. ?Follow these instructions at home: ?Medicines ?If you were prescribed an antibiotic medicine, take it it as told by your health care provider. Do not stop using the antibiotic even if you start to feel better. ?Take over-the-counter and prescription medicines only as told by your health care provider. ?General instructions ?Keep the area around your cyst clean and dry. ?Wear loose, dry clothing. ?Avoid touching your cyst. ?Check your cyst every day for signs of infection. Check for: ?Redness, swelling, or pain. ?Fluid or blood. ?Warmth. ?Pus or a bad smell. ?Keep all follow-up visits. This is important. ?How is this prevented? ?Wear clean, dry, clothing. ?Avoid wearing tight clothing. ?Keep your skin clean and dry. Take showers or baths every day. ?Contact a health care provider if: ?Your cyst develops symptoms of infection. ?Your condition is not improving or is getting worse. ?You develop a cyst that looks different from other cysts you have had. ?You have a fever. ?Get help right away if: ?Redness spreads from the cyst into the surrounding area. ?Summary ?An epidermoid cyst is a sac made of skin tissue. These cysts are usually harmless (benign), and they may not cause symptoms unless they become inflamed. ?If a cyst becomes inflamed, treatment may include surgery to  open and drain the cyst, or to remove it. Treatment may also include medicines by mouth or through an injection. ?Take over-the-counter and prescription medicines only as told by your health care provider. If you were  prescribed an antibiotic medicine, take it as told by your health care provider. Do not stop using the antibiotic even if you start to feel better. ?Contact a health care provider if your condition is not improving or is getting worse. ?Keep all follow-up visits as told by your health care provider. This is important. ?This information is not intended to replace advice given to you by your health care provider. Make sure you discuss any questions you have with your health care provider. ?Document Revised: 03/06/2020 Document Reviewed: 03/06/2020 ?Elsevier Patient Education ? [place]. ? ?

## 2022-02-11 NOTE — Progress Notes (Signed)
? ?  Subjective:  ? ? Patient ID: Cindy [name], female    DOB: 02-26-1951, 71 y.o.   MRN: [id number] ? ?HPI ?Pt is a 71 yo female who presents to the clinic to follow up on sebaceous cyst of labia who was seen on 01/12/2022. She finished the doxycycline and pain resolved and area got much better. The hardness was going away but recently started draining creamy white discharge. No pain but irritating. Warm compresses do help but not going away. She wonders what to do next. No pain, fever, chills today.  ? ?.. ?Active Ambulatory Problems  ?  Diagnosis Date Noted  ? Depression with anxiety 02/01/2009  ? BREAST CANCER, HX OF 08/26/2007  ? Hyperlipidemia 10/20/2012  ? HSV-1 infection 10/20/2012  ? GERD (gastroesophageal reflux disease) 10/20/2012  ? Hypermetropia of both eyes 01/11/2018  ? 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 Nuclear sclerotic cataract of both eyes 01/11/2018  ? PVD (posterior vitreous detachment), right 01/11/2018  ? Cystitis 10/30/2019  ? Urinary frequency 11/16/2019  ? MDD (major depressive disorder), recurrent episode ([name]) 05/23/2020  ? Sebaceous cyst of labia 01/13/2022  ? ?Resolved Ambulatory Problems  ?  Diagnosis Date Noted  ? Migraine 11/19/2009  ? ALLERGIC RHINITIS 08/26/2007  ? Vertigo 04/12/2012  ? Neck pain on right side 10/19/2013  ? Bright red rectal bleeding 01/10/2014  ? Viral syndrome 01/10/2014  ? Fissure, anal 09/17/2014  ? Contusion of scalp 04/21/2016  ? Contusion of right knee 04/21/2016  ? Routine general medical examination at a health care facility 04/05/2018  ? Sinusitis 10/17/2018  ? Colitis, acute 03/29/2019  ? Blepharitis of upper and lower eyelids of both eyes 01/11/2018  ? URI (upper respiratory infection) 09/10/2020  ? IBS (irritable bowel syndrome) 11/24/2020  ? Abscess of groin, right 05/15/2021  ? ?Past Medical History:  ?Diagnosis Date  ? Arthritis   ? Cancer [facility])   ? Depression   ? Tendonitis   ? ? ? ? ?Review of Systems  ?All other systems reviewed  and are negative. ? ?   ?Objective:  ? Physical Exam ?Vitals reviewed.  ?Cardiovascular:  ?   Rate and Rhythm: Normal rate.  ?Pulmonary:  ?   Effort: Pulmonary effort is normal.  ?Genitourinary: ? ? ?Neurological:  ?   Mental Status: She is alert.  ? ? ? ? ? ?   ?Assessment & Plan:  ? ?Marland KitchenIzora Gala was seen today for cyst. ? ?Diagnoses and all orders for this visit: ? ?Sebaceous cyst of labia ?-     Ambulatory referral to Dermatology ? ? ?Not infected today. Continue warm compresses. Needs to be removed so that it will not keep flaring up.  ?Referral placed for dermatology today.  ?

## 2022-02-12 ENCOUNTER — Other Ambulatory Visit: Payer: Self-pay | Admitting: Family Medicine

## 2022-02-18 DIAGNOSIS — L578 Other skin changes due to chronic exposure to nonionizing radiation: Secondary | ICD-10-CM | POA: Diagnosis not present

## 2022-02-18 DIAGNOSIS — L720 Epidermal cyst: Secondary | ICD-10-CM | POA: Diagnosis not present

## 2022-02-23 ENCOUNTER — Other Ambulatory Visit: Payer: Self-pay | Admitting: Family Medicine

## 2022-02-24 DIAGNOSIS — R350 Frequency of micturition: Secondary | ICD-10-CM | POA: Diagnosis not present

## 2022-02-24 DIAGNOSIS — N3000 Acute cystitis without hematuria: Secondary | ICD-10-CM | POA: Diagnosis not present

## 2022-02-27 DIAGNOSIS — L309 Dermatitis, unspecified: Secondary | ICD-10-CM | POA: Diagnosis not present

## 2022-02-27 DIAGNOSIS — L281 Prurigo nodularis: Secondary | ICD-10-CM | POA: Diagnosis not present

## 2022-03-03 DIAGNOSIS — Z20822 Contact with and (suspected) exposure to covid-19: Secondary | ICD-10-CM | POA: Diagnosis not present

## 2022-03-06 ENCOUNTER — Telehealth: Payer: Self-pay

## 2022-03-06 MED ORDER — DOXYCYCLINE HYCLATE 100 MG PO TABS: 100.0000 mg | ORAL_TABLET | Freq: Two times a day (BID) | ORAL | 0 refills | Status: DC

## 2022-03-06 NOTE — Telephone Encounter (Signed)
Cindy [name] called and states she now has a bump on the right side of the labia for the last 3 days. She reports it is draining white and red yesterday. Denies fever, chills or sweats. She wanted to know if an antibiotic would be useful at this point.  ?

## 2022-03-06 NOTE — Telephone Encounter (Signed)
Left message advising of recommendations.  

## 2022-03-09 ENCOUNTER — Telehealth: Payer: Self-pay

## 2022-03-09 NOTE — Telephone Encounter (Signed)
Pt lvm requesting medication or intervention to help with Sebaceous cyst of labia. She states it's inflamed. ? ?Please advise. ?

## 2022-03-09 NOTE — Telephone Encounter (Signed)
Forwarding to [place] as she has seen her the past couple of times for this particular issue.

## 2022-03-10 NOTE — Telephone Encounter (Signed)
Spoke with patient. She did start doxycycline on Friday and area is much better. She saw Dr. Michele Mcalpine with dermatology and he said to see him back if this gets worse. She will call if anything else is needed.  ?

## 2022-03-11 DIAGNOSIS — Z20822 Contact with and (suspected) exposure to covid-19: Secondary | ICD-10-CM | POA: Diagnosis not present

## 2022-03-23 ENCOUNTER — Other Ambulatory Visit: Payer: Self-pay | Admitting: Family Medicine

## 2022-03-29 DIAGNOSIS — R350 Frequency of micturition: Secondary | ICD-10-CM | POA: Diagnosis not present

## 2022-03-29 DIAGNOSIS — R3915 Urgency of urination: Secondary | ICD-10-CM | POA: Diagnosis not present

## 2022-03-29 DIAGNOSIS — N3000 Acute cystitis without hematuria: Secondary | ICD-10-CM | POA: Diagnosis not present

## 2022-03-30 DIAGNOSIS — Z20822 Contact with and (suspected) exposure to covid-19: Secondary | ICD-10-CM | POA: Diagnosis not present

## 2022-04-12 DIAGNOSIS — Z20822 Contact with and (suspected) exposure to covid-19: Secondary | ICD-10-CM | POA: Diagnosis not present

## 2022-04-16 DIAGNOSIS — Z23 Encounter for immunization: Secondary | ICD-10-CM | POA: Diagnosis not present

## 2022-04-18 DIAGNOSIS — R030 Elevated blood-pressure reading, without diagnosis of hypertension: Secondary | ICD-10-CM | POA: Diagnosis not present

## 2022-04-18 DIAGNOSIS — R350 Frequency of micturition: Secondary | ICD-10-CM | POA: Diagnosis not present

## 2022-05-14 ENCOUNTER — Other Ambulatory Visit: Payer: Self-pay | Admitting: Family Medicine

## 2022-05-14 DIAGNOSIS — K529 Noninfective gastroenteritis and colitis, unspecified: Secondary | ICD-10-CM

## 2022-05-30 ENCOUNTER — Other Ambulatory Visit: Payer: Self-pay | Admitting: Family Medicine

## 2022-06-01 ENCOUNTER — Encounter: Payer: Self-pay | Admitting: Family Medicine

## 2022-06-01 ENCOUNTER — Ambulatory Visit (INDEPENDENT_AMBULATORY_CARE_PROVIDER_SITE_OTHER): Payer: Medicare Other | Admitting: Family Medicine

## 2022-06-01 DIAGNOSIS — E7800 Pure hypercholesterolemia, unspecified: Secondary | ICD-10-CM

## 2022-06-01 DIAGNOSIS — F418 Other specified anxiety disorders: Secondary | ICD-10-CM

## 2022-06-01 DIAGNOSIS — N3090 Cystitis, unspecified without hematuria: Secondary | ICD-10-CM | POA: Diagnosis not present

## 2022-06-01 NOTE — Progress Notes (Signed)
Cindy [name] - 71 y.o. female MRN [id number]  Date of birth: Sep 12, 1951  Subjective No chief complaint on file.   HPI Cindy [name] is a 71 y.o. female here today for follow up visit.  She reports that overall she is doing well.  Since her visit with me she has had UTI and labial cyst.  Doing well since treatment of these.   She continues on paxil and bupropion with clonazepam as needed for anxiety.  Tries to limit use of clonazepam.  Overall feels that she is doing well.    Continues on lovastatin for management of HLD.  This is working ok for her and she is not having side effects.    She continues on trimethoprim for management of recurrent UTI.  This is managed by urology.    ROS:  A comprehensive ROS was completed and negative except as noted per HPI   No Known Allergies  Past Medical History:  Diagnosis Date   Arthritis    Cancer ([place])    breast   Depression    GERD (gastroesophageal reflux disease)    Hyperlipidemia    Tendonitis     Past Surgical History:  Procedure Laterality Date   BREAST SURGERY     right   ELBOW SURGERY     right    Social History   Socioeconomic History   Marital status: Married    Spouse name: [name]   Number of children: 0   Years of education: 18   Highest education level: Master's degree (e.g., MA, MS, MEng, MEd, MSW, MBA)  Occupational History   Occupation: Retired.  Tobacco Use   Smoking status: Never   Smokeless tobacco: Never  Vaping Use   Vaping Use: Never used  Substance and Sexual Activity   Alcohol use: Yes    Comment: rarely   Drug use: No   Sexual activity: Yes    Birth control/protection: Post-menopausal  Other Topics Concern   Not on file  Social History Narrative   Lives with her husband. They help take care of her brother's grandchildren. She enjoys singing in the choir and gardening.   Social Determinants of Health   Financial Resource Strain: Low Risk  (12/26/2021)   Overall Financial Resource  Strain (CARDIA)    Difficulty of Paying Living Expenses: Not hard at all  Food Insecurity: No Food Insecurity (12/26/2021)   Hunger Vital Sign    Worried About Running Out of Food in the Last Year: Never true    Ran Out of Food in the Last Year: Never true  Transportation Needs: No Transportation Needs (12/26/2021)   PRAPARE - Hydrologist (Medical): No    Lack of Transportation (Non-Medical): No  Physical Activity: Inactive (12/26/2021)   Exercise Vital Sign    Days of Exercise per Week: 0 days    Minutes of Exercise per Session: 0 min  Stress: No Stress Concern Present (05/22/2019)   [place]    Feeling of Stress : Only a little  Social Connections: Socially Integrated (12/26/2021)   Social Connection and Isolation Panel [NHANES]    Frequency of Communication with Friends and Family: Twice a week    Frequency of Social Gatherings with Friends and Family: Once a week    Attends Religious Services: More than 4 times per year    Active Member of [employer] or Organizations: Yes    Attends Archivist Meetings: More  than 4 times per year    Marital Status: Married    Family History  Problem Relation Age of Onset   Cancer Mother        bone   Hypertension Father    Diabetes Father    Stroke Father    Hypertension Sister    Alzheimer's disease Brother    Hypertension Sister    Colon cancer Neg Hx    Esophageal cancer Neg Hx    Rectal cancer Neg Hx    Stomach cancer Neg Hx     Health Maintenance  Topic Date Due   INFLUENZA VACCINE  07/14/2022   MAMMOGRAM  07/14/2022   COVID-19 Vaccine (6 - Moderna series) 08/23/2022   TETANUS/TDAP  04/05/2028   COLONOSCOPY (Pts 45-12yr Insurance coverage will need to be confirmed)  08/28/2029   Pneumonia Vaccine 71 Years old  Completed   DEXA SCAN  Completed   Hepatitis C Screening  Completed   Zoster Vaccines- Shingrix  Completed   HPV  VACCINES  Aged Out     ----------------------------------------------------------------------------------------------------------------------------------------------------------------------------------------------------------------- Physical Exam BP 122/73 (BP Location: Left Arm, Patient Position: Sitting, Cuff Size: Normal)   Pulse 71   Ht '5\' 8"'$  (1.727 m)   Wt 170 lb (77.1 kg)   SpO2 98%   BMI 25.85 kg/m   Physical Exam Constitutional:      Appearance: Normal appearance.  Eyes:     General: No scleral icterus. Cardiovascular:     Rate and Rhythm: Normal rate and regular rhythm.  Pulmonary:     Effort: Pulmonary effort is normal.     Breath sounds: Normal breath sounds.  Musculoskeletal:     Cervical back: Neck supple.  Neurological:     General: No focal deficit present.     Mental Status: She is alert.  Psychiatric:        Mood and Affect: Mood normal.        Behavior: Behavior normal.     ------------------------------------------------------------------------------------------------------------------------------------------------------------------------------------------------------------------- Assessment and Plan  Depression with anxiety She is doing well with combination of bupropion and paxil.  Recommend continuation of these at current strength.  Additionally, she may continue clonazepam as needed for severe anxiety.    Hyperlipidemia Continues to do well with lovastatin.  Cholesterol is fairly well controlled.   Cystitis Recurrent issue, managed by urology.  Currently on trimethoprim for prevention.     No orders of the defined types were placed in this encounter.   No follow-ups on file.    This visit occurred during the SARS-CoV-2 public health emergency.  Safety protocols were in place, including screening questions prior to the visit, additional usage of staff PPE, and extensive cleaning of exam room while observing appropriate contact time as  indicated for disinfecting solutions.

## 2022-06-01 NOTE — Assessment & Plan Note (Signed)
Continues to do well with lovastatin.  Cholesterol is fairly well controlled.

## 2022-06-01 NOTE — Assessment & Plan Note (Signed)
Recurrent issue, managed by urology.  Currently on trimethoprim for prevention.

## 2022-06-01 NOTE — Assessment & Plan Note (Signed)
She is doing well with combination of bupropion and paxil.  Recommend continuation of these at current strength.  Additionally, she may continue clonazepam as needed for severe anxiety.

## 2022-06-02 ENCOUNTER — Ambulatory Visit: Payer: Medicare Other | Admitting: Family Medicine

## 2022-06-11 ENCOUNTER — Encounter (INDEPENDENT_AMBULATORY_CARE_PROVIDER_SITE_OTHER): Payer: Medicare Other | Admitting: Family Medicine

## 2022-06-11 DIAGNOSIS — L814 Other melanin hyperpigmentation: Secondary | ICD-10-CM | POA: Diagnosis not present

## 2022-06-11 DIAGNOSIS — B009 Herpesviral infection, unspecified: Secondary | ICD-10-CM

## 2022-06-11 DIAGNOSIS — L280 Lichen simplex chronicus: Secondary | ICD-10-CM | POA: Diagnosis not present

## 2022-06-11 DIAGNOSIS — D1801 Hemangioma of skin and subcutaneous tissue: Secondary | ICD-10-CM | POA: Diagnosis not present

## 2022-06-11 DIAGNOSIS — D225 Melanocytic nevi of trunk: Secondary | ICD-10-CM | POA: Diagnosis not present

## 2022-06-11 DIAGNOSIS — L570 Actinic keratosis: Secondary | ICD-10-CM | POA: Diagnosis not present

## 2022-06-11 DIAGNOSIS — L821 Other seborrheic keratosis: Secondary | ICD-10-CM | POA: Diagnosis not present

## 2022-06-11 MED ORDER — VALACYCLOVIR HCL 1 G PO TABS: 1000.0000 mg | ORAL_TABLET | Freq: Three times a day (TID) | ORAL | 0 refills | Status: AC

## 2022-06-11 NOTE — Telephone Encounter (Signed)
I spent 5 total minutes of online digital evaluation and management services in this patient-initiated request for online care. 

## 2022-06-22 DIAGNOSIS — H52203 Unspecified astigmatism, bilateral: Secondary | ICD-10-CM | POA: Diagnosis not present

## 2022-06-22 DIAGNOSIS — H5203 Hypermetropia, bilateral: Secondary | ICD-10-CM | POA: Diagnosis not present

## 2022-06-22 DIAGNOSIS — H2513 Age-related nuclear cataract, bilateral: Secondary | ICD-10-CM | POA: Diagnosis not present

## 2022-06-22 DIAGNOSIS — H0288A Meibomian gland dysfunction right eye, upper and lower eyelids: Secondary | ICD-10-CM | POA: Diagnosis not present

## 2022-06-22 DIAGNOSIS — H11153 Pinguecula, bilateral: Secondary | ICD-10-CM | POA: Diagnosis not present

## 2022-06-22 DIAGNOSIS — H18413 Arcus senilis, bilateral: Secondary | ICD-10-CM | POA: Diagnosis not present

## 2022-06-22 DIAGNOSIS — H0288B Meibomian gland dysfunction left eye, upper and lower eyelids: Secondary | ICD-10-CM | POA: Diagnosis not present

## 2022-06-22 DIAGNOSIS — H02831 Dermatochalasis of right upper eyelid: Secondary | ICD-10-CM | POA: Diagnosis not present

## 2022-06-22 DIAGNOSIS — H0100A Unspecified blepharitis right eye, upper and lower eyelids: Secondary | ICD-10-CM | POA: Diagnosis not present

## 2022-06-22 DIAGNOSIS — H43813 Vitreous degeneration, bilateral: Secondary | ICD-10-CM | POA: Diagnosis not present

## 2022-06-22 DIAGNOSIS — H02834 Dermatochalasis of left upper eyelid: Secondary | ICD-10-CM | POA: Diagnosis not present

## 2022-06-22 DIAGNOSIS — H524 Presbyopia: Secondary | ICD-10-CM | POA: Diagnosis not present

## 2022-07-04 ENCOUNTER — Encounter: Payer: Self-pay | Admitting: Family Medicine

## 2022-07-04 DIAGNOSIS — R4189 Other symptoms and signs involving cognitive functions and awareness: Secondary | ICD-10-CM

## 2022-07-15 ENCOUNTER — Encounter: Payer: Self-pay | Admitting: Family Medicine

## 2022-07-15 DIAGNOSIS — Z1231 Encounter for screening mammogram for malignant neoplasm of breast: Secondary | ICD-10-CM | POA: Diagnosis not present

## 2022-07-15 LAB — HM MAMMOGRAPHY

## 2022-07-27 ENCOUNTER — Other Ambulatory Visit: Payer: Self-pay | Admitting: Family Medicine

## 2022-08-09 ENCOUNTER — Other Ambulatory Visit: Payer: Self-pay | Admitting: Family Medicine

## 2022-08-10 ENCOUNTER — Telehealth: Payer: Self-pay | Admitting: Neurology

## 2022-08-10 ENCOUNTER — Encounter: Payer: Self-pay | Admitting: Psychology

## 2022-08-10 ENCOUNTER — Encounter: Payer: Self-pay | Admitting: Neurology

## 2022-08-10 ENCOUNTER — Ambulatory Visit (INDEPENDENT_AMBULATORY_CARE_PROVIDER_SITE_OTHER): Payer: Medicare Other | Admitting: Neurology

## 2022-08-10 VITALS — BP 139/62 | HR 75 | Ht 68.0 in | Wt 173.4 lb

## 2022-08-10 DIAGNOSIS — R6889 Other general symptoms and signs: Secondary | ICD-10-CM | POA: Diagnosis not present

## 2022-08-10 DIAGNOSIS — Z820 Family history of epilepsy and other diseases of the nervous system: Secondary | ICD-10-CM | POA: Diagnosis not present

## 2022-08-10 DIAGNOSIS — R419 Unspecified symptoms and signs involving cognitive functions and awareness: Secondary | ICD-10-CM

## 2022-08-10 DIAGNOSIS — R4789 Other speech disturbances: Secondary | ICD-10-CM | POA: Diagnosis not present

## 2022-08-10 NOTE — Patient Instructions (Addendum)
It was so nice to see you again, Cindy [name]! You look well, neuro exam is normal and your memory bedside test was benign.   You have complaints of memory loss: memory loss or changes in cognitive function can have many reasons and does not always mean you have dementia. Conditions that can contribute to subjective or objective memory loss include: depression, stress, poor sleep from insomnia or sleep apnea, dehydration, fluctuation in blood sugar values, thyroid or electrolyte dysfunction and certain vitamin deficiencies. Dementia can be caused by stroke, brain atherosclerosis or brain vascular disease due to vascular risk factors (smoking, high blood pressure, high cholesterol, obesity and uncontrolled diabetes), certain degenerative brain disorders (including Parkinson's disease and Multiple sclerosis) and by Alzheimer's disease or other, more rare and sometimes hereditary causes. We will do some additional testing:   blood work today.  We will do a brain scan, MRI.  We will not start medication as yet.  We will also request a formal cognitive test called neuropsychological evaluation which is done by a licensed neuropsychologist. We will make a referral in that regard.  We will call you with test results and monitor your symptoms. Your memory loss is rather mild at this point, which, of course is reassuring.

## 2022-08-10 NOTE — Progress Notes (Signed)
Subjective:    Patient ID: Cindy [name] is a 71 y.o. female.  HPI    Star Age, MD, PhD [facility] [address], [address] [place], [place] [address]  Dear Dr. Zigmund Daniel,  I saw your patient, Cindy [name], upon your kind request in my neurologic clinic today for initial consultation of her memory loss.  The patient is unaccompanied today.  As you know, [name] is a 71 year old right-handed woman with an underlying medical history of reflux disease, hyperlipidemia, breast cancer, arthritis, anxiety, depression, and borderline overweight state, who reports an approx. 2 year history of cognitive issues including forgetfulness, difficulty with name recall including people she has known for years.  She has had some trouble with concentration and multitasking.  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  She has a family history of Alzheimer's dementia affecting her brother who passed away at the age of 28 or 38.  He was in a long-term facility for about 6 years prior to his passing and his symptoms of memory issues started in his late 31s or early 21s.  She has had recent stressors in the past 2 to 3 years.  She was very close to her great niece who passed away from melanoma.  She had young kids at the time, she has less contact with her 36 young children now after her passing.  She is the youngest of 4 kids altogether.  Her oldest sister is around 79 years old and is in a retirement community, her second sister is 84 years old and has arthritis but no cognitive issues.  She tries to stay active, enjoys singing in the church choir.  She admits that she does not really exercise in any formal way.  She sleeps well, no evidence of snoring or apneas per husband, no significant gasping at night.  She tries to hydrate well with water, does not drink caffeine daily and rare alcohol.  She is a non-smoker.  She has not fallen, no sudden onset  of one-sided weakness or numbness or tingling or droopy face or slurring of speech, no recurrent headaches.  She lives with her husband who had 2 sons, 1 son passed away.  She herself never had any children but has been very close to her nieces and nephew, her brother had 4 children, 1 son and 3 daughters.  Her nephew's daughter was the one she was very close with.  Overall, she comes from a very tight knit family, [place].   Her Past Medical History Is Significant For: Past Medical History:  Diagnosis Date   Arthritis    Cancer ([place])    breast   Depression    GERD (gastroesophageal reflux disease)    Hyperlipidemia    Tendonitis     Her Past Surgical History Is Significant For: Past Surgical History:  Procedure Laterality Date   BREAST SURGERY     right   ELBOW SURGERY     right    Her Family History Is Significant For: Family History  Problem Relation Age of Onset   Cancer Mother        bone   Hypertension Father    Diabetes Father    Stroke Father    Hypertension Sister    Alzheimer's disease Brother    Hypertension Sister    Colon cancer Neg Hx    Esophageal cancer Neg Hx    Rectal  cancer Neg Hx    Stomach cancer Neg Hx     Her Social History Is Significant For: Social History   Socioeconomic History   Marital status: Married    Spouse name: [name]   Number of children: 0   Years of education: 18   Highest education level: Master's degree (e.g., MA, MS, MEng, MEd, MSW, MBA)  Occupational History   Occupation: Retired.  Tobacco Use   Smoking status: Never   Smokeless tobacco: Never  Vaping Use   Vaping Use: Never used  Substance and Sexual Activity   Alcohol use: Yes    Comment: rarely   Drug use: No   Sexual activity: Yes    Birth control/protection: Post-menopausal  Other Topics Concern   Not on file  Social History Narrative   Lives with her husband. They help take care of her brother's grandchildren. She enjoys singing in the  choir and gardening.   Social Determinants of Health   Financial Resource Strain: Low Risk  (12/26/2021)   Overall Financial Resource Strain (CARDIA)    Difficulty of Paying Living Expenses: Not hard at all  Food Insecurity: No Food Insecurity (12/26/2021)   Hunger Vital Sign    Worried About Running Out of Food in the Last Year: Never true    Ran Out of Food in the Last Year: Never true  Transportation Needs: No Transportation Needs (12/26/2021)   PRAPARE - Hydrologist (Medical): No    Lack of Transportation (Non-Medical): No  Physical Activity: Inactive (12/26/2021)   Exercise Vital Sign    Days of Exercise per Week: 0 days    Minutes of Exercise per Session: 0 min  Stress: No Stress Concern Present (05/22/2019)   [place]    Feeling of Stress : Only a little  Social Connections: Socially Integrated (12/26/2021)   Social Connection and Isolation Panel [NHANES]    Frequency of Communication with Friends and Family: Twice a week    Frequency of Social Gatherings with Friends and Family: Once a week    Attends Religious Services: More than 4 times per year    Active Member of [employer] or Organizations: Yes    Attends Music therapist: More than 4 times per year    Marital Status: Married    Her Allergies Are:  No Known Allergies:   Her Current Medications Are:  Outpatient Encounter Medications as of 08/10/2022  Medication Sig   acyclovir cream (ZOVIRAX) 5 % Apply 1 application topically every 3 (three) hours. (Patient taking differently: Apply 1 application  topically as needed.)   buPROPion (WELLBUTRIN XL) 150 MG 24 hr tablet TAKE 3 TABLETS EVERY DAY   Calcium Carbonate-Vit D-Min (CALCIUM 1200 PO) Take 1 tablet by mouth daily.   clonazePAM (KLONOPIN) 1 MG tablet TAKE 1 TABLET TWICE DAILY   dicyclomine (BENTYL) 20 MG tablet TAKE 1 TABLET THREE TIMES DAILY AS NEEDED   docusate  sodium (COLACE) 100 MG capsule Take 100 mg by mouth at bedtime.   lovastatin (MEVACOR) 20 MG tablet TAKE 1 TABLET (20 MG TOTAL) BY MOUTH AT BEDTIME.   Multiple Vitamin (MULTIVITAMIN) tablet Take 1 tablet by mouth daily.   omeprazole (PRILOSEC) 40 MG capsule TAKE 1 CAPSULE EVERY DAY   PARoxetine (PAXIL) 20 MG tablet Take 2 tablets (40 mg total) by mouth daily for 90 doses.   polyethylene glycol (MIRALAX / GLYCOLAX) 17 g packet Take 17 g by mouth  as needed.   trimethoprim (TRIMPEX) 100 MG tablet TAKE 1 TABLET EVERY DAY   Zinc Sulfate (ZINC 15 PO) Take by mouth.   promethazine (PHENERGAN) 25 MG suppository Place 1 suppository (25 mg total) rectally every 6 (six) hours as needed for nausea or vomiting. (Patient not taking: Reported on 08/10/2022)   promethazine (PHENERGAN) 25 MG tablet Take 1 tablet (25 mg total) by mouth every 8 (eight) hours as needed for nausea or vomiting. (Patient not taking: Reported on 08/10/2022)   valACYclovir (VALTREX) 500 MG tablet TAKE 1 TABLET EVERY DAY (Patient not taking: Reported on 08/10/2022)   [DISCONTINUED] doxycycline (VIBRA-TABS) 100 MG tablet Take 1 tablet (100 mg total) by mouth 2 (two) times daily.   [DISCONTINUED] trimethoprim-polymyxin b (POLYTRIM) ophthalmic solution 1 drop every 4 hours to affected eye.   No facility-administered encounter medications on file as of 08/10/2022.  :   Review of Systems:  Out of a complete 14 point review of systems, all are reviewed and negative with the exception of these symptoms as listed below:  Review of Systems  Neurological:        Cognitive changes. Over the last year, progressively worse.     Objective:  Neurological Exam  Physical Exam Physical Examination:   Vitals:   08/10/22 0757  BP: 139/62  Pulse: 75    General Examination: The patient is a very pleasant 71 y.o. female in no acute distress. She appears well-developed and well-nourished and well groomed.   HEENT: Normocephalic, atraumatic,  pupils are equal, round and reactive to light, extraocular tracking is good without limitation to gaze excursion or nystagmus noted. Hearing is grossly intact. Face is symmetric with normal facial animation. Speech is clear with no dysarthria noted. There is no hypophonia. There is no lip, neck/head, jaw or voice tremor. Neck is supple with full range of passive and active motion. There are no carotid bruits on auscultation. Oropharynx exam reveals: moderate mouth dryness, good dental hygiene. Tongue protrudes centrally and palate elevates symmetrically.   Chest: Clear to auscultation without wheezing, rhonchi or crackles noted.  Heart: S1+S2+0, regular and normal without murmurs, rubs or gallops noted.   Abdomen: Soft, non-tender and non-distended.  Extremities: There is no pitting edema in the distal lower extremities bilaterally.   Skin: Warm and dry without trophic changes noted.   Musculoskeletal: exam reveals no obvious joint deformities, tenderness or joint swelling or erythema.   Neurologically:  Mental status: The patient is awake, alert and oriented in all 4 spheres. Her immediate and remote memory, attention, language skills and fund of knowledge are appropriate. There is no evidence of aphasia, agnosia, apraxia or anomia. Speech is clear with normal prosody and enunciation. Thought process is linear. Mood is normal and affect is normal.      08/10/2022    8:14 AM  MMSE - Mini Mental State Exam  Orientation to time 5  Orientation to Place 5  Registration 3  Attention/ Calculation 4  Recall 3  Language- name 2 objects 2  Language- repeat 1  Language- follow 3 step command 3  Language- read & follow direction 1  Write a sentence 1  Copy design 1  Total score 29   On 08/10/2022: CDT: 4/4, AFT: 13/min.  Cranial nerves II - XII are as described above under HEENT exam.  Motor exam: Normal bulk, strength and tone is noted. There is no tremor, drift or rebound.  Romberg is  negative. Reflexes are 2+ throughout, toes are downgoing bilaterally.  Fine motor skills and coordination: Normal finger taps, hand movements and rapid alternating padding in the upper extremities, normal foot taps bilaterally.  Cerebellar testing: No dysmetria or intention tremor. There is no truncal or gait ataxia. Normal finger-to-nose and normal heel-to-shin. Sensory exam: intact to light touch in the upper and lower extremities.  Gait, station and balance: She stands easily. No veering to one side is noted. No leaning to one side is noted. Posture is age-appropriate and stance is narrow based. Gait shows normal stride length and normal pace. No problems turning are noted. Tandem walk is initially challenging but doable, better after a few steps.                 Assessment and Plan:   In summary, MARAKI MACQUARRIE is a very pleasant 71 y.o.-year old female with an underlying medical history of reflux disease, hyperlipidemia, breast cancer, arthritis, anxiety, depression, and borderline overweight state, who presents for evaluation of her memory loss.  She reports difficulty with multitasking, name recall and forgetfulness over the past couple of years, more noticeable in the past year.  We talked about different causes of memory loss and different underlying diagnoses.  She does not have a whole lot of vascular risk factors and while she does have a family history of Alzheimer's disease affecting her older brother, she does not have a very strong family history of dementia altogether.  Her MMSE was good today, she lost one-point on serial sevens.  We talked about further evaluation, she is advised to proceed with blood work today, we will check for treatable causes for memory issues.  We will proceed with a brain MRI to rule out a structural cause of her symptoms.  We will also utilize this MRI later on as a baseline for comparison.  In addition, I would like to proceed with a more in-depth and formal  evaluation of her memory with the help of a neuropsychologist.  I explained this referral to her in detail and she is agreeable. We talked about the importance of maintaining a healthy lifestyle in general. I encouraged the patient to eat healthy, exercise on a regular basis, and keep well hydrated, to keep a scheduled bedtime and wake time routine.  She does have a good psychosocial support system.  For now, we will not proceed with any new medications.  She is advised to follow-up after her testing and her referral to neuropsychology.  She is advised that we will keep her posted in the interim of her test results by phone call.  She is largely reassured today.  I answered all her questions today and she was in agreement with our plan. Thank you very much for allowing me to participate in the care of this nice patient. If I can be of any further assistance to you please do not hesitate to call me at [phone].  Sincerely,   Star Age, MD, PhD

## 2022-08-10 NOTE — Telephone Encounter (Signed)
Medicare/bcbs supp no auth req order sent to GI, they will reach out to the patient to schedule.

## 2022-08-11 LAB — CBC WITH DIFFERENTIAL/PLATELET
Basophils Absolute: 0 10*3/uL (ref 0.0–0.2)
Basos: 1 %
EOS (ABSOLUTE): 0.1 10*3/uL (ref 0.0–0.4)
Eos: 2 %
Hematocrit: 40.5 % (ref 34.0–46.6)
Hemoglobin: 13.5 g/dL (ref 11.1–15.9)
Immature Grans (Abs): 0 10*3/uL (ref 0.0–0.1)
Immature Granulocytes: 0 %
Lymphocytes Absolute: 3 10*3/uL (ref 0.7–3.1)
Lymphs: 49 %
MCH: 28.4 pg (ref 26.6–33.0)
MCHC: 33.3 g/dL (ref 31.5–35.7)
MCV: 85 fL (ref 79–97)
Monocytes Absolute: 0.6 10*3/uL (ref 0.1–0.9)
Monocytes: 9 %
Neutrophils Absolute: 2.4 10*3/uL (ref 1.4–7.0)
Neutrophils: 39 %
Platelets: 229 10*3/uL (ref 150–450)
RBC: 4.75 x10E6/uL (ref 3.77–5.28)
RDW: 14.6 % (ref 11.7–15.4)
WBC: 6.1 10*3/uL (ref 3.4–10.8)

## 2022-08-11 LAB — B12 AND FOLATE PANEL
Folate: >20 ng/mL (ref >3.0)
Vitamin B-12: 634 pg/mL (ref 232–1245)

## 2022-08-11 LAB — COMPREHENSIVE METABOLIC PANEL
ALT: 20 IU/L (ref 0–32)
AST: 20 IU/L (ref 0–40)
Albumin/Globulin Ratio: 1.8 (ref 1.2–2.2)
Albumin: 4.6 g/dL (ref 3.9–4.9)
Alkaline Phosphatase: 121 IU/L (ref 44–121)
BUN/Creatinine Ratio: 10 — ABNORMAL LOW (ref 12–28)
BUN: 9 mg/dL (ref 8–27)
Bilirubin Total: 0.3 mg/dL (ref 0.0–1.2)
CO2: 20 mmol/L (ref 20–29)
Calcium: 10 mg/dL (ref 8.7–10.3)
Chloride: 101 mmol/L (ref 96–106)
Creatinine, Ser: 0.91 mg/dL (ref 0.57–1.00)
Globulin, Total: 2.5 g/dL (ref 1.5–4.5)
Glucose: 90 mg/dL (ref 70–99)
Potassium: 4.5 mmol/L (ref 3.5–5.2)
Sodium: 139 mmol/L (ref 134–144)
Total Protein: 7.1 g/dL (ref 6.0–8.5)
eGFR: 68 mL/min/{1.73_m2} (ref >59)

## 2022-08-11 LAB — HGB A1C W/O EAG: Hgb A1c MFr Bld: 5.7 % — ABNORMAL HIGH (ref 4.8–5.6)

## 2022-08-11 LAB — ANA W/REFLEX: Anti Nuclear Antibody (ANA): NEGATIVE

## 2022-08-11 LAB — RPR: RPR Ser Ql: NONREACTIVE

## 2022-08-11 LAB — SEDIMENTATION RATE: Sed Rate: 4 mm/hr (ref 0–40)

## 2022-08-11 LAB — TSH: TSH: 0.971 u[IU]/mL (ref 0.450–4.500)

## 2022-08-11 LAB — C-REACTIVE PROTEIN: CRP: 2 mg/L (ref 0–10)

## 2022-08-13 ENCOUNTER — Other Ambulatory Visit: Payer: Self-pay | Admitting: Family Medicine

## 2022-08-27 ENCOUNTER — Ambulatory Visit
Admission: RE | Admit: 2022-08-27 | Discharge: 2022-08-27 | Disposition: A | Discharge status: Home / Self Care | Payer: Medicare Other | Source: Ambulatory Visit | Attending: Neurology | Admitting: Neurology

## 2022-08-27 DIAGNOSIS — R413 Other amnesia: Secondary | ICD-10-CM | POA: Diagnosis not present

## 2022-08-27 DIAGNOSIS — R419 Unspecified symptoms and signs involving cognitive functions and awareness: Secondary | ICD-10-CM | POA: Diagnosis not present

## 2022-08-27 DIAGNOSIS — Z820 Family history of epilepsy and other diseases of the nervous system: Secondary | ICD-10-CM

## 2022-08-27 DIAGNOSIS — R4789 Other speech disturbances: Secondary | ICD-10-CM

## 2022-08-27 DIAGNOSIS — R6889 Other general symptoms and signs: Secondary | ICD-10-CM

## 2022-08-27 MED ORDER — GADOBENATE DIMEGLUMINE 529 MG/ML IV SOLN
15.0000 mL | Freq: Once | INTRAVENOUS | Status: AC | PRN
  Administered 2022-08-27: 15 mL via INTRAVENOUS

## 2022-08-27 MED ORDER — GADOBENATE DIMEGLUMINE 529 MG/ML IV SOLN: 15.0000 mL | Freq: Once | INTRAVENOUS | Status: DC | PRN

## 2022-08-31 ENCOUNTER — Telehealth: Payer: Self-pay | Admitting: *Deleted

## 2022-08-31 NOTE — Telephone Encounter (Signed)
-----   Message from Star Age, MD sent at 08/31/2022 12:52 PM EDT ----- Brain MRI did not show any acute findings, changes are mostly in keeping with normal aging.  Normal contrast uptake, please update patient.

## 2022-08-31 NOTE — Telephone Encounter (Signed)
Spoke to patient gave MRI results Patient expressed understanding and thanked me for calling

## 2022-09-04 DIAGNOSIS — Z23 Encounter for immunization: Secondary | ICD-10-CM | POA: Diagnosis not present

## 2022-10-05 ENCOUNTER — Telehealth: Payer: Medicare Other | Admitting: Family Medicine

## 2022-10-05 DIAGNOSIS — J019 Acute sinusitis, unspecified: Secondary | ICD-10-CM | POA: Diagnosis not present

## 2022-10-05 DIAGNOSIS — B9689 Other specified bacterial agents as the cause of diseases classified elsewhere: Secondary | ICD-10-CM | POA: Diagnosis not present

## 2022-10-05 MED ORDER — AMOXICILLIN-POT CLAVULANATE 875-125 MG PO TABS: 1.0000 | ORAL_TABLET | Freq: Two times a day (BID) | ORAL | 0 refills | Status: AC

## 2022-10-05 NOTE — Progress Notes (Signed)

## 2022-10-19 ENCOUNTER — Encounter: Payer: Self-pay | Admitting: Family Medicine

## 2022-10-19 DIAGNOSIS — N390 Urinary tract infection, site not specified: Secondary | ICD-10-CM | POA: Diagnosis not present

## 2022-10-20 ENCOUNTER — Ambulatory Visit (INDEPENDENT_AMBULATORY_CARE_PROVIDER_SITE_OTHER): Payer: Medicare Other | Admitting: Family Medicine

## 2022-10-20 ENCOUNTER — Encounter: Payer: Self-pay | Admitting: Family Medicine

## 2022-10-20 VITALS — BP 107/68 | HR 90 | Ht 68.0 in | Wt 172.0 lb

## 2022-10-20 DIAGNOSIS — J0100 Acute maxillary sinusitis, unspecified: Secondary | ICD-10-CM | POA: Insufficient documentation

## 2022-10-20 DIAGNOSIS — N3090 Cystitis, unspecified without hematuria: Secondary | ICD-10-CM | POA: Diagnosis not present

## 2022-10-20 NOTE — Assessment & Plan Note (Signed)
Currently on Bactrim.  Symptoms improving.

## 2022-10-20 NOTE — Assessment & Plan Note (Signed)
This is resolved with recent course of amoxicillin.  Additionally Bactrim should help with this as well.  She will let me know if symptoms return.

## 2022-10-20 NOTE — Progress Notes (Signed)
Cindy [name] - 71 y.o. female MRN [id number]  Date of birth: 1951-03-19  Subjective Chief Complaint  Patient presents with   Urinary Tract Infection    HPI Cindy [name] is a a 71 year old female here today for follow-up of recent urgent care visit.  Seen in urgent care with complaint of respiratory illness.  She reports she was prescribed amoxicillin.  Symptoms improved with this and have essentially resolved.  She developed UTI symptoms shortly afterwards and was started on Bactrim for management of this.  The symptoms are improving.  No additional concerns at this time.  Denies any fever or chills.  ROS:  A comprehensive ROS was completed and negative except as noted per HPI  No Known Allergies  Past Medical History:  Diagnosis Date   Arthritis    Cancer ([place])    breast   Depression    GERD (gastroesophageal reflux disease)    Hyperlipidemia    Tendonitis     Past Surgical History:  Procedure Laterality Date   BREAST SURGERY     right   ELBOW SURGERY     right    Social History   Socioeconomic History   Marital status: Married    Spouse name: [name]   Number of children: 0   Years of education: 18   Highest education level: Master's degree (e.g., MA, MS, MEng, MEd, MSW, MBA)  Occupational History   Occupation: Retired.  Tobacco Use   Smoking status: Never   Smokeless tobacco: Never  Vaping Use   Vaping Use: Never used  Substance and Sexual Activity   Alcohol use: Yes    Comment: rarely   Drug use: No   Sexual activity: Yes    Birth control/protection: Post-menopausal  Other Topics Concern   Not on file  Social History Narrative   Lives with her husband. They help take care of her brother's grandchildren. She enjoys singing in the choir and gardening.   Social Determinants of Health   Financial Resource Strain: Low Risk  (12/26/2021)   Overall Financial Resource Strain (CARDIA)    Difficulty of Paying Living Expenses: Not hard at all  Food Insecurity: No Food  Insecurity (12/26/2021)   Hunger Vital Sign    Worried About Running Out of Food in the Last Year: Never true    Ran Out of Food in the Last Year: Never true  Transportation Needs: No Transportation Needs (12/26/2021)   PRAPARE - Hydrologist (Medical): No    Lack of Transportation (Non-Medical): No  Physical Activity: Inactive (12/26/2021)   Exercise Vital Sign    Days of Exercise per Week: 0 days    Minutes of Exercise per Session: 0 min  Stress: No Stress Concern Present (05/22/2019)   Santa Claus    Feeling of Stress : Only a little  Social Connections: Socially Integrated (12/26/2021)   Social Connection and Isolation Panel [NHANES]    Frequency of Communication with Friends and Family: Twice a week    Frequency of Social Gatherings with Friends and Family: Once a week    Attends Religious Services: More than 4 times per year    Active Member of [employer] or Organizations: Yes    Attends Music therapist: More than 4 times per year    Marital Status: Married    Family History  Problem Relation Age of Onset   Cancer Mother        bone  Hypertension Father    Diabetes Father    Stroke Father    Hypertension Sister    Alzheimer's disease Brother    Hypertension Sister    Colon cancer Neg Hx    Esophageal cancer Neg Hx    Rectal cancer Neg Hx    Stomach cancer Neg Hx     Health Maintenance  Topic Date Due   COVID-19 Vaccine (6 - Moderna series) 01/14/2023 (Originally 08/23/2022)   Medicare Annual Wellness (AWV)  12/26/2022   MAMMOGRAM  07/16/2023   TETANUS/TDAP  04/05/2028   COLONOSCOPY (Pts 45-44yr Insurance coverage will need to be confirmed)  08/28/2029   Pneumonia Vaccine 71 Years old  Completed   INFLUENZA VACCINE  Completed   DEXA SCAN  Completed   Hepatitis C Screening  Completed   Zoster Vaccines- Shingrix  Completed   HPV VACCINES  Aged Out      ----------------------------------------------------------------------------------------------------------------------------------------------------------------------------------------------------------------- Physical Exam BP 107/68 (BP Location: Right Arm, Patient Position: Sitting, Cuff Size: Normal)   Pulse 90   Ht '5\' 8"'$  (1.727 m)   Wt 172 lb (78 kg)   SpO2 98%   BMI 26.15 kg/m   Physical Exam Constitutional:      Appearance: Normal appearance.  HENT:     Head: Normocephalic and atraumatic.  Eyes:     General: No scleral icterus. Cardiovascular:     Rate and Rhythm: Normal rate and regular rhythm.  Neurological:     Mental Status: She is alert.  Psychiatric:        Mood and Affect: Mood normal.        Behavior: Behavior normal.     ------------------------------------------------------------------------------------------------------------------------------------------------------------------------------------------------------------------- Assessment and Plan  Cystitis Currently on Bactrim.  Symptoms improving.  Acute maxillary sinusitis This is resolved with recent course of amoxicillin.  Additionally Bactrim should help with this as well.  She will let me know if symptoms return.   No orders of the defined types were placed in this encounter.   No follow-ups on file.    This visit occurred during the SARS-CoV-2 public health emergency.  Safety protocols were in place, including screening questions prior to the visit, additional usage of staff PPE, and extensive cleaning of exam room while observing appropriate contact time as indicated for disinfecting solutions.

## 2022-10-22 ENCOUNTER — Encounter: Payer: Medicare Other | Attending: Psychology | Admitting: Psychology

## 2022-10-22 DIAGNOSIS — R4789 Other speech disturbances: Secondary | ICD-10-CM | POA: Diagnosis not present

## 2022-10-22 DIAGNOSIS — F418 Other specified anxiety disorders: Secondary | ICD-10-CM | POA: Diagnosis not present

## 2022-10-22 DIAGNOSIS — R413 Other amnesia: Secondary | ICD-10-CM | POA: Diagnosis not present

## 2022-11-10 DIAGNOSIS — F418 Other specified anxiety disorders: Secondary | ICD-10-CM

## 2022-11-10 DIAGNOSIS — R413 Other amnesia: Secondary | ICD-10-CM | POA: Diagnosis not present

## 2022-11-10 DIAGNOSIS — R4789 Other speech disturbances: Secondary | ICD-10-CM | POA: Diagnosis not present

## 2022-11-10 NOTE — Progress Notes (Signed)
Behavioral Observations The patient appeared well-groomed and appropriately dressed. Her manners were polite and appropriate to the situation. The patient's attitude toward testing was positive, her effort was good, and she was compliant with all testing instructions. The patient did have mild difficulty understanding/remembering instructions and required some repetition.   Neuropsychology Note  Cindy [name] completed 120 minutes of neuropsychological testing with technician, Dina Rich, BA, under the supervision of Ilean Skill, PsyD., Clinical Neuropsychologist. The patient did not appear overtly distressed by the testing session, per behavioral observation or via self-report to the technician. Rest breaks were offered.   Clinical Decision Making: In considering the patient's current level of functioning, level of presumed impairment, nature of symptoms, emotional and behavioral responses during clinical interview, level of literacy, and observed level of motivation/effort, a battery of tests was selected by Dr. Sima Matas during initial consultation on 10/22/2022. This was communicated to the technician. Communication between the neuropsychologist and technician was ongoing throughout the testing session and changes were made as deemed necessary based on patient performance on testing, technician observations and additional pertinent factors such as those listed above.  Tests Administered: Controlled Oral Word Association Test (COWAT; FAS & Animals)  Wechsler Adult Intelligence Scale, 4th Edition (WAIS-IV) Wechsler Memory Scale, 4th Edition (WMS-IV); Older Adult Battery   Results:  COWAT FAS Total = 43 Z = 0.08 Animals Total = 14 Z = -1      WAIS-IV  Composite Score Summary  Scale Sum of Scaled Scores Composite Score Percentile Rank 95% Conf. Interval Qualitative Description  Verbal Comprehension 24 VCI 89 23 84-95 Low Average  Perceptual Reasoning 20 PRI 81 10 76-88  Low Average  Working Memory 12 WMI 77 6 72-85 Borderline  Processing Speed 18 PSI 94 34 86-103 Average  Full Scale 74 FSIQ 82 12 78-86 Low Average  General Ability 44 GAI 82 12 78-87 Low Average       Verbal Comprehension Subtests Summary  Subtest Raw Score Scaled Score Percentile Rank Reference Group Scaled Score SEM  Similarities '15 6 9 5 '$ 1.04  Vocabulary 33 9 37 9 0.67  Information 13 9 37 10 0.73  (Comprehension) '20 8 25 8 '$ 1.08        Perceptual Reasoning Subtests Summary  Subtest Raw Score Scaled Score Percentile Rank Reference Group Scaled Score SEM  Block Design '24 8 25 6 '$ 1.08  Matrix Reasoning '6 5 5 3 '$ 0.90  Visual Puzzles '7 7 16 5 '$ 0.85  (Figure Weights) 10 9 37 7 0.95  (Picture Completion) '9 8 25 7 '$ 1.16        Working Doctor, general practice Raw Score Scaled Score Percentile Rank Reference Group Scaled Score SEM  Digit Span '13 3 1 2 '$ 0.79  Arithmetic 12 9 37 9 0.99  (Letter-Number Seq.) '13 6 9 5 '$ 1.04        Processing Speed Subtests Summary  Subtest Raw Score Scaled Score Percentile Rank Reference Group Scaled Score SEM  Symbol Search 24 9 37 7 1.31  Coding 51 9 37 6 0.99  (Cancellation) '27 7 16 6 '$ 1.34        WMS-IV  Index Score Summary  Index Sum of Scaled Scores Index Score Percentile Rank 95% Confidence Interval Qualitative Descriptor  Auditory Memory (AMI) 22 74 4 69-82 Borderline  Visual Memory (VMI) 10 73 4 69-79 Borderline  Immediate Memory (IMI) 21 81 10 76-88 Low Average  Delayed Memory (DMI) 11 61 0.5 56-72 Extremely Low  Primary Subtest Scaled Score Summary  Subtest Domain Raw Score Scaled Score Percentile Rank  Logical Memory I AM 30 9 37  Logical Memory II AM '9 6 9  '$ Verbal Paired Associates I AM '9 6 9  '$ Verbal Paired Associates II AM 0 1 0.1  Visual Reproduction I VM '24 6 9  '$ Visual Reproduction II VM '4 4 2  '$ Symbol Span VWM '14 8 25       '$ Auditory Memory Process Score Summary  Process Score Raw Score  Scaled Score Percentile Rank Cumulative Percentage (Base Rate)  LM II Recognition 14 - - <=2%  VPA II Recognition 13 - - <=2%        Visual Memory Process Score Summary  Process Score Raw Score Scaled Score Percentile Rank Cumulative Percentage (Base Rate)  VR II Recognition 2 - - 3-9%       ABILITY-MEMORY ANALYSIS  Ability Score:  VCI: 89 Date of Testing:  WAIS-IV; WMS-IV 2022/11/10  Predicted Difference Method   Index Predicted WMS-IV Index Score Actual WMS-IV Index Score Difference Critical Value  Significant Difference Y/N Base Rate  Auditory Memory 94 74 20 9.51 Y 5-10%  Visual Memory 95 73 22 8.07 Y 5-10%  Immediate Memory 94 81 13 10.30 Y 15%  Delayed Memory 94 61 33 11.51 Y <1%  Statistical significance (critical value) at the .01 level.        Feedback to Patient: Cindy [name] will return on 04/27/2022 or sooner for an interactive feedback session with Dr. Sima Matas at which time her test performances, clinical impressions and treatment recommendations will be reviewed in detail. The patient understands she can contact our office should she require our assistance before this time.  120 minutes spent face-to-face with patient administering standardized tests, 30 minutes spent scoring Environmental education officer). [CPT [id number], [id number]]  Full report to follow.

## 2022-11-22 NOTE — Progress Notes (Signed)
Neuropsychological Consultation   Patient:   Cindy [name]   DOB:   01/24/1951  MR Number:  [id number]  Location:  [facility] [place], [place] [id number] [place] [address] Dept: [phone]           Date of Service:   10/22/2022  Start Time:   1 PM End Time:   3 PM  Today's visit was an in person visit that was scheduled and conducted in my outpatient clinic office.  The patient and myself were present for this visit.  1 hour and 15 minutes was spent in face-to-face clinical interview and the other 45 minutes was spent with records review, report writing and setting up testing protocols.  Provider/Observer:  Ilean Skill, Psy.D.       Clinical Neuropsychologist       Billing Code/Service: 96116/96121  Reason for Service:    Cindy [name] is a 71 year old female referred for neuropsychological evaluation by her treating neurologist Star Age, MD due to increasing memory difficulties and memory loss with the family history of dementia and one of her siblings was diagnosed in the siblings early 53s.  Patient has a past history of depression that developed after failed marriage in the late 1980s and was also exacerbated after a diagnosis and treatment for breast cancer in 1999.  Patient has a past medical history including reflux disease, hyperlipidemia, breast cancer, arthritis, anxiety, depression, overweight's date.  Patient has been describing memory difficulties and other cognitive issues for a little over a year now.  Primary memory issues have to do with difficulty remembering names of people and objects particular people that she has known for years.  Patient is also described issues with concentration and attention.  During the clinical interview today the patient reports that she really started noticing significant memory issues over the past 8 to 10 months and  that she will see someone that she has done very well and cannot recall their name.  She does report that this tends to clear up and improve at times.  The patient is concerned about these changes as she has a brother with a history of diagnosis of Alzheimer's dementia who passed away at age 30.  He was diagnosed initially in his early 36s.  He spent 7 years in a nursing home ultimately dying of pneumonia and Alzheimer symptoms.  The patient is the youngest of 4 children with her oldest sister being 62 years old and living in a retirement community and her second oldest sister is 59 years old with arthritis but no cognitive issues.  The patient denies any visual hallucinations, tremors or other big/major medical issues.  Current medications include Wellbutrin and Paxil.  Patient reports that she loves to sleep and also take some naps during the day.  The patient does not have any symptoms that would suggest obstructive sleep apnea or other pulmonary issues.  The patient has had significant stressors over the past several years with one of them being a great niece who she was very close to passing away from melanoma.  The patient also was close to the nieces young children and she has had less contact with her 78 young children after her great niece passed away.  The patient reports that her other psychosocial stressors within the family as well as the patient and her husband having to take care of some aspects with her children.  She  describes her husband is very helpful and he is often encouraging her to do things to help with her depression.  Patient had a brain MRI with and without contrast performed on 08/27/2022 and interpreted by Arlice Colt, MD. impressions from this MRI showed that brain volume was normal for age and was compared to a 2017 CT scan suggesting some mild atrophy.  T2/FLAIR hyperintense foci in the subcortical and deep white matter and subcortical regions, parietal lobes and more in the  frontal lobes.  There was no acute findings.  Behavioral Observation: Cindy [name]  presents as a 72 y.o.-year-old Right handed Caucasian Female who appeared her stated age. her dress was Appropriate and she was Well Groomed and her manners were Appropriate to the situation.  her participation was indicative of Appropriate behaviors.  There were not physical disabilities noted.  she displayed an appropriate level of cooperation and motivation.    Interactions:    Active Appropriate  Attention:   abnormal with some indications of encoding deficits  Memory:   abnormal; remote memory intact recent memory described as impaired  Visuo-spatial:  within normal limits  Speech (Volume):  normal  Speech:   normal; normal  Thought Process:  Coherent and Relevant  Though Content:  WNL; not suicidal and not homicidal  Orientation:   person, place, time/date, and situation  Judgment:   Good  Planning:   Fair  Affect:    Appropriate  Mood:    Dysphoric  Insight:   Good  Intelligence:   normal  Marital Status/Living: Patient was born and raised in [place] along with 3 siblings.  No childhood illness or developmental issues were noted.  The patient continues to live with her husband and they have lived together since their marriage in 61.  Patient had 1 previous marriage that lasted for 6 years.  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  Substance Use:  No concerns of substance abuse are reported.  Patient has very limited use of alcohol with a glass of wine 2 or 3 times per year.  No other substance use noted.  Education:   Patient  completed high school and had 2 years to complete her associate degree in nursing and then attended [employer] to obtain her BSN in nursing and was a member of the [employer].  The patient attended [employer] to receive her dual masters including an Microbiologist of health administration.  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  Family Med/Psych History:  Family History  Problem Relation Age of Onset   Cancer Mother        bone   Hypertension Father    Diabetes Father    Stroke Father    Hypertension Sister    Alzheimer's disease Brother    Hypertension Sister    Colon cancer Neg Hx    Esophageal cancer Neg Hx    Rectal cancer Neg Hx    Stomach cancer Neg Hx     Risk of Suicide/Violence: virtually non-existent   Impression/DX:  Cindy [name] is a 71 year old female referred for neuropsychological evaluation by her treating neurologist Star Age, MD due to increasing  memory difficulties and memory loss with the family history of dementia and one of her siblings was diagnosed in the siblings early 43s.  Patient has a past history of depression that developed after failed marriage in the late 1980s and was also exacerbated after a diagnosis and treatment for breast cancer in 1999.  Patient has a past medical history including reflux disease, hyperlipidemia, breast cancer, arthritis, anxiety, depression, overweight's date.  Patient has been describing memory difficulties and other cognitive issues for a little over a year now.  Primary memory issues have to do with difficulty remembering names of people and objects particular people that she has known for years.  Patient is also described issues with concentration and attention.  Disposition/Plan:  We have set the patient up for formal neuropsychological assessment and she will complete a foundational battery including the Wechsler Adult Intelligence Scale's and Timoteo Expose memory scales in the controlled oral Word Association test.  Once these are completed a determination will be made as to any potential need for further assessment to address the pertinent referral questions and aid in diagnostic and treatment recommendations.  Once this is completed to the patient is well is make sure that the results are available in the patient's EMR for her referring physician and her PCP.  Diagnosis:    Memory loss  Word finding difficulty  Depression with anxiety         Electronically Signed   _______________________ Ilean Skill, Psy.D. Clinical Neuropsychologist

## 2022-11-26 ENCOUNTER — Encounter: Payer: Medicare Other | Attending: Psychology | Admitting: Psychology

## 2022-11-26 DIAGNOSIS — R413 Other amnesia: Secondary | ICD-10-CM | POA: Diagnosis not present

## 2022-11-26 DIAGNOSIS — G3184 Mild cognitive impairment, so stated: Secondary | ICD-10-CM | POA: Diagnosis not present

## 2022-11-26 DIAGNOSIS — R4789 Other speech disturbances: Secondary | ICD-10-CM | POA: Diagnosis not present

## 2022-11-26 DIAGNOSIS — F418 Other specified anxiety disorders: Secondary | ICD-10-CM | POA: Insufficient documentation

## 2022-12-01 ENCOUNTER — Ambulatory Visit: Payer: Medicare Other | Admitting: Psychology

## 2022-12-01 ENCOUNTER — Ambulatory Visit: Payer: Medicare Other | Admitting: Family Medicine

## 2022-12-01 ENCOUNTER — Encounter (HOSPITAL_BASED_OUTPATIENT_CLINIC_OR_DEPARTMENT_OTHER): Payer: Medicare Other | Admitting: Psychology

## 2022-12-01 DIAGNOSIS — R413 Other amnesia: Secondary | ICD-10-CM

## 2022-12-01 DIAGNOSIS — F418 Other specified anxiety disorders: Secondary | ICD-10-CM | POA: Diagnosis not present

## 2022-12-01 DIAGNOSIS — R4789 Other speech disturbances: Secondary | ICD-10-CM

## 2022-12-01 DIAGNOSIS — G3184 Mild cognitive impairment, so stated: Secondary | ICD-10-CM | POA: Diagnosis not present

## 2022-12-03 ENCOUNTER — Ambulatory Visit (INDEPENDENT_AMBULATORY_CARE_PROVIDER_SITE_OTHER): Payer: Medicare Other | Admitting: Family Medicine

## 2022-12-03 VITALS — Temp 97.5°F | Ht 68.0 in | Wt 172.0 lb

## 2022-12-03 DIAGNOSIS — Z23 Encounter for immunization: Secondary | ICD-10-CM | POA: Diagnosis not present

## 2022-12-03 NOTE — Progress Notes (Signed)
Medical screening examination/treatment was performed by qualified clinical staff member and as supervising physician I was immediately available for consultation/collaboration. I have reviewed documentation and agree with assessment and plan.  Jakyren Fluegge, DO  

## 2022-12-03 NOTE — Progress Notes (Signed)
Pt presents to day for immunization. No allergy to eggs or latex.   Location: RD  Pt tolerated well.

## 2022-12-16 ENCOUNTER — Encounter: Payer: Self-pay | Admitting: Family Medicine

## 2022-12-16 ENCOUNTER — Ambulatory Visit (INDEPENDENT_AMBULATORY_CARE_PROVIDER_SITE_OTHER): Payer: Medicare Other | Admitting: Family Medicine

## 2022-12-16 VITALS — BP 119/73 | HR 71 | Ht 68.0 in | Wt 175.0 lb

## 2022-12-16 DIAGNOSIS — E7800 Pure hypercholesterolemia, unspecified: Secondary | ICD-10-CM

## 2022-12-16 DIAGNOSIS — G3184 Mild cognitive impairment, so stated: Secondary | ICD-10-CM | POA: Diagnosis not present

## 2022-12-16 DIAGNOSIS — F330 Major depressive disorder, recurrent, mild: Secondary | ICD-10-CM

## 2022-12-16 NOTE — Assessment & Plan Note (Signed)
She is tolerating lovastatin well at current strength.  Updating lipid panel and CMP today.

## 2022-12-16 NOTE — Progress Notes (Signed)
Cindy [name] - 72 y.o. female MRN [id number]  Date of birth: 19-Mar-1951  Subjective Chief Complaint  Patient presents with   Follow-up    HPI Cindy [name] is a 72 year old female here today for follow-up visit.  She has noticed some cognitive changes for the past several months.  Initially seen by neurology and had workup initiated.  No organic cause noted.  She was referred to neuropsychiatry.  Diagnosed with memory loss, short-term.  She reports that Dr. Sima Matas discussed with her following up with Dr. Rexene Alberts regarding medication that may be helpful.  She does not currently have a follow-up scheduled with Dr. Rexene Alberts and does not want to delay getting started on medication.  She does have prescription for clonazepam that she uses as needed for anxiety.  She does not use this very often.  Remains on combination of Paxil and bupropion for treatment of depression.  She feels like this is working well for her.  Remains on lovastatin for management of hyperlipidemia.  Tolerating this well at this time.  She is fasting for labs today.  ROS:  A comprehensive ROS was completed and negative except as noted per HPI  No Known Allergies  Past Medical History:  Diagnosis Date   Arthritis    Cancer ([place])    breast   Depression    GERD (gastroesophageal reflux disease)    Hyperlipidemia    Tendonitis     Past Surgical History:  Procedure Laterality Date   BREAST SURGERY     right   ELBOW SURGERY     right    Social History   Socioeconomic History   Marital status: Married    Spouse name: [name]   Number of children: 0   Years of education: 18   Highest education level: Master's degree (e.g., MA, MS, MEng, MEd, MSW, MBA)  Occupational History   Occupation: Retired.  Tobacco Use   Smoking status: Never   Smokeless tobacco: Never  Vaping Use   Vaping Use: Never used  Substance and Sexual Activity   Alcohol use: Yes    Comment: rarely   Drug use: No   Sexual activity: Yes    Birth  control/protection: Post-menopausal  Other Topics Concern   Not on file  Social History Narrative   Lives with her husband. They help take care of her brother's grandchildren. She enjoys singing in the choir and gardening.   Social Determinants of Health   Financial Resource Strain: Low Risk  (12/26/2021)   Overall Financial Resource Strain (CARDIA)    Difficulty of Paying Living Expenses: Not hard at all  Food Insecurity: No Food Insecurity (12/26/2021)   Hunger Vital Sign    Worried About Running Out of Food in the Last Year: Never true    Ran Out of Food in the Last Year: Never true  Transportation Needs: No Transportation Needs (12/26/2021)   PRAPARE - Hydrologist (Medical): No    Lack of Transportation (Non-Medical): No  Physical Activity: Inactive (12/26/2021)   Exercise Vital Sign    Days of Exercise per Week: 0 days    Minutes of Exercise per Session: 0 min  Stress: No Stress Concern Present (05/22/2019)   [place]    Feeling of Stress : Only a little  Social Connections: Socially Integrated (12/26/2021)   Social Connection and Isolation Panel [NHANES]    Frequency of Communication with Friends and Family: Twice a week  Frequency of Social Gatherings with Friends and Family: Once a week    Attends Religious Services: More than 4 times per year    Active Member of [employer] or Organizations: Yes    Attends Music therapist: More than 4 times per year    Marital Status: Married    Family History  Problem Relation Age of Onset   Cancer Mother        bone   Hypertension Father    Diabetes Father    Stroke Father    Hypertension Sister    Alzheimer's disease Brother    Hypertension Sister    Colon cancer Neg Hx    Esophageal cancer Neg Hx    Rectal cancer Neg Hx    Stomach cancer Neg Hx     Health Maintenance  Topic Date Due   Medicare Annual Wellness (AWV)   12/26/2022   MAMMOGRAM  07/16/2023   DTaP/Tdap/Td (4 - Td or Tdap) 04/05/2028   COLONOSCOPY (Pts 45-28yr Insurance coverage will need to be confirmed)  08/28/2029   Pneumonia Vaccine 72 Years old  Completed   INFLUENZA VACCINE  Completed   DEXA SCAN  Completed   COVID-19 Vaccine  Completed   Hepatitis C Screening  Completed   Zoster Vaccines- Shingrix  Completed   HPV VACCINES  Aged Out     ----------------------------------------------------------------------------------------------------------------------------------------------------------------------------------------------------------------- Physical Exam BP 119/73 (BP Location: Left Arm, Patient Position: Sitting, Cuff Size: Normal)   Pulse 71   Ht '5\' 8"'$  (1.727 m)   Wt 175 lb (79.4 kg)   SpO2 99%   BMI 26.61 kg/m   Physical Exam Constitutional:      Appearance: Normal appearance.  HENT:     Head: Normocephalic and atraumatic.     Mouth/Throat:     Pharynx: No posterior oropharyngeal erythema.  Eyes:     General: No scleral icterus. Neurological:     Mental Status: She is alert.  Psychiatric:        Mood and Affect: Mood normal.        Behavior: Behavior normal.     ------------------------------------------------------------------------------------------------------------------------------------------------------------------------------------------------------------------- Assessment and Plan  Hyperlipidemia She is tolerating lovastatin well at current strength.  Updating lipid panel and CMP today.  MDD (major depressive disorder), recurrent episode ([place] Remains on the combination of Paxlovid).  Continues with fairly well for her.  She does have a prescription for lorazepam but uses this only on occasion.  Mild cognitive impairment with memory loss Recent assessment by Dr. RSima Matas  Encouraged her to schedule follow-up with Dr. ARexene Alberts  She is interested in starting medication we discussed adding Aricept  5 mg daily.     No orders of the defined types were placed in this encounter.   Return in about 6 months (around 06/16/2023) for HLD/cognitive changes.    This visit occurred during the SARS-CoV-2 public health emergency.  Safety protocols were in place, including screening questions prior to the visit, additional usage of staff PPE, and extensive cleaning of exam room while observing appropriate contact time as indicated for disinfecting solutions.

## 2022-12-16 NOTE — Patient Instructions (Addendum)
Great to see you today! I would recommend getting RSV vaccine.  This is available at your pharmacy.

## 2022-12-16 NOTE — Assessment & Plan Note (Signed)
Recent assessment by Dr. Sima Matas.  Encouraged her to schedule follow-up with Dr. Rexene Alberts.  She is interested in starting medication we discussed adding Aricept 5 mg daily.

## 2022-12-16 NOTE — Assessment & Plan Note (Signed)
Remains on the combination of Paxlovid).  Continues with fairly well for her.  She does have a prescription for lorazepam but uses this only on occasion.

## 2022-12-17 ENCOUNTER — Ambulatory Visit: Payer: Medicare Other | Admitting: Psychology

## 2022-12-17 ENCOUNTER — Encounter: Payer: Self-pay | Admitting: Family Medicine

## 2022-12-17 LAB — LIPID PANEL W/REFLEX DIRECT LDL
Cholesterol: 218 mg/dL — ABNORMAL HIGH (ref <200)
HDL: 91 mg/dL (ref >=50)
LDL Cholesterol (Calc): 107 mg/dL (calc) — ABNORMAL HIGH
Non-HDL Cholesterol (Calc): 127 mg/dL (calc) (ref <130)
Total CHOL/HDL Ratio: 2.4 (calc) (ref <5.0)
Triglycerides: 102 mg/dL (ref <150)

## 2022-12-17 LAB — CBC WITH DIFFERENTIAL/PLATELET
Absolute Monocytes: 429 cells/uL (ref 200–950)
Basophils Absolute: 12 cells/uL (ref 0–200)
Basophils Relative: 0.2 %
Eosinophils Absolute: 52 cells/uL (ref 15–500)
Eosinophils Relative: 0.9 %
HCT: 42.5 % (ref 35.0–45.0)
Hemoglobin: 14.1 g/dL (ref 11.7–15.5)
Lymphs Abs: 1914 cells/uL (ref 850–3900)
MCH: 28.7 pg (ref 27.0–33.0)
MCHC: 33.2 g/dL (ref 32.0–36.0)
MCV: 86.4 fL (ref 80.0–100.0)
MPV: 12.4 fL (ref 7.5–12.5)
Monocytes Relative: 7.4 %
Neutro Abs: 3393 cells/uL (ref 1500–7800)
Neutrophils Relative %: 58.5 %
Platelets: 238 10*3/uL (ref 140–400)
RBC: 4.92 10*6/uL (ref 3.80–5.10)
RDW: 14.1 % (ref 11.0–15.0)
Total Lymphocyte: 33 %
WBC: 5.8 10*3/uL (ref 3.8–10.8)

## 2022-12-17 LAB — COMPLETE METABOLIC PANEL WITH GFR
AG Ratio: 1.8 (calc) (ref 1.0–2.5)
ALT: 19 U/L (ref 6–29)
AST: 20 U/L (ref 10–35)
Albumin: 4.8 g/dL (ref 3.6–5.1)
Alkaline phosphatase (APISO): 94 U/L (ref 37–153)
BUN: 10 mg/dL (ref 7–25)
CO2: 27 mmol/L (ref 20–32)
Calcium: 9.7 mg/dL (ref 8.6–10.4)
Chloride: 103 mmol/L (ref 98–110)
Creat: 1.02 mg/dL (ref 0.50–1.05)
Globulin: 2.6 g/dL (calc) (ref 1.9–3.7)
Glucose, Bld: 94 mg/dL (ref 65–99)
Potassium: 4.8 mmol/L (ref 3.5–5.3)
Sodium: 140 mmol/L (ref 135–146)
Total Bilirubin: 1 mg/dL (ref 0.2–1.2)
Total Protein: 7.4 g/dL (ref 6.1–8.1)
eGFR: 60 mL/min/{1.73_m2} (ref >=60)

## 2022-12-17 MED ORDER — AREXVY 120 MCG/0.5ML IM SUSR: 0.5000 mL | Freq: Once | INTRAMUSCULAR | 0 refills | Status: AC

## 2022-12-17 MED ORDER — DONEPEZIL HCL 5 MG PO TABS: 5.0000 mg | ORAL_TABLET | Freq: Every day | ORAL | 0 refills | Status: DC

## 2022-12-17 NOTE — Addendum Note (Signed)
Addended by: Perlie Mayo on: 12/17/2022 04:41 PM   Modules accepted: Orders

## 2022-12-28 ENCOUNTER — Ambulatory Visit (INDEPENDENT_AMBULATORY_CARE_PROVIDER_SITE_OTHER): Payer: Medicare Other | Admitting: Family Medicine

## 2022-12-28 DIAGNOSIS — Z0000 Encounter for general adult medical examination without abnormal findings: Secondary | ICD-10-CM | POA: Diagnosis not present

## 2022-12-28 NOTE — Progress Notes (Signed)
MEDICARE ANNUAL WELLNESS VISIT  12/28/2022  Telephone Visit Disclaimer This Medicare AWV was conducted by telephone due to national recommendations for restrictions regarding the COVID-19 Pandemic (e.g. social distancing).  I verified, using two identifiers, that I am speaking with Cindy [name] or their authorized healthcare agent. I discussed the limitations, risks, security, and privacy concerns of performing an evaluation and management service by telephone and the potential availability of an in-person appointment in the future. The patient expressed understanding and agreed to proceed.  Location of Patient: Home Location of Provider (nurse):  Provider home  Subjective:    Cindy [name] is a 72 y.o. female patient of Luetta Nutting, DO who had a Medicare Annual Wellness Visit today via telephone. Monica is Retired and lives with their spouse. she does not have any children. she reports that she is socially active and does interact with friends/family regularly. she is minimally physically active and enjoys gardening and the choir.  Patient Care Team: Luetta Nutting, DO as PCP - General (Family Medicine)     12/28/2022    8:44 AM 12/26/2021    8:58 AM 03/26/2019    6:16 PM 04/17/2016    9:25 PM  Advanced Directives  Does Patient Have a Medical Advance Directive? Yes Yes Yes Yes  Type of Advance Directive Living will Living will;Healthcare Power of [place];Living will Living will;Healthcare Power of Attorney  Does patient want to make changes to medical advance directive? No - Patient declined No - Patient declined No - Patient declined   Copy of [place] in Chart?  No - copy requested No - copy requested No - copy requested    Hospital Utilization Over the Past 12 Months: # of hospitalizations or ER visits: 0 # of surgeries: 0  Review of Systems    Patient reports that her overall health is unchanged compared to last  year.  History obtained from chart review and the patient  Patient Reported Readings (BP, Pulse, CBG, Weight, etc) none  Pain Assessment Pain : No/denies pain     Current Medications & Allergies (verified) Allergies as of 12/28/2022   No Known Allergies      Medication List        Accurate as of December 28, 2022  9:03 AM. If you have any questions, ask your nurse or doctor.          acyclovir cream 5 % Commonly known as: ZOVIRAX Apply 1 application topically every 3 (three) hours. What changed:  when to take this reasons to take this   buPROPion 150 MG 24 hr tablet Commonly known as: WELLBUTRIN XL TAKE 3 TABLETS EVERY DAY   CALCIUM 1200 PO Take 1 tablet by mouth daily.   clonazePAM 1 MG tablet Commonly known as: KLONOPIN TAKE 1 TABLET TWICE DAILY   dicyclomine 20 MG tablet Commonly known as: BENTYL TAKE 1 TABLET THREE TIMES DAILY AS NEEDED   docusate sodium 100 MG capsule Commonly known as: COLACE Take 100 mg by mouth at bedtime.   donepezil 5 MG tablet Commonly known as: ARICEPT Take 1 tablet (5 mg total) by mouth at bedtime.   lovastatin 20 MG tablet Commonly known as: MEVACOR TAKE 1 TABLET (20 MG TOTAL) BY MOUTH AT BEDTIME.   multivitamin tablet Take 1 tablet by mouth daily.   omeprazole 40 MG capsule Commonly known as: PRILOSEC TAKE 1 CAPSULE EVERY DAY   PARoxetine 20 MG tablet Commonly known as: PAXIL TAKE  2 TABLETS EVERY DAY   polyethylene glycol 17 g packet Commonly known as: MIRALAX / GLYCOLAX Take 17 g by mouth as needed.   trimethoprim 100 MG tablet Commonly known as: TRIMPEX TAKE 1 TABLET EVERY DAY   valACYclovir 500 MG tablet Commonly known as: VALTREX TAKE 1 TABLET EVERY DAY   ZINC 15 PO Take by mouth.        History (reviewed): Past Medical History:  Diagnosis Date   Arthritis    Cancer ([place])    breast   Depression    GERD (gastroesophageal reflux disease)    Hyperlipidemia    Tendonitis    Past  Surgical History:  Procedure Laterality Date   BREAST SURGERY     right   ELBOW SURGERY     right   Family History  Problem Relation Age of Onset   Cancer Mother        bone   Hypertension Father    Diabetes Father    Stroke Father    Hypertension Sister    Alzheimer's disease Brother    Hypertension Sister    Colon cancer Neg Hx    Esophageal cancer Neg Hx    Rectal cancer Neg Hx    Stomach cancer Neg Hx    Social History   Socioeconomic History   Marital status: Married    Spouse name: [name]   Number of children: 0   Years of education: 18   Highest education level: Master's degree (e.g., MA, MS, MEng, MEd, MSW, MBA)  Occupational History   Occupation: Retired.  Tobacco Use   Smoking status: Never   Smokeless tobacco: Never  Vaping Use   Vaping Use: Never used  Substance and Sexual Activity   Alcohol use: Not Currently    Comment: rarely   Drug use: No   Sexual activity: Yes    Birth control/protection: Post-menopausal  Other Topics Concern   Not on file  Social History Narrative   Lives with her husband. They help take care of her brother's grandchildren. She enjoys singing in the choir and gardening.   Social Determinants of Health   Financial Resource Strain: Low Risk  (12/28/2022)   Overall Financial Resource Strain (CARDIA)    Difficulty of Paying Living Expenses: Not hard at all  Food Insecurity: No Food Insecurity (12/28/2022)   Hunger Vital Sign    Worried About Running Out of Food in the Last Year: Never true    Ran Out of Food in the Last Year: Never true  Transportation Needs: No Transportation Needs (12/28/2022)   PRAPARE - Hydrologist (Medical): No    Lack of Transportation (Non-Medical): No  Physical Activity: Inactive (12/28/2022)   Exercise Vital Sign    Days of Exercise per Week: 0 days    Minutes of Exercise per Session: 0 min  Stress: No Stress Concern Present (12/28/2022)   [place]    Feeling of Stress : Not at all  Social Connections: [place] (12/28/2022)   Social Connection and Isolation Panel [NHANES]    Frequency of Communication with Friends and Family: More than three times a week    Frequency of Social Gatherings with Friends and Family: More than three times a week    Attends Religious Services: More than 4 times per year    Active Member of [employer] or Organizations: Yes    Attends Archivist Meetings: More than 4 times  per year    Marital Status: Married    Activities of Daily Living    12/28/2022    8:50 AM  In your present state of health, do you have any difficulty performing the following activities:  Hearing? 0  Vision? 0  Difficulty concentrating or making decisions? 1  Comment some short term memory loss  Walking or climbing stairs? 0  Dressing or bathing? 0  Doing errands, shopping? 0  Preparing Food and eating ? N  Using the Toilet? N  In the past six months, have you accidently leaked urine? N  Do you have problems with loss of bowel control? N  Managing your Medications? N  Managing your Finances? N  Housekeeping or managing your Housekeeping? N    Patient Education/ Literacy How often do you need to have someone help you when you read instructions, pamphlets, or other written materials from your doctor or pharmacy?: 1 - Never What is the last grade level you completed in school?: 2 Masters degrees  Exercise Current Exercise Habits: The patient does not participate in regular exercise at present, Exercise limited by: None identified  Diet Patient reports consuming 3 meals a day and 2 snack(s) a day Patient reports that her primary diet is: Regular Patient reports that she does have regular access to food.   Depression Screen    12/28/2022    8:44 AM 12/16/2022   11:29 AM 10/20/2022    8:41 AM 06/01/2022    9:18 AM 12/26/2021    8:59 AM 12/02/2021   10:11 AM 12/02/2021     9:46 AM  PHQ 2/9 Scores  PHQ - 2 Score 0 2 0 1 0 2 2  PHQ- 9 Score 0 '6 2 5 '$ 0 10      Fall Risk    12/28/2022    8:44 AM 06/01/2022    9:19 AM 12/26/2021    8:58 AM 11/14/2021   11:28 AM 05/23/2021    9:44 AM  Fall Risk   Falls in the past year? 0 0 0 0 0  Number falls in past yr: 0 0 0 0 0  Injury with Fall? 0 0 0 0 0  Risk for fall due to : No Fall Risks No Fall Risks No Fall Risks  No Fall Risks  Follow up Falls evaluation completed Falls evaluation completed Falls evaluation completed Falls evaluation completed Falls evaluation completed     Objective:  Cindy [name] seemed alert and oriented and she participated appropriately during our telephone visit.  Blood Pressure Weight BMI  BP Readings from Last 3 Encounters:  12/16/22 119/73  10/20/22 107/68  08/10/22 139/62   Wt Readings from Last 3 Encounters:  12/16/22 175 lb (79.4 kg)  12/03/22 172 lb (78 kg)  10/20/22 172 lb (78 kg)   BMI Readings from Last 1 Encounters:  12/16/22 26.61 kg/m    *Unable to obtain current vital signs, weight, and BMI due to telephone visit type  Hearing/Vision  Cindy [name] did not seem to have difficulty with hearing/understanding during the telephone conversation Reports that she has had a formal eye exam by an eye care professional within the past year Reports that she has not had a formal hearing evaluation within the past year *Unable to fully assess hearing and vision during telephone visit type  Cognitive Function:    12/28/2022    8:52 AM 12/26/2021    9:07 AM  6CIT Screen  What Year? 0 points 0 points  What month? 0  points 0 points  What time? 0 points 0 points  Count back from 20 0 points 0 points  Months in reverse 0 points 0 points  Repeat phrase 0 points 0 points  Total Score 0 points 0 points   (Normal:0-7, Significant for Dysfunction: >8)  Normal Cognitive Function Screening: Yes   Immunization & Health Maintenance Record Immunization History  Administered  Date(s) Administered   COVID-19, mRNA, vaccine(Comirnaty)12 years and older 12/03/2022   Hepatitis B 10/19/2011   Influenza Split 09/27/2013   Influenza, High Dose Seasonal PF 08/30/2019   Influenza,inj,Quad PF,6+ Mos 09/17/2014, 09/10/2016   Influenza,inj,Quad PF,6-35 Mos 10/11/2015   Influenza-Unspecified 10/07/2017, 08/30/2019, 09/13/2020, 09/04/2021, 09/10/2022   Moderna Covid-19 Vaccine Bivalent Booster 8yr & up 04/22/2022   Moderna Sars-Covid-2 Vaccination 01/12/2020, 01/31/2020, 10/30/2020, 08/26/2021   Pneumococcal Conjugate-13 04/05/2018   Pneumococcal Polysaccharide-23 05/22/2019   Td 04/07/2005, 12/15/2007   Tdap 04/05/2018   Zoster Recombinat (Shingrix) 06/11/2018, 08/30/2018   Zoster, Live 12/15/2007    Health Maintenance  Topic Date Due   MAMMOGRAM  07/16/2023   Medicare Annual Wellness (AWV)  12/29/2023   DTaP/Tdap/Td (4 - Td or Tdap) 04/05/2028   COLONOSCOPY (Pts 45-499yrInsurance coverage will need to be confirmed)  08/28/2029   Pneumonia Vaccine 6579Years old  Completed   INFLUENZA VACCINE  Completed   DEXA SCAN  Completed   COVID-19 Vaccine  Completed   Hepatitis C Screening  Completed   Zoster Vaccines- Shingrix  Completed   HPV VACCINES  Aged Out       Assessment  This is a routine wellness examination for [employer] Health Maintenance: Due or Overdue There are no preventive care reminders to display for this patient.   [facility] not need a referral for Community Assistance: Care Management:   no Social Work:    no Prescription Assistance:  no Nutrition/Diabetes Education:  no   Plan:  Personalized Goals  Goals Addressed               This Visit's Progress     Patient Stated (pt-stated)        12/28/2022 AWV Goal: Exercise for General Health  Patient will verbalize understanding of the benefits of increased physical activity: Exercising regularly is important. It will improve your overall fitness, flexibility, and  endurance. Regular exercise also will improve your overall health. It can help you control your weight, reduce stress, and improve your bone density. Over the next year, patient will increase physical activity as tolerated with a goal of at least 150 minutes of moderate physical activity per week.  You can tell that you are exercising at a moderate intensity if your heart starts beating faster and you start breathing faster but can still hold a conversation. Moderate-intensity exercise ideas include: Walking 1 mile (1.6 km) in about 15 minutes Biking Hiking Golfing Dancing Water aerobics Patient will verbalize understanding of everyday activities that increase physical activity by providing examples like the following: Yard work, such as: PuSales promotion account executiveardening Washing windows or floors Patient will be able to explain general safety guidelines for exercising:  Before you start a new exercise program, talk with your health care provider. Do not exercise so much that you hurt yourself, feel dizzy, or get very short of breath. Wear comfortable clothes and wear shoes with good support. Drink plenty of water while you exercise to prevent dehydration or heat  stroke. Work out until your breathing and your heartbeat get faster.        Personalized Health Maintenance & Screening Recommendations  There are no preventive care reminders to display for this patient.  Lung Cancer Screening Recommended: no (Low Dose CT Chest recommended if Age 64-80 years, 30 pack-year currently smoking OR have quit w/in past 15 years) Hepatitis C Screening recommended: no HIV Screening recommended: no  Advanced Directives: Written information was not prepared per patient's request.  Referrals & Orders No orders of the defined types were placed in this encounter.   Follow-up Plan Follow-up with Luetta Nutting, DO as  planned Medicare wellness visit in one year.  Patient will acces AVS on my chart.   I have personally reviewed and noted the following in the patient's chart:   Medical and social history Use of alcohol, tobacco or illicit drugs  Current medications and supplements Functional ability and status Nutritional status Physical activity Advanced directives List of other physicians Hospitalizations, surgeries, and ER visits in previous 12 months Vitals Screenings to include cognitive, depression, and falls Referrals and appointments  In addition, I have reviewed and discussed with Cindy [name] certain preventive protocols, quality metrics, and best practice recommendations. A written personalized care plan for preventive services as well as general preventive health recommendations is available and can be mailed to the patient at her request.      Tinnie Gens, RN BSN  12/28/2022

## 2022-12-28 NOTE — Patient Instructions (Signed)
[facility] Maintenance Summary and Written Plan of Care  Ms. Cindy [name] ,  Thank you for allowing me to perform your Medicare Annual Wellness Visit and for your ongoing commitment to your health.   Health Maintenance & Immunization History Health Maintenance  Topic Date Due   MAMMOGRAM  07/16/2023   Medicare Annual Wellness (AWV)  12/29/2023   DTaP/Tdap/Td (4 - Td or Tdap) 04/05/2028   COLONOSCOPY (Pts 45-36yr Insurance coverage will need to be confirmed)  08/28/2029   Pneumonia Vaccine 70 Years old  Completed   INFLUENZA VACCINE  Completed   DEXA SCAN  Completed   COVID-19 Vaccine  Completed   Hepatitis C Screening  Completed   Zoster Vaccines- Shingrix  Completed   HPV VACCINES  Aged Out   Immunization History  Administered Date(s) Administered   COVID-19, mRNA, vaccine(Comirnaty)70 years and older 12/03/2022   Hepatitis B 10/19/2011   Influenza Split 09/27/2013   Influenza, High Dose Seasonal PF 08/30/2019   Influenza,inj,Quad PF,6+ Mos 09/17/2014, 09/10/2016   Influenza,inj,Quad PF,6-35 Mos 10/11/2015   Influenza-Unspecified 10/07/2017, 08/30/2019, 09/13/2020, 09/04/2021, 09/10/2022   Moderna Covid-19 Vaccine Bivalent Booster 70yr& up 04/22/2022   Moderna Sars-Covid-2 Vaccination 01/12/2020, 01/31/2020, 10/30/2020, 08/26/2021   Pneumococcal Conjugate-13 04/05/2018   Pneumococcal Polysaccharide-23 05/22/2019   Td 04/07/2005, 12/15/2007   Tdap 04/05/2018   Zoster Recombinat (Shingrix) 06/11/2018, 08/30/2018   Zoster, Live 12/15/2007    These are the patient goals that we discussed:  Goals Addressed               This Visit's Progress     Patient Stated (pt-stated)        12/28/2022 AWV Goal: Exercise for General Health  Patient will verbalize understanding of the benefits of increased physical activity: Exercising regularly is important. It will improve your overall fitness, flexibility, and endurance. Regular exercise also will  improve your overall health. It can help you control your weight, reduce stress, and improve your bone density. Over the next year, patient will increase physical activity as tolerated with a goal of at least 150 minutes of moderate physical activity per week.  You can tell that you are exercising at a moderate intensity if your heart starts beating faster and you start breathing faster but can still hold a conversation. Moderate-intensity exercise ideas include: Walking 1 mile (1.6 km) in about 15 minutes Biking Hiking Golfing Dancing Water aerobics Patient will verbalize understanding of everyday activities that increase physical activity by providing examples like the following: Yard work, such as: PuSales promotion account executiveardening Washing windows or floors Patient will be able to explain general safety guidelines for exercising:  Before you start a new exercise program, talk with your health care provider. Do not exercise so much that you hurt yourself, feel dizzy, or get very short of breath. Wear comfortable clothes and wear shoes with good support. Drink plenty of water while you exercise to prevent dehydration or heat stroke. Work out until your breathing and your heartbeat get faster.          This is a list of Health Maintenance Items that are overdue or due now: There are no preventive care reminders to display for this patient.    Orders/Referrals Placed Today: No orders of the defined types were placed in this encounter.  (Contact our referral department at [phone] you have not spoken with someone about your referral appointment within  the next 5 days)    Follow-up Plan Follow-up with Luetta Nutting, DO as planned Medicare wellness visit in one year.  Patient will acces AVS on my chart.      Health Maintenance, Female Adopting a healthy lifestyle and getting preventive care  are important in promoting health and wellness. Ask your health care provider about: The right schedule for you to have regular tests and exams. Things you can do on your own to prevent diseases and keep yourself healthy. What should I know about diet, weight, and exercise? Eat a healthy diet  Eat a diet that includes plenty of vegetables, fruits, low-fat dairy products, and lean protein. Do not eat a lot of foods that are high in solid fats, added sugars, or sodium. Maintain a healthy weight Body mass index (BMI) is used to identify weight problems. It estimates body fat based on height and weight. Your health care provider can help determine your BMI and help you achieve or maintain a healthy weight. Get regular exercise Get regular exercise. This is one of the most important things you can do for your health. Most adults should: Exercise for at least 150 minutes each week. The exercise should increase your heart rate and make you sweat (moderate-intensity exercise). Do strengthening exercises at least twice a week. This is in addition to the moderate-intensity exercise. Spend less time sitting. Even light physical activity can be beneficial. Watch cholesterol and blood lipids Have your blood tested for lipids and cholesterol at 70 years of age, then have this test every 5 years. Have your cholesterol levels checked more often if: Your lipid or cholesterol levels are high. You are older than 70 years of age. You are at high risk for heart disease. What should I know about cancer screening? Depending on your health history and family history, you may need to have cancer screening at various ages. This may include screening for: Breast cancer. Cervical cancer. Colorectal cancer. Skin cancer. Lung cancer. What should I know about heart disease, diabetes, and high blood pressure? Blood pressure and heart disease High blood pressure causes heart disease and increases the risk of stroke.  This is more likely to develop in people who have high blood pressure readings or are overweight. Have your blood pressure checked: Every 3-5 years if you are 34-70 years of age. Every year if you are 70 years old or older. Diabetes Have regular diabetes screenings. This checks your fasting blood sugar level. Have the screening done: Once every three years after age 52 if you are at a normal weight and have a low risk for diabetes. More often and at a younger age if you are overweight or have a high risk for diabetes. What should I know about preventing infection? Hepatitis B If you have a higher risk for hepatitis B, you should be screened for this virus. Talk with your health care provider to find out if you are at risk for hepatitis B infection. Hepatitis C Testing is recommended for: Everyone born from [birth year] through 1963. Anyone with known risk factors for hepatitis C. Sexually transmitted infections (STIs) Get screened for STIs, including gonorrhea and chlamydia, if: You are sexually active and are younger than 70 years of age. You are older than 70 years of age and your health care provider tells you that you are at risk for this type of infection. Your sexual activity has changed since you were last screened, and you are at increased risk for chlamydia or gonorrhea. Ask  your health care provider if you are at risk. Ask your health care provider about whether you are at high risk for HIV. Your health care provider may recommend a prescription medicine to help prevent HIV infection. If you choose to take medicine to prevent HIV, you should first get tested for HIV. You should then be tested every 3 months for as long as you are taking the medicine. Pregnancy If you are about to stop having your period (premenopausal) and you may become pregnant, seek counseling before you get pregnant. Take 400 to 800 micrograms (mcg) of folic acid every day if you become pregnant. Ask for birth control  (contraception) if you want to prevent pregnancy. Osteoporosis and menopause Osteoporosis is a disease in which the bones lose minerals and strength with aging. This can result in bone fractures. If you are 21 years old or older, or if you are at risk for osteoporosis and fractures, ask your health care provider if you should: Be screened for bone loss. Take a calcium or vitamin D supplement to lower your risk of fractures. Be given hormone replacement therapy (HRT) to treat symptoms of menopause. Follow these instructions at home: Alcohol use Do not drink alcohol if: Your health care provider tells you not to drink. You are pregnant, may be pregnant, or are planning to become pregnant. If you drink alcohol: Limit how much you have to: 0-1 drink a day. Know how much alcohol is in your drink. In the [place] one drink equals one 12 oz bottle of beer (355 mL), one 5 oz glass of wine (148 mL), or one 1 oz glass of hard liquor (44 mL). Lifestyle Do not use any products that contain nicotine or tobacco. These products include cigarettes, chewing tobacco, and vaping devices, such as e-cigarettes. If you need help quitting, ask your health care provider. Do not use street drugs. Do not share needles. Ask your health care provider for help if you need support or information about quitting drugs. General instructions Schedule regular health, dental, and eye exams. Stay current with your vaccines. Tell your health care provider if: You often feel depressed. You have ever been abused or do not feel safe at home. Summary Adopting a healthy lifestyle and getting preventive care are important in promoting health and wellness. Follow your health care provider's instructions about healthy diet, exercising, and getting tested or screened for diseases. Follow your health care provider's instructions on monitoring your cholesterol and blood pressure. This information is not intended to replace advice given  to you by your health care provider. Make sure you discuss any questions you have with your health care provider. Document Revised: 04/21/2021 Document Reviewed: 04/21/2021 Elsevier Patient Education  [place].

## 2023-01-07 ENCOUNTER — Encounter: Payer: Self-pay | Admitting: Family Medicine

## 2023-01-11 ENCOUNTER — Encounter: Payer: Self-pay | Admitting: Family Medicine

## 2023-01-12 ENCOUNTER — Other Ambulatory Visit: Payer: Self-pay | Admitting: Family Medicine

## 2023-01-29 ENCOUNTER — Encounter: Payer: Self-pay | Admitting: Emergency Medicine

## 2023-01-29 ENCOUNTER — Ambulatory Visit
Admission: EM | Admit: 2023-01-29 | Discharge: 2023-01-29 | Disposition: A | Discharge status: Home / Self Care | Payer: Medicare Other | Attending: Emergency Medicine | Admitting: Emergency Medicine

## 2023-01-29 DIAGNOSIS — R300 Dysuria: Secondary | ICD-10-CM | POA: Diagnosis not present

## 2023-01-29 LAB — POCT URINALYSIS DIP (MANUAL ENTRY)
Bilirubin, UA: NEGATIVE
Glucose, UA: NEGATIVE mg/dL
Ketones, POC UA: NEGATIVE mg/dL
Nitrite, UA: NEGATIVE
Protein Ur, POC: 30 mg/dL — AB
Spec Grav, UA: 1.02 (ref 1.010–1.025)
Urobilinogen, UA: 0.2 E.U./dL
pH, UA: 6.5 (ref 5.0–8.0)

## 2023-01-29 MED ORDER — CEPHALEXIN 500 MG PO CAPS: 500.0000 mg | ORAL_CAPSULE | Freq: Two times a day (BID) | ORAL | 0 refills | Status: DC

## 2023-01-29 NOTE — ED Triage Notes (Signed)
Dysuria - started 2 hours ago

## 2023-01-29 NOTE — Discharge Instructions (Addendum)
Please take medication as prescribed. Take with food to avoid upset stomach. We will call you if your culture does not grow any bacteria (1-2 days)  Drink lots of fluids!

## 2023-01-29 NOTE — ED Provider Notes (Signed)
[facility]    CSN: [id number] Arrival date & time: 01/29/23  [place]     History   Chief Complaint Chief Complaint  Patient presents with   Dysuria    HPI DENEICE BRAUTIGAN is a 72 y.o. female.  History is unclear - patient with cognitive impairment  Possible 2 hour history of dysuria. Reports urinary frequency No abdominal pain, nausea/vomiting. Normal BM No fever, chills, flank pain  She has hx of frequency and cystitis - takes daily trimethoprim from urology   Past Medical History:  Diagnosis Date   Arthritis    Cancer ([place])    breast   Depression    GERD (gastroesophageal reflux disease)    Hyperlipidemia    Tendonitis     Patient Active Problem List   Diagnosis Date Noted   Mild cognitive impairment with memory loss 12/16/2022   Sebaceous cyst of labia 01/13/2022   MDD (major depressive disorder), recurrent episode ([place]) 05/23/2020   Urinary frequency 11/16/2019   Cystitis 10/30/2019   Hypermetropia of both eyes 01/11/2018   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      Home Medications    Prior to Admission medications   Medication Sig Start Date End Date Taking? Authorizing Provider  cephALEXin (KEFLEX) 500 MG capsule Take 1 capsule (500 mg total) by mouth 2 (two) times daily for 5 days. 01/29/23 02/03/23 Yes Hikari Tripp, Wells Guiles, PA-C  acyclovir cream (ZOVIRAX) 5 % Apply 1 application topically every 3 (three) hours. Patient taking differently: Apply 1 application  topically as needed. 08/04/20    Luetta Nutting, DO  buPROPion (WELLBUTRIN XL) 150 MG 24 hr tablet TAKE 3 TABLETS EVERY DAY 02/23/22   Luetta Nutting, DO  Calcium Carbonate-Vit D-Min (CALCIUM 1200 PO) Take 1 tablet by mouth daily.    [provider]  clonazePAM (KLONOPIN) 1 MG tablet TAKE 1 TABLET TWICE DAILY 01/12/23   Luetta Nutting, DO  dicyclomine (BENTYL) 20 MG tablet TAKE 1 TABLET THREE TIMES DAILY AS NEEDED 05/15/22   Luetta Nutting, DO  donepezil (ARICEPT) 5 MG tablet Take 1 tablet (5 mg total) by mouth at bedtime. 12/17/22   Luetta Nutting, DO  lovastatin (MEVACOR) 20 MG tablet TAKE 1 TABLET (20 MG TOTAL) BY MOUTH AT BEDTIME. 06/01/22   Luetta Nutting, DO  Multiple Vitamin (MULTIVITAMIN) tablet Take 1 tablet by mouth daily.    [provider]  omeprazole (PRILOSEC) 40 MG capsule TAKE 1 CAPSULE EVERY DAY 07/27/22   Luetta Nutting, DO  PARoxetine (PAXIL) 20 MG tablet TAKE 2 TABLETS EVERY DAY 08/11/22   Luetta Nutting, DO  polyethylene glycol (MIRALAX / GLYCOLAX) 17 g packet Take 17 g by mouth as needed.    [provider]  trimethoprim (TRIMPEX) 100 MG tablet TAKE 1 TABLET EVERY DAY 05/15/22   Luetta Nutting, DO  valACYclovir (VALTREX) 500 MG tablet TAKE 1 TABLET EVERY DAY 12/01/21   Luetta Nutting, DO    Family History Family History  Problem Relation Age of Onset   Cancer  Mother        bone   Hypertension Father    Diabetes Father    Stroke Father    Hypertension Sister    Alzheimer's disease Brother    Hypertension Sister    Colon cancer Neg Hx    Esophageal cancer Neg Hx    Rectal cancer Neg Hx    Stomach cancer Neg Hx     Social History Social History   Tobacco Use   Smoking status: Never   Smokeless tobacco: Never  Vaping Use   Vaping Use: Never used  Substance Use Topics   Alcohol use: Not Currently    Comment: rarely   Drug use: No     Allergies   Patient has no known allergies.   Review of Systems Review of Systems As per HPI  Physical Exam Triage Vital  Signs ED Triage Vitals  Enc Vitals Group     BP 01/29/23 1714 (!) 143/82     Pulse Rate 01/29/23 1714 75     Resp 01/29/23 1714 16     Temp 01/29/23 1714 98 F (36.7 C)     Temp Source 01/29/23 1714 Oral     SpO2 01/29/23 1714 99 %     Weight 01/29/23 1717 168 lb (76.2 kg)     Height 01/29/23 1717 5' 8"$  (1.727 m)     Head Circumference --      Peak Flow --      Pain Score 01/29/23 1716 2     Pain Loc --      Pain Edu? --      Excl. in [place]? --    No data found.  Updated Vital Signs BP (!) 143/82 (BP Location: Left Arm)   Pulse 75   Temp 98 F (36.7 C) (Oral)   Resp 16   Ht 5' 8"$  (1.727 m)   Wt 168 lb (76.2 kg)   SpO2 99%   BMI 25.54 kg/m    Physical Exam Vitals and nursing note reviewed.  Constitutional:      General: She is not in acute distress.    Appearance: She is not ill-appearing.  HENT:     Mouth/Throat:     Mouth: Mucous membranes are moist.     Pharynx: Oropharynx is clear.  Cardiovascular:     Rate and Rhythm: Normal rate and regular rhythm.  Pulmonary:     Effort: Pulmonary effort is normal.  Abdominal:     Tenderness: There is no abdominal tenderness. There is no right CVA tenderness, left CVA tenderness, guarding or rebound.  Skin:    General: Skin is warm and dry.  Neurological:     General: No focal deficit present.     Mental Status: She is alert.     UC Treatments / Results  Labs (all labs ordered are listed, but only abnormal results are displayed) Labs Reviewed  POCT URINALYSIS DIP (MANUAL ENTRY) - Abnormal; Notable for the following components:      Result Value   Clarity, UA cloudy (*)    Blood, UA small (*)    Protein Ur, POC =30 (*)    Leukocytes, UA Moderate (2+) (*)    All other components within normal limits  URINE CULTURE  POCT URINALYSIS DIP (MANUAL ENTRY)    EKG  Radiology No results found.  Procedures Procedures (including critical care time)  Medications Ordered in UC Medications - No data to  display  Initial Impression / Assessment and Plan / UC  Course  I have reviewed the triage vital signs and the nursing notes.  Pertinent labs & imaging results that were available during my care of the patient were reviewed by me and considered in my medical decision making (see chart for details).  UA with moderate leuks, some blood Will cover for acute cystitis given pt history. Keflex BID x 5 Urine culture pending She will follow with PCP/urology if symptoms persist  Return precautions discussed. Patient agrees to plan  Final Clinical Impressions(s) / UC Diagnoses   Final diagnoses:  Dysuria     Discharge Instructions      Please take medication as prescribed. Take with food to avoid upset stomach. We will call you if your culture does not grow any bacteria (1-2 days)  Drink lots of fluids!     ED Prescriptions     Medication Sig Dispense Auth. Provider   cephALEXin (KEFLEX) 500 MG capsule Take 1 capsule (500 mg total) by mouth 2 (two) times daily for 5 days. 10 capsule Nayali Talerico, Wells Guiles, PA-C      PDMP not reviewed this encounter.   Les Pou, [place] 01/29/23 1807

## 2023-02-01 ENCOUNTER — Telehealth (HOSPITAL_COMMUNITY): Payer: Self-pay | Admitting: Emergency Medicine

## 2023-02-01 ENCOUNTER — Telehealth: Payer: Self-pay

## 2023-02-01 LAB — URINE CULTURE: Culture: 60000 — AB

## 2023-02-01 MED ORDER — NITROFURANTOIN MONOHYD MACRO 100 MG PO CAPS: 100.0000 mg | ORAL_CAPSULE | Freq: Two times a day (BID) | ORAL | 0 refills | Status: DC

## 2023-02-01 MED ORDER — NITROFURANTOIN MONOHYD MACRO 100 MG PO CAPS: 100.0000 mg | ORAL_CAPSULE | Freq: Two times a day (BID) | ORAL | 0 refills | Status: AC

## 2023-02-01 NOTE — Telephone Encounter (Signed)
Changed pharmacy per patient request.

## 2023-02-01 NOTE — Telephone Encounter (Signed)
Pt lvm stating UTI. Requesting medications be called in. Please advise.

## 2023-02-01 NOTE — Telephone Encounter (Signed)
Sent home on keflex. Sensitivity resulted - resistant to cephalosporins. D/C keflex. Sent macrobid BID x 5 days. Good kidney function.

## 2023-02-01 NOTE — Telephone Encounter (Signed)
Please have her send message with symptoms via mychart and we can do a mychart visit and have her drop off a urine specimen.    Thanks! CM

## 2023-02-02 NOTE — Telephone Encounter (Signed)
Pt has been treated at [facility]. States they took care of her.

## 2023-02-10 ENCOUNTER — Encounter: Payer: Self-pay | Admitting: Neurology

## 2023-02-10 ENCOUNTER — Telehealth: Payer: Self-pay | Admitting: *Deleted

## 2023-02-10 ENCOUNTER — Ambulatory Visit (INDEPENDENT_AMBULATORY_CARE_PROVIDER_SITE_OTHER): Payer: Medicare Other | Admitting: Neurology

## 2023-02-10 VITALS — BP 128/77 | HR 75 | Ht 68.0 in | Wt 169.8 lb

## 2023-02-10 DIAGNOSIS — R4789 Other speech disturbances: Secondary | ICD-10-CM | POA: Diagnosis not present

## 2023-02-10 DIAGNOSIS — R6889 Other general symptoms and signs: Secondary | ICD-10-CM

## 2023-02-10 DIAGNOSIS — G3184 Mild cognitive impairment, so stated: Secondary | ICD-10-CM

## 2023-02-10 MED ORDER — DONEPEZIL HCL 10 MG PO TABS: 10.0000 mg | ORAL_TABLET | Freq: Every day | ORAL | 3 refills | Status: DC

## 2023-02-10 NOTE — Telephone Encounter (Signed)
Called and LMVM for [facility] to ask about follow up eval results on neuropsych eval.

## 2023-02-10 NOTE — Patient Instructions (Signed)
It was nice to see you again today.  I am glad you are able to tolerate low-dose donepezil, as discussed, we will increase it to 10 mg once daily as of right now.   Your workup with your brain scan and labs were quite benign.  We will have you follow-up in about a year to see one of our nurse practitioners.  Please try to stay active mentally and physically, hydrate well with water and rest well.  Exercising regularly may help as well.

## 2023-02-10 NOTE — Telephone Encounter (Signed)
Sandy RN calling from [facility] in reference to a f/u consult for Mrs. Sallas. She says they can see the initial consult but doesn't see a f/u in the chart. Please contact [place] @ [phone].

## 2023-02-10 NOTE — Progress Notes (Signed)
Subjective:    Patient ID: Cindy [name] is a 72 y.o. female.  HPI    Interim history:   Cindy [name] is a 72 year old right-handed woman with an underlying medical history of reflux disease, hyperlipidemia, breast cancer, arthritis, anxiety, depression, and borderline overweight state, who presents for follow-up consultation of her memory loss.  The patient is unaccompanied today.  I first met her at the request of her primary care physician on 08/10/2022, at which time she reported an approximately 2-year history of forgetfulness, also difficulty with name recall.  Her MMSE was 29 out of 30 at the time.  We proceeded with further workup in the form of blood work which was benign including B12, ESR, TSH, inflammatory markers, A1c was on the high end of normal.  She had a brain MRI with and without contrast on 08/27/2022 and I reviewed the results:  IMPRESSION: This MRI of the brain with and without contrast shows the following:  1. Brain volume was normal for age though compared to the CT scan from 2017 there has been mild atrophy. 2. T2/FLAIR hypertense foci in the subcortical and deep white matter of the cerebral hemispheres, predominantly in the parietal lobes. 3. No acute findings.  Normal enhancement pattern.   She was also advised to seek evaluation through neuropsychology.  She saw Dr. Sima Matas on 10/22/2022 for consultation and initial interview and had formal testing through his office on 11/10/2022 with Otilio Jefferson.  She has not had a formal follow-up appointment yet with Dr. Sima Matas.  Today, 02/10/2023: She reports doing well, started generic Aricept 5 mg once daily in early January after her primary care and I had a conversation about starting her on something for memory.  She tolerates the donepezil, she would like to see if we can go up to the 10 mg as she feels that it has actually helped, she feels less forgetful and is able to retain information a little bit better especially  after conversation such as phone conversations and relating the information back to her husband for example.  She had some initial sleepiness from the medication but tolerates it well now, no GI side effects.  She tries to stay active, is more active in the summertime as she works in the yard, she is intending to join a gym in [place].  She does not currently have a gym membership.  She hydrates well with water, avoids excess caffeine, limits herself to 1 cup of coffee in the morning, rare if any alcohol.  The patient's allergies, current medications, family history, past medical history, past social history, past surgical history and problem list were reviewed and updated as appropriate.   Previously:   08/10/22: (She) reports an approx. 2 year history of cognitive issues including forgetfulness, difficulty with name recall including people she has known for years.  She has had some trouble with concentration and multitasking.  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  She has a family history of Alzheimer's dementia affecting her brother who passed away at the age of 75 or 63.  He was in a long-term facility for about 6 years prior to his passing and his symptoms of memory issues started in his late 95s or early 81s.  She has had recent stressors in the past 2 to 3 years.  She was very close to her great niece who passed away from melanoma.  She had young kids at the time, she has less  contact with her 3 young children now after her passing.  She is the youngest of 4 kids altogether.  Her oldest sister is around 42 years old and is in a retirement community, her second sister is 3 years old and has arthritis but no cognitive issues.  She tries to stay active, enjoys singing in the church choir.  She admits that she does not really exercise in any formal way.  She sleeps well, no evidence of snoring or apneas per husband, no significant gasping at night.  She tries to hydrate  well with water, does not drink caffeine daily and rare alcohol.  She is a non-smoker.  She has not fallen, no sudden onset of one-sided weakness or numbness or tingling or droopy face or slurring of speech, no recurrent headaches.  She lives with her husband who had 2 sons, 1 son passed away.  She herself never had any children but has been very close to her nieces and nephew, her brother had 4 children, 1 son and 3 daughters.  Her nephew's daughter was the one she was very close with.  Overall, she comes from a very tight knit family, [place]      Her Past Medical History Is Significant For: Past Medical History:  Diagnosis Date   Arthritis    Cancer ([place])    breast   Depression    GERD (gastroesophageal reflux disease)    Hyperlipidemia    Tendonitis     Her Past Surgical History Is Significant For: Past Surgical History:  Procedure Laterality Date   BREAST SURGERY     right   ELBOW SURGERY     right    Her Family History Is Significant For: Family History  Problem Relation Age of Onset   Cancer Mother        bone   Hypertension Father    Diabetes Father    Stroke Father    Hypertension Sister    Alzheimer's disease Brother    Hypertension Sister    Colon cancer Neg Hx    Esophageal cancer Neg Hx    Rectal cancer Neg Hx    Stomach cancer Neg Hx     Her Social History Is Significant For: Social History   Socioeconomic History   Marital status: Married    Spouse name: [name]   Number of children: 0   Years of education: 18   Highest education level: Master's degree (e.g., MA, MS, MEng, MEd, MSW, MBA)  Occupational History   Occupation: Retired.  Tobacco Use   Smoking status: Never   Smokeless tobacco: Never  Vaping Use   Vaping Use: Never used  Substance and Sexual Activity   Alcohol use: Not Currently    Comment: rarely   Drug use: No   Sexual activity: Yes    Birth control/protection: Post-menopausal  Other Topics Concern   Not on  file  Social History Narrative   Lives with her husband. They help take care of her brother's grandchildren. She enjoys singing in the choir and gardening.   Social Determinants of Health   Financial Resource Strain: Low Risk  (12/28/2022)   Overall Financial Resource Strain (CARDIA)    Difficulty of Paying Living Expenses: Not hard at all  Food Insecurity: No Food Insecurity (12/28/2022)   Hunger Vital Sign    Worried About Running Out of Food in the Last Year: Never true    Ran Out of Food in the Last Year: Never  true  Transportation Needs: No Transportation Needs (12/28/2022)   PRAPARE - Hydrologist (Medical): No    Lack of Transportation (Non-Medical): No  Physical Activity: Inactive (12/28/2022)   Exercise Vital Sign    Days of Exercise per Week: 0 days    Minutes of Exercise per Session: 0 min  Stress: No Stress Concern Present (12/28/2022)   [place]    Feeling of Stress : Not at all  Social Connections: [place] (12/28/2022)   Social Connection and Isolation Panel [NHANES]    Frequency of Communication with Friends and Family: More than three times a week    Frequency of Social Gatherings with Friends and Family: More than three times a week    Attends Religious Services: More than 4 times per year    Active Member of [employer] or Organizations: Yes    Attends Music therapist: More than 4 times per year    Marital Status: Married    Her Allergies Are:  No Known Allergies:   Her Current Medications Are:  Outpatient Encounter Medications as of 02/10/2023  Medication Sig   acyclovir cream (ZOVIRAX) 5 % Apply 1 application topically every 3 (three) hours. (Patient taking differently: Apply 1 application  topically as needed.)   buPROPion (WELLBUTRIN XL) 150 MG 24 hr tablet TAKE 3 TABLETS EVERY DAY   Calcium Carbonate-Vit D-Min (CALCIUM 1200 PO) Take 1 tablet by  mouth daily.   clonazePAM (KLONOPIN) 1 MG tablet TAKE 1 TABLET TWICE DAILY (Patient taking differently: as needed.)   dicyclomine (BENTYL) 20 MG tablet TAKE 1 TABLET THREE TIMES DAILY AS NEEDED   donepezil (ARICEPT) 5 MG tablet Take 1 tablet (5 mg total) by mouth at bedtime.   lovastatin (MEVACOR) 20 MG tablet TAKE 1 TABLET (20 MG TOTAL) BY MOUTH AT BEDTIME.   Multiple Vitamin (MULTIVITAMIN) tablet Take 1 tablet by mouth daily.   omeprazole (PRILOSEC) 40 MG capsule TAKE 1 CAPSULE EVERY DAY   PARoxetine (PAXIL) 20 MG tablet TAKE 2 TABLETS EVERY DAY   polyethylene glycol (MIRALAX / GLYCOLAX) 17 g packet Take 17 g by mouth as needed.   trimethoprim (TRIMPEX) 100 MG tablet TAKE 1 TABLET EVERY DAY (Patient taking differently: as needed.)   valACYclovir (VALTREX) 500 MG tablet TAKE 1 TABLET EVERY DAY (Patient taking differently: as needed.)   No facility-administered encounter medications on file as of 02/10/2023.  :  Review of Systems:  Out of a complete 14 point review of systems, all are reviewed and negative with the exception of these symptoms as listed below:  Review of Systems  Neurological:        Pt here for memory f/u  Pt states PCP wants her to discuss changing dosage for Aricept to 10 mg nightly. Pt states short term memory is a little better Pt states had covid 3 weeks ago     Objective:  Neurological Exam  Physical Exam Physical Examination:   Vitals:   02/10/23 0727  BP: 128/77  Pulse: 75    General Examination: The patient is a very pleasant 72 y.o. female in no acute distress. She appears well-developed and well-nourished and well groomed.   HEENT: Normocephalic, atraumatic, pupils are equal, round and reactive to light, extraocular tracking is good without limitation to gaze excursion or nystagmus noted.  Corrective eyeglasses in place.  Hearing is grossly intact. Face is symmetric with normal facial animation. Speech is clear with no  dysarthria noted. There is no  hypophonia. There is no lip, neck/head, jaw or voice tremor. Neck is supple with full range of passive and active motion. There are no carotid bruits on auscultation.    Chest: Clear to auscultation without wheezing, rhonchi or crackles noted.   Heart: S1+S2+0, regular and normal without murmurs, rubs or gallops noted.    Abdomen: Soft, non-tender and non-distended.   Extremities: There is no pitting edema in the distal lower extremities bilaterally.    Skin: Warm and dry without trophic changes noted.    Musculoskeletal: exam reveals no obvious joint deformities.    Neurologically:  Mental status: The patient is awake, alert and oriented in all 4 spheres. Her immediate and remote memory, attention, language skills and fund of knowledge are appropriate. There is no evidence of aphasia, agnosia, apraxia or anomia. Speech is clear with normal prosody and enunciation. Thought process is linear. Mood is normal and affect is normal.      02/10/2023    7:30 AM 08/10/2022    8:14 AM  MMSE - Mini Mental State Exam  Orientation to time 5 5  Orientation to Place 5 5  Registration 3 3  Attention/ Calculation 3 4  Recall 2 3  Language- name 2 objects 2 2  Language- repeat 1 1  Language- follow 3 step command 3 3  Language- read & follow direction 1 1  Write a sentence 1 1  Copy design 1 1  Total score 27 29    On 08/10/2022: CDT: 4/4, AFT: 13/min. On 02/10/2023: CDT: 4/4, AFT: 10/min.   Cranial nerves II - XII are as described above under HEENT exam.  Motor exam: Normal bulk, strength and tone is noted. There is no action or resting tremor.  Fine motor skills and coordination: Grossly intact. Cerebellar testing: No dysmetria or intention tremor. There is no truncal or gait ataxia.  Sensory exam: intact to light touch in the upper and lower extremities.  Gait, station and balance: She stands easily. No veering to one side is noted. No leaning to one side is noted. Posture is age-appropriate  and stance is narrow based. Gait shows normal stride length and normal pace. No problems turning are noted.    Assessment and Plan:    In summary, Cindy [name] is a very pleasant 72 year old female with an underlying medical history of reflux disease, hyperlipidemia, breast cancer, arthritis, anxiety, depression, and borderline overweight state, who presents for follow-up consultation of her memory loss.  History and findings are in keeping with mild memory loss, likely mild cognitive impairment.  She had evaluation through neuropsychology.  I will review the formal report when available.  She has a follow-up appointment with Dr. Sima Matas later this year.  She started low-dose donepezil in early January 2024 and has been able to tolerate it.  We mutually agreed to increase it to 10 mg once daily at this point.  Workup with labs in August 2023 was benign, brain MRI from September 2023 was largely age-appropriate.  We will continue to monitor, we talked about her test results, we talked about maintaining a healthy lifestyle.  She is encouraged to add some formal exercise to her day-to-day activities.  She is encouraged to stay well-hydrated with water, avoid caffeine, avoid alcohol and stay mentally active as well.  She is advised to follow-up in this clinic to see one of our nurse practitioners routinely in 1 year.  I answered all her questions today and she  was in agreement.  I spent 40 minutes in total face-to-face time and in reviewing records during pre-charting, more than 50% of which was spent in counseling and coordination of care, reviewing test results, reviewing medications and treatment regimen and/or in discussing or reviewing the diagnosis of memory loss, the prognosis and treatment options. Pertinent laboratory and imaging test results that were available during this visit with the patient were reviewed by me and considered in my medical decision making (see chart for details).

## 2023-02-11 NOTE — Progress Notes (Signed)
Neuropsychological Evaluation   Patient:  Cindy [name]   DOB: 15-Aug-1951  MR Number: [id number]  Location: [facility] [place], [place] [id number] [facility] [place] [address] Dept: [phone]  Start: 4 PM End: 5 PM  Provider/Observer:     Edgardo Roys PsyD  Chief Complaint:      Chief Complaint  Patient presents with   Memory Loss   Anxiety   Depression   Other    Word Finding Difficulties    Reason For Service:     MAJA COLANDREA is a 72 year old female referred for neuropsychological evaluation by her treating neurologist Star Age, MD due to increasing memory difficulties and memory loss with the family history of dementia and one of her siblings was diagnosed in the sibling's early 45s.  Patient has a past history of depression that developed after failed marriage in the late 1980s and was also exacerbated after a diagnosis and treatment for breast cancer in 1999.  Patient has a past medical history including reflux disease, hyperlipidemia, breast cancer, arthritis, anxiety, depression, overweight status.  Patient has been describing memory difficulties and other cognitive issues for a little over a year now.  Primary memory issues have to do with difficulty remembering names of people and objects particular people that she has known for years.  Patient has also described with issues related to concentration and attention.   During the clinical interview today, the patient reports that she really started noticing significant memory issues over the past 8 to 10 months and that she will see someone that she has done very well and cannot recall their name.  She does report that this tends to clear up and improve at times.  The patient is concerned about these changes as she has a brother with a history of diagnosis of Alzheimer's dementia who passed away at age 43.  He was  diagnosed initially in his early 36s.  He spent 7 years in a nursing home ultimately dying of pneumonia and Alzheimer symptoms.  The patient is the youngest of 4 children with her oldest sister being 26 years old and living in a retirement community and her second oldest sister is 43 years old with arthritis but no cognitive issues.   The patient denies any visual hallucinations, tremors or other big/major medical issues.  Current medications include Wellbutrin and Paxil.  Patient reports that she loves to sleep and also take some naps during the day.  The patient does not have any symptoms that would suggest obstructive sleep apnea or other pulmonary issues.   The patient has had significant stressors over the past several years with one of them being a great niece who she was very close to passing away from melanoma.  The patient also was close to the niece's young children and the patient has had less contact the niece's 3 young children after her great niece passed away.  The patient reports that her other psychosocial stressors within the family as well as the patient and her husband having to take care of some aspects with her children.  She describes her husband as very helpful and he is often encouraging her to do things to help with her depression.   Patient had a brain MRI with and without contrast performed on 08/27/2022 and interpreted by Arlice Colt, MD. impressions from this MRI showed that brain volume was normal for age and was compared  to a 2017 CT scan suggesting some mild atrophy.  T2/FLAIR hyperintense foci in the subcortical and deep white matter and subcortical regions, parietal lobes and more in the frontal lobes.  There was no acute findings.    Tests Administered: Controlled Oral Word Association Test (COWAT; FAS & Animals)  Wechsler Adult Intelligence Scale, 4th Edition (WAIS-IV) Wechsler Memory Scale, 4th Edition (WMS-IV); Older Adult Battery  Participation  Level:   Active  Participation Quality:  Appropriate      Behavioral Observation:  The patient appeared well-groomed and appropriately dressed. Her manners were polite and appropriate to the situation. The patient's attitude toward testing was positive, her effort was good, and she was compliant with all testing instructions. The patient did have mild difficulty understanding/remembering instructions and required some repetition.    Well Groomed, Alert, and Appropriate.   Test Results:   Initially, an estimation was made as to the patient's historic/premorbid intellectual and cognitive functioning.  The patient completed her associates degree in nursing and then attended [employer] to obtain her BSN in nursing.  Patient was in the honor society.  Patient also attended [employer] to complete a dual masters program with an Microbiologist in Network engineer.  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  It is estimated that the patient performed in the high average to superior range of intellectual and cognitive functioning historically and we will utilize a conservative estimation standpoint of roughly 1 standard deviation above normative population for comparison purposes to current assessment measures.  COWAT FAS Total = 43 Z = 0.08 Animals Total = 14 Z = -1   The patient is described issues with word finding and fluency issues.  The patient was administered the control or word association test to arrive a standardized normed assessment of verbal fluency for comparison against age, gender and education matched control group.  On the FAS portion, which assesses lexical fluency the patient produced 43 total words during the 3 trials.  This performances at the upper end of the average range relative to her normative comparison group  performing nearly 1 standard deviation above normative group.  On the animal naming test the patient showed more difficulties performing 1 standard deviation below her normative group.  This assesses semantic fluency where more self-directed targeted naming is required.  There does not appear to be significant expressive language deficits present from both objective assessment as well as clinical observations although consistent with the patient's reports there were more difficulties with bringing up targeted names such as individuals names or object names but straight lexical fluency was well-preserved.       WAIS-IV             Composite Score Summary        Scale Sum of Scaled Scores Composite Score Percentile Rank 95% Conf. Interval Qualitative Description  Verbal Comprehension 24 VCI 89 23 84-95 Low Average  Perceptual Reasoning 20 PRI 81 10 76-88 Low Average  Working Memory 12 WMI 77 6 72-85 Borderline  Processing Speed 18 PSI 94 34 86-103 Average  Full Scale 74 FSIQ 82 12 78-86 Low Average  General Ability 44 GAI 82 12 78-87 Low Average    The patient was administered the Wechsler Adult Intelligence Scale-IV 2 provide an objective assessment of a wide range of varying cognitive domains with widely available measures allowing for repeat testing  if needed.  As the patient's premorbid functioning suggest that she was at least in the upper end of the average range if not significantly higher the 2 global composite scores produced did indicate significant change in global cognitive functioning from premorbid capacity.  The patient produced a full-scale IQ score of 82 which falls at the 12th percentile and is in the low average range and a similar general abilities index score of 82.  This suggest 1 or more cognitive domains are significantly impacted.  The most striking of these changes has to do with significant weakness with regard to working memory/auditory encoding capacity.  She produced  a working memory index score of 77 which falls at the 6 percentile.  I suspect that this auditory encoding capacity likely played an outside impact on other cognitive domains particularly ones that had a greater requirement for attentional capacity.           Verbal Comprehension Subtests Summary     Subtest Raw Score Scaled Score Percentile Rank Reference Group Scaled Score SEM  Similarities '15 6 9 5 '$ 1.04  Vocabulary 33 9 37 9 0.67  Information 13 9 37 10 0.73  (Comprehension) '20 8 25 8 '$ 1.08      The patient produced a verbal comprehension score of 89 which falls at the 23rd percentile and is in the low average range relative to a normative population.  The patient performed in the average range on measures of vocabulary knowledge, general fund of information and social judgment and comprehension capacity.  The patient's lowest score was found for measures of verbal reasoning and problem-solving.  This particular score was well below will be predicted not only from her premorbid functioning but also direct face-to-face interaction during extended clinical interview.  I suspect that this low score was particularly influenced by her significant impairments for auditory encoding capacity rather than a true loss and reasoning and problem-solving.               Perceptual Reasoning Subtests Summary      Subtest Raw Score Scaled Score Percentile Rank Reference Group Scaled Score SEM  Block Design '24 8 25 6 '$ 1.08  Matrix Reasoning '6 5 5 3 '$ 0.90  Visual Puzzles '7 7 16 5 '$ 0.85  (Figure Weights) 10 9 37 7 0.95  (Picture Completion) '9 8 25 7 '$ 1.16      The patient produced a perceptual reasoning index score of 81 which falls at the 10th percentile and is in the low average range relative to a normative population.  There was considerable scatter noted in subtest performance.  Average performance was noted with regard to visual prediction and estimation capacity in the lower end of the average range was  noted for visual analysis and organizational capacity.  The patient had greatest difficulties on measures that required visual reasoning and problem-solving in general.  The 2 of her lower scores are measures that require the most encoding capacity for visual encoding of the measures administered.  Visual analysis and organization was lower than premorbid estimates there did not appear to be significant visual spatial or visual constructional impairments noted on other elements obtained on measures such as items within the [place].               Working Electrical engineer Raw Score Scaled Score Percentile Rank Reference Group Scaled Score SEM  Digit Span '13 3 1 2 '$ 0.79  Arithmetic 12 9 37  9 0.99  (Letter-Number Seq.) '13 6 9 5 '$ 1.04      The patient produced a working memory index score of 77 which follows a 6 percentile and is in the borderline range relative to a normative population.  This is a significantly impaired score and well below predicted levels.  However, there was an interesting contrast to some of the subtest.  The patient showed by far the greatest deficits on primary auditory encoding measures (digit span) where she produced performances in the 1st percentile relative to a normative population.  This is a profound impairment with regard to primary encoding capacity.  This was also seen on the letter number sequencing measures as well.  However, on measures requiring similar auditory encoding where the patient is required to then process information that is in her active auditory Register she performed much better and in the average range (arithmetic subtest).  While her arithmetic subtest scores below what premorbid estimates would predict in her work history would predict it is still within the average range without other elements more primary to this being in the severely and significantly impaired range.          Processing Speed Subtests Summary       Subtest Raw Score Scaled Score Percentile Rank Reference Group Scaled Score SEM  Symbol Search 24 9 37 7 1.31  Coding 51 9 37 6 0.99  (Cancellation) '27 7 16 6 '$ 1.34      The patient produced a processing speed index score of 94 which falls at the 34th percentile and is in the average range relative to normative population.  While this is slightly below predicted levels of premorbid functioning and is in general within the normative average range without indication of significant deficits.  The patient appears to be maintaining elements of information processing speed/focus execute ability especially those requiring visual scanning, visual search and overall speed of mental operations.  These elements require little auditory or visual encoding capacity and are in fact her best scores on this wide range of cognitive measures.       WMS-IV           Index Score Summary       Index Sum of Scaled Scores Index Score Percentile Rank 95% Confidence Interval Qualitative Descriptor  Auditory Memory (AMI) 22 74 4 69-82 Borderline  Visual Memory (VMI) 10 73 4 69-79 Borderline  Immediate Memory (IMI) 21 81 10 76-88 Low Average  Delayed Memory (DMI) 11 61 0.5 56-72 Extremely Low      The patient was then administered the Wechsler Memory Scale-IV for older adults.  On the Wechsler Adult Intelligence Scale the patient showed her most significant impairments with regard to measures of primary auditory encoding and she also showed weaknesses on visual encoding measures on the [place] memory scales.  The degree of encoding deficits particularly for auditory encoding was significant and will very likely have a major impact on all elements of memory and learning.  Breaking memory down between auditory versus visual memory the patient produced an auditory memory index score of 74 which falls at the 4th percentile and is in the borderline range relative to a normative population.  The patient produced a visual  memory index score of 73 which falls at the 4th percentile relative to a normative population as well.  There is no significant difference between auditory and visual memory components.  These performances are well below predicted levels of premorbid functioning and does suggest significant  deficits.  While the patient is describing memory deficits the current performances are well below even those that would be predicted through direct clinical observation and the patient's reports herself.  The patient is reporting memory and recall deficits but this level of impairment identified on these measures is inconsistent with what would be predicted from direct observation and subjective reports.  I suspect that significant impairments with regard to encoding capacity play a significant role in the end result of her memory functioning.  Breaking memory functions down between immediate and delayed recall the patient produced an immediate memory index score of 81 which falls at the 10th percentile and is in the very low average range relative to a normative population.  This is below predicted levels.  However, the patient showed significant loss of information over period of time producing a delayed memory index score of 61 following at the 0.5 percentile.  Again, the level of deficits on the delayed recall is profound and significant and while it is quite worrisome it is not consistent with observed real world levels of functioning.  While I know the patient is reporting memory difficulties and changes this delayed memory index score would be inconsistent with those seen clinically and I suspect stress, encoding difficulties and other factors are playing a role in this level of impairment.  Once it got to the delayed recall portions on much of the measures the patient essentially gave near 0 or 0 responses and quickly deferred.  The patient did not show particularly improvements under cueing for mats either.            Primary Subtest Scaled Score Summary     Subtest Domain Raw Score Scaled Score Percentile Rank  Logical Memory I AM 30 9 37  Logical Memory II AM '9 6 9  '$ Verbal Paired Associates I AM '9 6 9  '$ Verbal Paired Associates II AM 0 1 0.1  Visual Reproduction I VM '24 6 9  '$ Visual Reproduction II VM '4 4 2  '$ Symbol Span VWM '14 8 25             '$ Auditory Memory Process Score Summary      Process Score Raw Score Scaled Score Percentile Rank Cumulative Percentage (Base Rate)  LM II Recognition 14 - - <=2%  VPA II Recognition 13 - - <=2%                Visual Memory Process Score Summary      Process Score Raw Score Scaled Score Percentile Rank Cumulative Percentage (Base Rate)  VR II Recognition 2 - - 3-9%      Summary of Results:   The results of the objective neuropsychological testing do suggest some significant change in cognitive functioning.  The most significant and obvious element of deficits have to do with primary encoding capacity with generally well-preserved information processing speed capacity.  The patient showed significant and profound weaknesses on delayed memory components but these deficits were well outside those described by subjective reports and well outside those predicted through clinical observation during extended clinical interview.  The patient showed weaknesses across the board on roughly all cognitive domains assessed with the exception of information processing speed when compared to premorbid estimates.  Verbal reasoning and problem-solving, visual reasoning and problem-solving, auditory and visual memory and learning were particularly impacted.  The patient appears to have generally well-maintained visual spatial and visual constructional capacity and visual analysis and organizational skills.  The patient does not describe  any particular deficits with regard to geographic orientation which would be consistent with the generally well-preserved visual spatial and visual  constructional abilities.  Impression/Diagnosis:   The results of the current neuropsychological evaluation taking into account the patient's subjective reports, available medical information as well as objective neuropsychological assessment do show some consistencies between 1 another for the most part.  However, some of the elements of objective assessment are well outside and more impaired than would be explained by direct clinical observation as well as subjective reports.  The patient is describing more memory difficulties over the past 8 to 10 months triggering her concern as there is a family history of family members being diagnosed with Alzheimer's dementia.  The patient does not describe a particularly long or steady deterioration in cognitive functioning.  There are no indications of significant cerebrovascular disease and she is showing some age-appropriate small vessel changes but nothing severe.  The primary and most causative deficit noted on cognitive testing was related to significant and severe auditory encoding capacity and to a lesser degree visual encoding capacity.  The patient appears to be maintaining information processing speed as well as maintaining verbal fluency and expressive fluency particularly for lexical fluency.  The patient does describe some targeted naming issues and she did have some mild weakness for semantic fluency noted.  Patient denies any tremors, visual hallucinations or deficits with regard to completing basic ADLs and IADLs.  The level of memory impairments derived during objective neuropsychological assessment would be inconsistent with those real world variables.  As far as diagnostic considerations I strongly suspect that much of the patient's cognitive difficulties are related to functional issues and stressors related to depression, anxiety and psychosocial stressors.  The patient does appear to have objective findings of changes in cognitive functioning  particularly with regard to memory but the observed objective assessment of memory would be far outside of those predicted from direct clinical observation, the patient's subjective reports and descriptions of her functioning etc.  I also suspect that the objective assessment was particularly stressful for the patient further exacerbating attentional components.  The patient does acknowledge significant changes in attention and concentration.  The pattern of cognitive strengths and weaknesses, subjective reports and clinical observations are not particularly consistent with those typically seen with a progressive dementia such as Alzheimer's or Lewy body.  I suspect that depression/anxiety are playing a bigger role and the clinical changes in the patient over the past year also lining up with timelines of significant stress the patient has experienced.  While these may be coincidental in timing and clouding the ability to get a specific assessment I do think that the most likely an appropriate diagnosis at this point would not be of a progressive degenerative condition such as Alzheimer's but 1 of a mild cognitive disorder primarily memory impairment that is being exacerbated by depression/anxiety symptoms.  The depression and anxiety are having a significant impact on attention and concentration which is directly and indirectly impacting all other cognitive domains.  The patient is currently being treated for depression and anxiety with psychotropic interventions.  Bupropion, clonazepam, paroxetine are all part of her psychotropic interventions.  The patient does describe excessive sleeping and this may be playing a role as well.  Patient denies any symptoms consistent with obstructive sleep apnea.  As these psychological variables are complicating direct interpretation it would be appropriate to readminister these measures in approximately 9 months to assess for any progressive changes over time.  But in  any  event, her pattern does not indicate symptoms that I would typically associate with a dementia of the Alzheimer's type or Lewy body dementia etc. but there may be a combination of factors at play but depression and anxiety and significant impairments of attention and concentration are the most likely culprits.  I will sit down with the patient go over the results of the current neuropsychological evaluation and talk about the next steps.  The patient will be followed by neurology with considerations of strategic address of her cognitive difficulties.  Diagnosis:    Mild cognitive impairment with memory loss  Memory loss  Word finding difficulty  Depression with anxiety   _____________________ Ilean Skill, Psy.D. Clinical Neuropsychologist

## 2023-02-11 NOTE — Telephone Encounter (Signed)
Noted  

## 2023-02-11 NOTE — Telephone Encounter (Signed)
Left message for [facility] per Dr. Sima Matas response.

## 2023-02-11 NOTE — Telephone Encounter (Signed)
Follow up to evaluation Dr. Sima Matas will look into it to see if not done as sensitive. If do not have want you need can contact [phone]

## 2023-02-24 DIAGNOSIS — R350 Frequency of micturition: Secondary | ICD-10-CM | POA: Diagnosis not present

## 2023-02-24 DIAGNOSIS — N390 Urinary tract infection, site not specified: Secondary | ICD-10-CM | POA: Diagnosis not present

## 2023-02-25 ENCOUNTER — Ambulatory Visit: Payer: Medicare Other | Admitting: Psychology

## 2023-03-01 ENCOUNTER — Telehealth (INDEPENDENT_AMBULATORY_CARE_PROVIDER_SITE_OTHER): Payer: Medicare Other | Admitting: Medical-Surgical

## 2023-03-01 ENCOUNTER — Encounter: Payer: Self-pay | Admitting: Medical-Surgical

## 2023-03-01 DIAGNOSIS — J029 Acute pharyngitis, unspecified: Secondary | ICD-10-CM

## 2023-03-01 DIAGNOSIS — J101 Influenza due to other identified influenza virus with other respiratory manifestations: Secondary | ICD-10-CM | POA: Diagnosis not present

## 2023-03-01 DIAGNOSIS — R519 Headache, unspecified: Secondary | ICD-10-CM

## 2023-03-01 DIAGNOSIS — R059 Cough, unspecified: Secondary | ICD-10-CM

## 2023-03-01 LAB — POC COVID19 BINAXNOW: SARS Coronavirus 2 Ag: NEGATIVE

## 2023-03-01 LAB — POCT INFLUENZA A/B
Influenza A, POC: NEGATIVE
Influenza B, POC: POSITIVE — AB

## 2023-03-01 MED ORDER — OSELTAMIVIR PHOSPHATE 75 MG PO CAPS: 75.0000 mg | ORAL_CAPSULE | Freq: Two times a day (BID) | ORAL | 0 refills | Status: DC

## 2023-03-01 NOTE — Progress Notes (Signed)
Virtual Visit via Video Note  I connected with Cindy [name] on 03/01/23 at  1:00 PM EDT by a video enabled telemedicine application and verified that I am speaking with the correct person using two identifiers.   I discussed the limitations of evaluation and management by telemedicine and the availability of in person appointments. The patient expressed understanding and agreed to proceed.  Patient location: home Provider locations: office  Subjective:    CC: respiratory symptoms  HPI: Pleasant 72 year old female presenting via MyChart video visit with reports of 24 hours of respiratory symptoms including sinus congestion, sinus pressure, PND, dull headache, cough productive of clear sputum, sore throat, and poor appetite. Was on a trip to [facility] with her cousin who had similar symptoms. Has not tested for COVID although she does have  a test at home.    Past medical history, Surgical history, Family history not pertinant except as noted below, Social history, Allergies, and medications have been entered into the medical record, reviewed, and corrections made.   Review of Systems: See HPI for pertinent positives and negatives.   Objective:    General: Speaking clearly in complete sentences without any shortness of breath.  Alert and oriented x3.  Normal judgment. No apparent acute distress.  Impression and Recommendations:    1. Cough, unspecified type 2. Acute nonintractable headache, unspecified headache type 3. Acute pharyngitis, unspecified etiology POCT flu and COVID swabs completed via drive up swabs.  - POCT Influenza A/B - POC COVID-19  4. Influenza B Flu B positive. Treating with Tamiflu 75mg  BID x 5 days. Recommend conservative measures with OTC cold and flu preparations for symptom management.   I discussed the assessment and treatment plan with the patient. The patient was provided an opportunity to ask questions and all were answered. The patient agreed with the plan  and demonstrated an understanding of the instructions.   The patient was advised to call back or seek an in-person evaluation if the symptoms worsen or if the condition fails to improve as anticipated.  25 minutes of non-face-to-face time was provided during this encounter.  Return if symptoms worsen or fail to improve.  Clearnce Sorrel, DNP, APRN, FNP-BC [facility]

## 2023-03-08 ENCOUNTER — Encounter: Payer: Self-pay | Admitting: Psychology

## 2023-03-08 NOTE — Progress Notes (Signed)
Neuropsychological Evaluation   Patient:  Cindy [name]   DOB: [birth year]  MR Number: [id number]  Location: [facility] [place], [place] [id number] [place] [id number] Dept: [phone]  Start: 10 AM End: 11 AM  Provider/Observer:     Edgardo Roys PsyD  Chief Complaint:      Chief Complaint  Patient presents with   Memory Loss   Anxiety   Depression     Today I provided feedback regarding the results of the recent neuropsychological evaluation.  This visit occurred in my outpatient clinic office with the patient myself present.  Below I will include a copy of the reason for service and summary from the formal neuropsychological evaluation that can be found in the patient's EMR dated 12/01/2022 in its entirety.  Today we reviewed the results of the recent neuropsychological evaluation and went into depth regarding recommendations going forward.   Reason For Service:     Cindy [name] is a 72 year old female referred for neuropsychological evaluation by her treating neurologist Star Age, MD due to increasing memory difficulties and memory loss with the family history of dementia and one of her siblings was diagnosed in the sibling's early 30s.  Patient has a past history of depression that developed after failed marriage in the late 1980s and was also exacerbated after a diagnosis and treatment for breast cancer in 1999.  Patient has a past medical history including reflux disease, hyperlipidemia, breast cancer, arthritis, anxiety, depression, overweight status.  Patient has been describing memory difficulties and other cognitive issues for a little over a year now.  Primary memory issues have to do with difficulty remembering names of people and objects particular people that she has known for years.  Patient has also described with issues related to concentration and  attention.   During the clinical interview today, the patient reports that she really started noticing significant memory issues over the past 8 to 10 months and that she will see someone that she has done very well and cannot recall their name.  She does report that this tends to clear up and improve at times.  The patient is concerned about these changes as she has a brother with a history of diagnosis of Alzheimer's dementia who passed away at age 5.  He was diagnosed initially in his early 65s.  He spent 7 years in a nursing home ultimately dying of pneumonia and Alzheimer symptoms.  The patient is the youngest of 4 children with her oldest sister being 35 years old and living in a retirement community and her second oldest sister is 75 years old with arthritis but no cognitive issues.   The patient denies any visual hallucinations, tremors or other big/major medical issues.  Current medications include Wellbutrin and Paxil.  Patient reports that she loves to sleep and also take some naps during the day.  The patient does not have any symptoms that would suggest obstructive sleep apnea or other pulmonary issues.   The patient has had significant stressors over the past several years with one of them being a great niece who she was very close to passing away from melanoma.  The patient also was close to the niece's young children and the patient has had less contact the niece's 3 young children after her great niece passed away.  The patient reports that her other psychosocial stressors within the family as well as the  patient and her husband having to take care of some aspects with her children.  She describes her husband as very helpful and he is often encouraging her to do things to help with her depression.   Patient had a brain MRI with and without contrast performed on 08/27/2022 and interpreted by Arlice Colt, MD. impressions from this MRI showed that brain volume was normal for age and was  compared to a 2017 CT scan suggesting some mild atrophy.  T2/FLAIR hyperintense foci in the subcortical and deep white matter and subcortical regions, parietal lobes and more in the frontal lobes.  There was no acute findings.      Impression/Diagnosis:   The results of the current neuropsychological evaluation taking into account the patient's subjective reports, available medical information as well as objective neuropsychological assessment do show some consistencies between 1 another for the most part.  However, some of the elements of objective assessment are well outside and more impaired than would be explained by direct clinical observation as well as subjective reports.  The patient is describing more memory difficulties over the past 8 to 10 months triggering her concern as there is a family history of family members being diagnosed with Alzheimer's dementia.  The patient does not describe a particularly long or steady deterioration in cognitive functioning.  There are no indications of significant cerebrovascular disease and she is showing some age-appropriate small vessel changes but nothing severe.  The primary and most causative deficit noted on cognitive testing was related to significant and severe auditory encoding capacity and to a lesser degree visual encoding capacity.  The patient appears to be maintaining information processing speed as well as maintaining verbal fluency and expressive fluency particularly for lexical fluency.  The patient does describe some targeted naming issues and she did have some mild weakness for semantic fluency noted.  Patient denies any tremors, visual hallucinations or deficits with regard to completing basic ADLs and IADLs.  The level of memory impairments derived during objective neuropsychological assessment would be inconsistent with those real world variables.  As far as diagnostic considerations I strongly suspect that much of the patient's cognitive  difficulties are related to functional issues and stressors related to depression, anxiety and psychosocial stressors.  The patient does appear to have objective findings of changes in cognitive functioning particularly with regard to memory but the observed objective assessment of memory would be far outside of those predicted from direct clinical observation, the patient's subjective reports and descriptions of her functioning etc.  I also suspect that the objective assessment was particularly stressful for the patient further exacerbating attentional components.  The patient does acknowledge significant changes in attention and concentration.  The pattern of cognitive strengths and weaknesses, subjective reports and clinical observations are not particularly consistent with those typically seen with a progressive dementia such as Alzheimer's or Lewy body.  I suspect that depression/anxiety are playing a bigger role and the clinical changes in the patient over the past year also lining up with timelines of significant stress the patient has experienced.  While these may be coincidental in timing and clouding the ability to get a specific assessment I do think that the most likely an appropriate diagnosis at this point would not be of a progressive degenerative condition such as Alzheimer's but 1 of a mild cognitive disorder primarily memory impairment that is being exacerbated by depression/anxiety symptoms.  The depression and anxiety are having a significant impact on attention and concentration which is directly  and indirectly impacting all other cognitive domains.  The patient is currently being treated for depression and anxiety with psychotropic interventions.  Bupropion, clonazepam, paroxetine are all part of her psychotropic interventions.  The patient does describe excessive sleeping and this may be playing a role as well.  Patient denies any symptoms consistent with obstructive sleep apnea.  As these  psychological variables are complicating direct interpretation it would be appropriate to readminister these measures in approximately 9 months to assess for any progressive changes over time.  But in any event, her pattern does not indicate symptoms that I would typically associate with a dementia of the Alzheimer's type or Lewy body dementia etc. but there may be a combination of factors at play but depression and anxiety and significant impairments of attention and concentration are the most likely culprits.  I will sit down with the patient go over the results of the current neuropsychological evaluation and talk about the next steps.  The patient will be followed by neurology with considerations of strategic address of her cognitive difficulties.  Diagnosis:    Mild cognitive impairment with memory loss  Memory loss  Word finding difficulty   _____________________ Ilean Skill, Psy.D. Clinical Neuropsychologist

## 2023-03-10 ENCOUNTER — Ambulatory Visit (INDEPENDENT_AMBULATORY_CARE_PROVIDER_SITE_OTHER): Payer: Medicare Other | Admitting: Family Medicine

## 2023-03-10 ENCOUNTER — Encounter: Payer: Self-pay | Admitting: Family Medicine

## 2023-03-10 ENCOUNTER — Telehealth: Payer: Self-pay

## 2023-03-10 VITALS — BP 131/75 | HR 78 | Ht 68.0 in | Wt 169.0 lb

## 2023-03-10 DIAGNOSIS — J101 Influenza due to other identified influenza virus with other respiratory manifestations: Secondary | ICD-10-CM | POA: Insufficient documentation

## 2023-03-10 DIAGNOSIS — G3184 Mild cognitive impairment, so stated: Secondary | ICD-10-CM

## 2023-03-10 NOTE — Assessment & Plan Note (Signed)
She is feeling better at this time.  She will let me know if symptoms worsen.

## 2023-03-10 NOTE — Telephone Encounter (Signed)
Ms. Burwell said she forgot to ask for cough and congestion medication to clear up her influenza symptoms.

## 2023-03-10 NOTE — Assessment & Plan Note (Signed)
Currently on aricept for management of MCI.  Seeing neurology.  May qualify for anti-amyloid MAB.  Will need PET scan to evaluate for amyloid deposits if planning on starting this.  Will defer to neurology on this.

## 2023-03-10 NOTE — Progress Notes (Signed)
Cindy [name] - 72 y.o. female MRN [id number]  Date of birth: [birth year]  Subjective Chief Complaint  Patient presents with   Influenza   Memory Loss    HPI Cindy [name] is a 71 y.o. female here today with complaint of congestion, cough.  Had virtual visit with Caryl Asp recently and tested positive for flu b.  Treated with tamiflu.  She reports that she is feeling much better.   She is seeing neurology for her memory. Started on aricept.  She reports that she is tolerating aricept well at current strength.  Has some questions about additional treatments.  Possibly interested in anti-amyloid MAB therapy. Not currently available at [facility] but she knows a provider who is utilizing this at [facility].  She has not noted any new decline recently.   ROS:  A comprehensive ROS was completed and negative except as noted per HPI  No Known Allergies  Past Medical History:  Diagnosis Date   Arthritis    Cancer ([place])    breast   Depression    GERD (gastroesophageal reflux disease)    Hyperlipidemia    Tendonitis     Past Surgical History:  Procedure Laterality Date   BREAST SURGERY     right   ELBOW SURGERY     right    Social History   Socioeconomic History   Marital status: Married    Spouse name: [name]   Number of children: 0   Years of education: 18   Highest education level: Master's degree (e.g., MA, MS, MEng, MEd, MSW, MBA)  Occupational History   Occupation: Retired.  Tobacco Use   Smoking status: Never   Smokeless tobacco: Never  Vaping Use   Vaping Use: Never used  Substance and Sexual Activity   Alcohol use: Not Currently    Comment: rarely   Drug use: No   Sexual activity: Yes    Birth control/protection: Post-menopausal  Other Topics Concern   Not on file  Social History Narrative   Lives with her husband. They help take care of her brother's grandchildren. She enjoys singing in the choir and gardening.   Social Determinants of Health   Financial Resource Strain:  Low Risk  (12/28/2022)   Overall Financial Resource Strain (CARDIA)    Difficulty of Paying Living Expenses: Not hard at all  Food Insecurity: No Food Insecurity (12/28/2022)   Hunger Vital Sign    Worried About Running Out of Food in the Last Year: Never true    Ran Out of Food in the Last Year: Never true  Transportation Needs: No Transportation Needs (12/28/2022)   PRAPARE - Hydrologist (Medical): No    Lack of Transportation (Non-Medical): No  Physical Activity: Inactive (12/28/2022)   Exercise Vital Sign    Days of Exercise per Week: 0 days    Minutes of Exercise per Session: 0 min  Stress: No Stress Concern Present (12/28/2022)   [place]    Feeling of Stress : Not at all  Social Connections: [place] (12/28/2022)   Social Connection and Isolation Panel [NHANES]    Frequency of Communication with Friends and Family: More than three times a week    Frequency of Social Gatherings with Friends and Family: More than three times a week    Attends Religious Services: More than 4 times per year    Active Member of [employer] or Organizations: Yes    Attends [employer]  or Organization Meetings: More than 4 times per year    Marital Status: Married    Family History  Problem Relation Age of Onset   Cancer Mother        bone   Hypertension Father    Diabetes Father    Stroke Father    Hypertension Sister    Alzheimer's disease Brother    Hypertension Sister    Colon cancer Neg Hx    Esophageal cancer Neg Hx    Rectal cancer Neg Hx    Stomach cancer Neg Hx     Health Maintenance  Topic Date Due   MAMMOGRAM  07/16/2023   Medicare Annual Wellness (AWV)  12/29/2023   DTaP/Tdap/Td (4 - Td or Tdap) 04/05/2028   COLONOSCOPY (Pts 45-61yrs Insurance coverage will need to be confirmed)  08/28/2029   Pneumonia Vaccine 24+ Years old  Completed   INFLUENZA VACCINE  Completed   DEXA SCAN   Completed   COVID-19 Vaccine  Completed   Hepatitis C Screening  Completed   Zoster Vaccines- Shingrix  Completed   HPV VACCINES  Aged Out     ----------------------------------------------------------------------------------------------------------------------------------------------------------------------------------------------------------------- Physical Exam BP 131/75 (BP Location: Left Arm, Patient Position: Sitting, Cuff Size: Normal)   Pulse 78   Ht 5\' 8"  (1.727 m)   Wt 169 lb (76.7 kg)   SpO2 97%   BMI 25.70 kg/m   Physical Exam Constitutional:      Appearance: Normal appearance.  HENT:     Head: Normocephalic and atraumatic.  Eyes:     General: No scleral icterus. Neurological:     Mental Status: She is alert.  Psychiatric:        Mood and Affect: Mood normal.        Behavior: Behavior normal.     ------------------------------------------------------------------------------------------------------------------------------------------------------------------------------------------------------------------- Assessment and Plan  Mild cognitive impairment with memory loss Currently on aricept for management of MCI.  Seeing neurology.  May qualify for anti-amyloid MAB.  Will need PET scan to evaluate for amyloid deposits if planning on starting this.  Will defer to neurology on this.    Influenza B She is feeling better at this time.  She will let me know if symptoms worsen.    No orders of the defined types were placed in this encounter.   No follow-ups on file.    This visit occurred during the SARS-CoV-2 public health emergency.  Safety protocols were in place, including screening questions prior to the visit, additional usage of staff PPE, and extensive cleaning of exam room while observing appropriate contact time as indicated for disinfecting solutions.

## 2023-03-11 ENCOUNTER — Other Ambulatory Visit: Payer: Self-pay | Admitting: Family Medicine

## 2023-03-11 MED ORDER — BENZONATATE 200 MG PO CAPS: 200.0000 mg | ORAL_CAPSULE | Freq: Two times a day (BID) | ORAL | 0 refills | Status: DC | PRN

## 2023-03-11 MED ORDER — DOXYCYCLINE HYCLATE 100 MG PO TABS: 100.0000 mg | ORAL_TABLET | Freq: Two times a day (BID) | ORAL | 0 refills | Status: DC

## 2023-03-17 ENCOUNTER — Ambulatory Visit
Admission: EM | Admit: 2023-03-17 | Discharge: 2023-03-17 | Disposition: A | Discharge status: Home / Self Care | Payer: Medicare Other | Attending: Family Medicine | Admitting: Family Medicine

## 2023-03-17 DIAGNOSIS — R058 Other specified cough: Secondary | ICD-10-CM

## 2023-03-17 DIAGNOSIS — R051 Acute cough: Secondary | ICD-10-CM

## 2023-03-17 LAB — POC SARS CORONAVIRUS 2 AG -  ED: SARS Coronavirus 2 Ag: NEGATIVE

## 2023-03-17 NOTE — ED Provider Notes (Signed)
[facility]    CSN: [id number] Arrival date & time: 03/17/23  1614      History   Chief Complaint Chief Complaint  Patient presents with   Cough    HPI REGNIA SAVASTANO is a 72 y.o. female.   HPI  Patient is concerned with persistent cough.  She is not taking her Tessalon because she is under the impression it is only needed if she has cough that keeps her awake at night.  She is worried that because she still coughing she may have a secondary infection like COVID.  She has company coming to visit, and has plans to be with her church choir and wants to make sure she does not have COVID.  He is still taking her doxycycline.  Producing clear sputum or clear with white specks fever or chills.  No fatigue.  No body aches.  No chest pain or shortness of breath  Past Medical History:  Diagnosis Date   Arthritis    Cancer    breast   Depression    GERD (gastroesophageal reflux disease)    Hyperlipidemia    Tendonitis     Patient Active Problem List   Diagnosis Date Noted   Influenza B 03/10/2023   Mild cognitive impairment with memory loss 12/16/2022   Sebaceous cyst of labia 01/13/2022   MDD (major depressive disorder), recurrent episode 05/23/2020   Blepharitis of both upper and lower eyelid 05/14/2020   Dermatochalasis of both upper eyelids 05/14/2020   Urinary frequency 11/16/2019   Cystitis 10/30/2019   Hypermetropia of both eyes 01/11/2018   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      Home Medications    Prior to Admission medications   Medication Sig Start Date End Date Taking? Authorizing Provider  acyclovir cream (ZOVIRAX) 5 % Apply 1 application topically every 3 (three) hours. Patient taking differently: Apply 1 application  topically as needed. 08/04/20   Luetta Nutting, DO  benzonatate (TESSALON) 200 MG capsule Take 1 capsule (200 mg total) by mouth 2 (two) times daily as needed for cough. 03/11/23   Luetta Nutting, DO  buPROPion (WELLBUTRIN XL) 150 MG 24 hr tablet TAKE 3 TABLETS EVERY DAY 02/23/22   Luetta Nutting, DO  Calcium Carbonate-Vit D-Min (CALCIUM 1200 PO) Take 1 tablet by mouth daily.    [provider]  clonazePAM (KLONOPIN) 1 MG tablet TAKE 1 TABLET TWICE DAILY Patient taking differently: as needed. 01/12/23   Luetta Nutting, DO  dicyclomine (BENTYL) 20 MG tablet TAKE 1 TABLET THREE TIMES DAILY AS NEEDED 05/15/22   Luetta Nutting, DO  donepezil (ARICEPT) 10 MG tablet Take 1 tablet (10 mg total) by mouth at bedtime. 02/10/23   Star Age, MD  doxycycline (VIBRA-TABS) 100 MG tablet Take 1 tablet (100 mg total) by mouth 2 (two) times daily. 03/11/23   Luetta Nutting, DO  lovastatin (MEVACOR) 20 MG tablet TAKE 1 TABLET (20 MG TOTAL) BY MOUTH AT  BEDTIME. 06/01/22   Luetta Nutting, DO  Multiple Vitamin (MULTIVITAMIN) tablet Take 1 tablet by mouth daily.    [provider]  omeprazole (PRILOSEC) 40 MG capsule TAKE 1 CAPSULE EVERY DAY 07/27/22   Luetta Nutting, DO  oseltamivir (TAMIFLU) 75 MG capsule Take 1 capsule (75 mg total) by mouth 2 (two) times daily. 03/01/23   Samuel Bouche, NP  PARoxetine (PAXIL) 20 MG tablet TAKE 2 TABLETS EVERY DAY 08/11/22   Luetta Nutting, DO  polyethylene glycol (MIRALAX / GLYCOLAX) 17 g packet Take 17 g by mouth as needed.    [provider]  trimethoprim (TRIMPEX) 100 MG tablet TAKE 1 TABLET EVERY DAY Patient taking differently: as needed. 05/15/22   Luetta Nutting, DO   valACYclovir (VALTREX) 500 MG tablet TAKE 1 TABLET EVERY DAY Patient taking differently: as needed. 12/01/21   Luetta Nutting, DO    Family History Family History  Problem Relation Age of Onset   Cancer Mother        bone   Hypertension Father    Diabetes Father    Stroke Father    Hypertension Sister    Alzheimer's disease Brother    Hypertension Sister    Colon cancer Neg Hx    Esophageal cancer Neg Hx    Rectal cancer Neg Hx    Stomach cancer Neg Hx     Social History Social History   Tobacco Use   Smoking status: Never   Smokeless tobacco: Never  Vaping Use   Vaping Use: Never used  Substance Use Topics   Alcohol use: Not Currently    Comment: rarely   Drug use: No     Allergies   Patient has no known allergies.   Review of Systems Review of Systems See HPI  Physical Exam Triage Vital Signs ED Triage Vitals  Enc Vitals Group     BP 03/17/23 1621 132/73     Pulse Rate 03/17/23 1621 80     Resp 03/17/23 1621 16     Temp 03/17/23 1621 (!) 97.5 F (36.4 C)     Temp Source 03/17/23 1621 Oral     SpO2 03/17/23 1621 99 %     Weight --      Height --      Head Circumference --      Peak Flow --      Pain Score 03/17/23 1624 0     Pain Loc --      Pain Edu? --      Excl. in [place]? --    No data found.  Updated Vital Signs BP 132/73 (BP Location: Right Arm)   Pulse 80   Temp (!) 97.5 F (36.4 C) (Oral)   Resp 16   SpO2 99%       Physical Exam Constitutional:      General: She is not in acute distress.    Appearance: Normal appearance. She is well-developed. She is not ill-appearing.  HENT:     Head: Normocephalic and atraumatic.     Right Ear: Tympanic membrane and ear canal normal.     Left Ear: Tympanic membrane and ear canal normal.     Nose: Nose normal.     Mouth/Throat:     Mouth: Mucous membranes are moist.     Pharynx: No posterior oropharyngeal erythema.  Eyes:     Conjunctiva/sclera: Conjunctivae normal.     Pupils: Pupils  are equal, round, and reactive to light.  Cardiovascular:  Rate and Rhythm: Normal rate and regular rhythm.     Heart sounds: Normal heart sounds.  Pulmonary:     Effort: Pulmonary effort is normal. No respiratory distress.     Breath sounds: Normal breath sounds. No wheezing or rhonchi.     Comments: Lungs are clear Abdominal:     General: There is no distension.     Palpations: Abdomen is soft.  Musculoskeletal:        General: Normal range of motion.     Cervical back: Normal range of motion.  Skin:    General: Skin is warm and dry.  Neurological:     Mental Status: She is alert.      UC Treatments / Results  Labs (all labs ordered are listed, but only abnormal results are displayed) Labs Reviewed  POC SARS CORONAVIRUS 2 AG -  ED    EKG   Radiology No results found.  Procedures Procedures (including critical care time)  Medications Ordered in UC Medications - No data to display  Initial Impression / Assessment and Plan / UC Course  I have reviewed the triage vital signs and the nursing notes.  Pertinent labs & imaging results that were available during my care of the patient were reviewed by me and considered in my medical decision making (see chart for details).     *** Final Clinical Impressions(s) / UC Diagnoses   Final diagnoses:  None   Discharge Instructions   None    ED Prescriptions   None    PDMP not reviewed this encounter.

## 2023-03-17 NOTE — Discharge Instructions (Addendum)
Continue drinking lots of water Take Tessalon as needed for cough No evidence of persistent infection.  COVID test is negative Cough may persist for weeks after infection.

## 2023-03-17 NOTE — ED Triage Notes (Signed)
Pt c/o cough x 10 days. Denies fever. PCP last Wed. Rx'd doxycycline. Also rx'd tessalon but has not taken.

## 2023-03-26 MED ORDER — FLUCONAZOLE 150 MG PO TABS: 150.0000 mg | ORAL_TABLET | Freq: Once | ORAL | 0 refills | Status: AC

## 2023-03-26 NOTE — Addendum Note (Signed)
Addended by: Mammie Lorenzo on: 03/26/2023 12:37 PM   Modules accepted: Orders

## 2023-04-21 ENCOUNTER — Other Ambulatory Visit: Payer: Self-pay | Admitting: Family Medicine

## 2023-04-21 ENCOUNTER — Ambulatory Visit: Payer: Medicare Other | Admitting: Psychology

## 2023-04-28 ENCOUNTER — Ambulatory Visit: Payer: Medicare Other | Admitting: Psychology

## 2023-05-05 ENCOUNTER — Other Ambulatory Visit: Payer: Self-pay | Admitting: Family Medicine

## 2023-05-06 ENCOUNTER — Other Ambulatory Visit: Payer: Self-pay

## 2023-05-06 ENCOUNTER — Ambulatory Visit
Admission: EM | Admit: 2023-05-06 | Discharge: 2023-05-06 | Disposition: A | Discharge status: Home / Self Care | Payer: Medicare Other | Attending: Family Medicine | Admitting: Family Medicine

## 2023-05-06 DIAGNOSIS — N3000 Acute cystitis without hematuria: Secondary | ICD-10-CM | POA: Diagnosis not present

## 2023-05-06 DIAGNOSIS — L280 Lichen simplex chronicus: Secondary | ICD-10-CM | POA: Diagnosis not present

## 2023-05-06 DIAGNOSIS — R350 Frequency of micturition: Secondary | ICD-10-CM | POA: Diagnosis not present

## 2023-05-06 DIAGNOSIS — B078 Other viral warts: Secondary | ICD-10-CM | POA: Diagnosis not present

## 2023-05-06 DIAGNOSIS — D485 Neoplasm of uncertain behavior of skin: Secondary | ICD-10-CM | POA: Diagnosis not present

## 2023-05-06 LAB — POCT URINALYSIS DIP (MANUAL ENTRY)
Bilirubin, UA: NEGATIVE
Blood, UA: NEGATIVE
Glucose, UA: NEGATIVE mg/dL
Ketones, POC UA: NEGATIVE mg/dL
Nitrite, UA: NEGATIVE
Protein Ur, POC: NEGATIVE mg/dL
Spec Grav, UA: 1.015 (ref 1.010–1.025)
Urobilinogen, UA: 0.2 E.U./dL
pH, UA: 6 (ref 5.0–8.0)

## 2023-05-06 MED ORDER — NITROFURANTOIN MONOHYD MACRO 100 MG PO CAPS: 100.0000 mg | ORAL_CAPSULE | Freq: Two times a day (BID) | ORAL | 0 refills | Status: AC

## 2023-05-06 NOTE — Discharge Instructions (Addendum)
Advised patient to take medication as directed with food to completion.  Encouraged increase daily water intake to 64 ounces per day while taking this medication.  Advised we will follow-up with urine culture results once received.

## 2023-05-06 NOTE — ED Provider Notes (Signed)
[name]    CSN: [id number] Arrival date & time: 05/06/23  1621      History   Chief Complaint Chief Complaint  Patient presents with   Urinary Frequency    HPI Cindy [name] is a 72 y.o. female.   HPI Pleasant 72 year old female female presents with urinary frequency for 1 day and denies dysuria.  PMH significant for breast cancer, HLD, and GERD.  Past Medical History:  Diagnosis Date   Arthritis    Cancer (HCC)    breast   Depression    GERD (gastroesophageal reflux disease)    Hyperlipidemia    Tendonitis     Patient Active Problem List   Diagnosis Date Noted   Influenza B 03/10/2023   Mild cognitive impairment with memory loss 12/16/2022   Sebaceous cyst of labia 01/13/2022   MDD (major depressive disorder), recurrent episode (HCC) 05/23/2020   Blepharitis of both upper and lower eyelid 05/14/2020   Dermatochalasis of both upper eyelids 05/14/2020   Urinary frequency 11/16/2019   Cystitis 10/30/2019   Hypermetropia of both eyes 01/11/2018   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      Home Medications    Prior to Admission medications   Medication Sig Start Date End Date Taking? Authorizing Provider  nitrofurantoin, macrocrystal-monohydrate, (MACROBID) 100 MG capsule Take 1 capsule (100 mg total) by mouth 2 (two) times daily for 7 days. 05/06/23 05/13/23 Yes Trevor Iha, FNP  buPROPion (WELLBUTRIN XL) 150 MG 24 hr tablet TAKE 3  TABLETS EVERY DAY 02/23/22   Everrett Coombe, DO  Calcium Carbonate-Vit D-Min (CALCIUM 1200 PO) Take 1 tablet by mouth daily.    [provider]  clonazePAM (KLONOPIN) 1 MG tablet TAKE 1 TABLET TWICE DAILY Patient taking differently: as needed. 01/12/23   Everrett Coombe, DO  dicyclomine (BENTYL) 20 MG tablet TAKE 1 TABLET THREE TIMES DAILY AS NEEDED 05/15/22   Everrett Coombe, DO  donepezil (ARICEPT) 10 MG tablet Take 1 tablet (10 mg total) by mouth at bedtime. 02/10/23   Huston Foley, MD  lovastatin (MEVACOR) 20 MG tablet TAKE 1 TABLET AT BEDTIME 05/05/23   Everrett Coombe, DO  Multiple Vitamin (MULTIVITAMIN) tablet Take 1 tablet by mouth daily.    [provider]  omeprazole (PRILOSEC) 40 MG capsule TAKE 1 CAPSULE EVERY DAY 04/21/23   Everrett Coombe, DO  PARoxetine (PAXIL) 20 MG tablet TAKE 2 TABLETS EVERY DAY 08/11/22   Everrett Coombe, DO  polyethylene glycol (MIRALAX / GLYCOLAX) 17 g packet Take 17 g by mouth as needed.    [provider]  trimethoprim (TRIMPEX) 100 MG tablet TAKE 1 TABLET EVERY DAY Patient taking differently: as needed. 05/15/22   Everrett Coombe, DO  valACYclovir (VALTREX) 500 MG tablet TAKE 1 TABLET EVERY DAY Patient taking differently: as needed. 12/01/21   Everrett Coombe, DO    Family History Family History  Problem Relation Age of Onset   Cancer Mother  bone   Hypertension Father    Diabetes Father    Stroke Father    Hypertension Sister    Alzheimer's disease Brother    Hypertension Sister    Colon cancer Neg Hx    Esophageal cancer Neg Hx    Rectal cancer Neg Hx    Stomach cancer Neg Hx     Social History Social History   Tobacco Use   Smoking status: Never   Smokeless tobacco: Never  Vaping Use   Vaping Use: Never used  Substance Use Topics   Alcohol use: Not Currently    Comment: rarely   Drug use: No     Allergies   Patient has no known allergies.   Review of Systems Review of Systems  Genitourinary:  Positive  for frequency.  All other systems reviewed and are negative.    Physical Exam Triage Vital Signs ED Triage Vitals  Enc Vitals Group     BP 05/06/23 1631 128/76     Pulse Rate 05/06/23 1631 74     Resp 05/06/23 1631 16     Temp 05/06/23 1633 97.7 F (36.5 C)     Temp Source 05/06/23 1631 Oral     SpO2 05/06/23 1631 98 %     Weight --      Height --      Head Circumference --      Peak Flow --      Pain Score 05/06/23 1632 0     Pain Loc --      Pain Edu? --      Excl. in [place]? --    No data found.  Updated Vital Signs BP 128/76 (BP Location: Right Arm)   Pulse 74   Temp 97.7 F (36.5 C)   Resp 16   SpO2 98%    Physical Exam Vitals and nursing note reviewed.  Constitutional:      Appearance: Normal appearance. She is obese.  HENT:     Head: Normocephalic and atraumatic.     Mouth/Throat:     Mouth: Mucous membranes are moist.     Pharynx: Oropharynx is clear.  Eyes:     Extraocular Movements: Extraocular movements intact.     Conjunctiva/sclera: Conjunctivae normal.     Pupils: Pupils are equal, round, and reactive to light.  Cardiovascular:     Rate and Rhythm: Normal rate and regular rhythm.     Pulses: Normal pulses.     Heart sounds: Normal heart sounds.  Pulmonary:     Effort: Pulmonary effort is normal.     Breath sounds: Normal breath sounds. No wheezing, rhonchi or rales.  Musculoskeletal:        General: Normal range of motion.     Cervical back: Normal range of motion and neck supple.  Skin:    General: Skin is warm and dry.  Neurological:     General: No focal deficit present.     Mental Status: She is alert and oriented to person, place, and time. Mental status is at baseline.  Psychiatric:        Mood and Affect: Mood normal.        Behavior: Behavior normal.      UC Treatments / Results  Labs (all labs ordered are listed, but only abnormal results are displayed) Labs Reviewed  POCT URINALYSIS DIP (MANUAL ENTRY) - Abnormal; Notable  for the following components:      Result Value   Clarity, UA cloudy (*)  Leukocytes, UA Small (1+) (*)    All other components within normal limits  URINE CULTURE    EKG   Radiology No results found.  Procedures Procedures (including critical care time)  Medications Ordered in UC Medications - No data to display  Initial Impression / Assessment and Plan / UC Course  I have reviewed the triage vital signs and the nursing notes.  Pertinent labs & imaging results that were available during my care of the patient were reviewed by me and considered in my medical decision making (see chart for details).     MDM:1.  Acute cystitis without hematuria-Rx'd Macrobid 100 mg capsule twice daily x 7 days, UA reveals above, urine culture ordered. Advised patient to take medication as directed with food to completion.  Encouraged increase daily water intake to 64 ounces per day while taking this medication.  Advised we will follow-up with urine culture results once received.  Discharged home, hemodynamically stable. Final Clinical Impressions(s) / UC Diagnoses   Final diagnoses:  Urinary frequency  Acute cystitis without hematuria     Discharge Instructions      Advised patient to take medication as directed with food to completion.  Encouraged increase daily water intake to 64 ounces per day while taking this medication.  Advised we will follow-up with urine culture results once received.     ED Prescriptions     Medication Sig Dispense Auth. Provider   nitrofurantoin, macrocrystal-monohydrate, (MACROBID) 100 MG capsule Take 1 capsule (100 mg total) by mouth 2 (two) times daily for 7 days. 14 capsule Trevor Iha, FNP      PDMP not reviewed this encounter.   Trevor Iha, FNP 05/06/23 1720

## 2023-05-06 NOTE — ED Triage Notes (Signed)
Pt c/o urinary frequency x 1 day. Denies dysuria.

## 2023-05-08 LAB — URINE CULTURE: Culture: NO GROWTH

## 2023-06-09 ENCOUNTER — Other Ambulatory Visit: Payer: Self-pay | Admitting: *Deleted

## 2023-06-09 MED ORDER — DONEPEZIL HCL 10 MG PO TABS: 10.0000 mg | ORAL_TABLET | Freq: Every day | ORAL | 2 refills | Status: DC

## 2023-06-10 ENCOUNTER — Ambulatory Visit (INDEPENDENT_AMBULATORY_CARE_PROVIDER_SITE_OTHER): Payer: Medicare Other | Admitting: Family Medicine

## 2023-06-10 ENCOUNTER — Encounter: Payer: Self-pay | Admitting: Family Medicine

## 2023-06-10 VITALS — BP 139/58 | HR 72 | Resp 18 | Ht 68.0 in | Wt 170.2 lb

## 2023-06-10 DIAGNOSIS — L859 Epidermal thickening, unspecified: Secondary | ICD-10-CM | POA: Diagnosis not present

## 2023-06-10 DIAGNOSIS — R21 Rash and other nonspecific skin eruption: Secondary | ICD-10-CM | POA: Diagnosis not present

## 2023-06-10 MED ORDER — CLOTRIMAZOLE-BETAMETHASONE 1-0.05 % EX CREA: 1.0000 | TOPICAL_CREAM | Freq: Two times a day (BID) | CUTANEOUS | 0 refills | Status: DC

## 2023-06-10 NOTE — Assessment & Plan Note (Signed)
-   biopsy done and pt tolerated it well - have given trial of clotriamazole cream. Pt already using betamethasone cream

## 2023-06-10 NOTE — Progress Notes (Signed)
Acute Office Visit  Subjective:     Patient ID: Cindy [name], female    DOB: June 20, 1951, 72 y.o.   MRN: [id number]  Chief Complaint  Patient presents with   Rash    Pt states she has fungus on her left lower leg and has been using betamethasone creme    HPI Patient is in today for concerns of rash on her legs.   Review of Systems  Constitutional:  Negative for chills and fever.  Respiratory:  Negative for cough and shortness of breath.   Cardiovascular:  Negative for chest pain.  Neurological:  Negative for headaches.        Objective:    BP (!) 139/58 (BP Location: Left Arm, Patient Position: Sitting, Cuff Size: Normal)   Pulse 72   Resp 18   Ht 5\' 8"  (1.727 m)   Wt 170 lb 4 oz (77.2 kg)   SpO2 98%   BMI 25.89 kg/m    Physical Exam Vitals and nursing note reviewed.  Constitutional:      General: She is not in acute distress.    Appearance: Normal appearance.  HENT:     Head: Normocephalic and atraumatic.     Right Ear: External ear normal.     Left Ear: External ear normal.     Nose: Nose normal.  Eyes:     Conjunctiva/sclera: Conjunctivae normal.  Cardiovascular:     Rate and Rhythm: Normal rate and regular rhythm.  Pulmonary:     Effort: Pulmonary effort is normal.     Breath sounds: Normal breath sounds.  Neurological:     General: No focal deficit present.     Mental Status: She is alert and oriented to person, place, and time.  Psychiatric:        Mood and Affect: Mood normal.        Behavior: Behavior normal.        Thought Content: Thought content normal.        Judgment: Judgment normal.    SHAVE BIOPSY PROCEDURE NOTE  We discussed the indications for biopsy and some of the risks to include: bleeding, infection, scarring, and need for re-biopsy. I answered any questions which the patient / family had.   We then performed a timeout, confirming the patient's name, procedure to be performed, and correct site.  Following the discussion  above, and upon receipt of verbal/written informed consent, I confirmed and marked the area(s), photographed it (them), cleansed the area(s) with an chlorhexadine x3 I then used a gillette blade to obtain a specimen(s), which was (were) sent in formalin to the lab. Hemostasis was obtained using direct pressure, topical aluminum chloride, and/or light electrofulguration when necessary. There were no complications. Wound care discussed.  We will contact the patient by telephone when the results are available. Otherwise, I recommended the patient call the clinic to inquire about the results if they do not hear from [place] after 14 days.   No results found for any visits on 06/10/23.      Assessment & Plan:   Problem List Items Addressed This Visit       Musculoskeletal and Integument   Rash, skin - Primary    - biopsy done and pt tolerated it well - have given trial of clotriamazole cream. Pt already using betamethasone cream        Relevant Medications   clotrimazole-betamethasone (LOTRISONE) cream   Other Relevant Orders   Dermatology pathology    Meds ordered this  encounter  Medications   clotrimazole-betamethasone (LOTRISONE) cream    Sig: Apply 1 Application topically 2 (two) times daily.    Dispense:  45 g    Refill:  0    No follow-ups on file.  Charlton Amor, DO

## 2023-06-15 ENCOUNTER — Ambulatory Visit: Payer: Medicare Other | Admitting: Family Medicine

## 2023-06-15 DIAGNOSIS — L814 Other melanin hyperpigmentation: Secondary | ICD-10-CM | POA: Diagnosis not present

## 2023-06-15 DIAGNOSIS — L280 Lichen simplex chronicus: Secondary | ICD-10-CM | POA: Diagnosis not present

## 2023-06-15 DIAGNOSIS — D1801 Hemangioma of skin and subcutaneous tissue: Secondary | ICD-10-CM | POA: Diagnosis not present

## 2023-06-15 DIAGNOSIS — L821 Other seborrheic keratosis: Secondary | ICD-10-CM | POA: Diagnosis not present

## 2023-06-22 ENCOUNTER — Ambulatory Visit (INDEPENDENT_AMBULATORY_CARE_PROVIDER_SITE_OTHER): Payer: Medicare Other | Admitting: Family Medicine

## 2023-06-22 VITALS — Ht 68.0 in | Wt 170.0 lb

## 2023-06-22 DIAGNOSIS — G3184 Mild cognitive impairment, so stated: Secondary | ICD-10-CM

## 2023-06-22 DIAGNOSIS — R21 Rash and other nonspecific skin eruption: Secondary | ICD-10-CM | POA: Diagnosis not present

## 2023-06-22 DIAGNOSIS — F330 Major depressive disorder, recurrent, mild: Secondary | ICD-10-CM

## 2023-06-22 DIAGNOSIS — E7800 Pure hypercholesterolemia, unspecified: Secondary | ICD-10-CM

## 2023-06-22 NOTE — Assessment & Plan Note (Signed)
She is seeing neurology.  Remains on Aricept.  She may look into monoclonal antibody treatments that are available now as well.

## 2023-06-22 NOTE — Assessment & Plan Note (Signed)
Continue lovastatin at current strength. 

## 2023-06-22 NOTE — Progress Notes (Signed)
Cindy [name] - 72 y.o. female MRN [id number]  Date of birth: October 04, 1951  Subjective Chief Complaint  Patient presents with   Wrist Injury   Rash    HPI Cindy [name] is a 72 year old female here today for follow-up visit.  She reports was doing pretty well.  She was seen recently by Dr. Tamera Punt with complaint of rash on  legs.  She had a shave biopsy which showed hyperkeratosis but no other significant findings.  There was concern about fungal infection but biopsy did not show any fungal elements.  She did see dermatology who prescribed betamethasone cream.  She has had improvements with this and areas gotten smaller.  Blood pressure is well-controlled with losartan.  No side effects of current strength.  She is chest pain, shortness of breath, palpitations, headaches or vision changes.  Mood stable with paroxetine.  Clonazepam as needed but does not use this too often.  She continues to see the neurology for mild cognitive impairment.  She is on donepezil.  Fairly stable at this time.   ROS:  A comprehensive ROS was completed and negative except as noted per HPI  No Known Allergies  Past Medical History:  Diagnosis Date   Arthritis    Cancer (HCC)    breast   Depression    GERD (gastroesophageal reflux disease)    Hyperlipidemia    Tendonitis     Past Surgical History:  Procedure Laterality Date   BREAST SURGERY     right   ELBOW SURGERY     right    Social History   Socioeconomic History   Marital status: Married    Spouse name: Roe Coombs   Number of children: 0   Years of education: 18   Highest education level: Master's degree (e.g., MA, MS, MEng, MEd, MSW, MBA)  Occupational History   Occupation: Retired.  Tobacco Use   Smoking status: Never   Smokeless tobacco: Never  Vaping Use   Vaping Use: Never used  Substance and Sexual Activity   Alcohol use: Not Currently    Comment: rarely   Drug use: No   Sexual activity: Yes    Birth control/protection:  Post-menopausal  Other Topics Concern   Not on file  Social History Narrative   Lives with her husband. They help take care of her brother's grandchildren. She enjoys singing in the choir and gardening.   Social Determinants of Health   Financial Resource Strain: Low Risk  (12/28/2022)   Overall Financial Resource Strain (CARDIA)    Difficulty of Paying Living Expenses: Not hard at all  Food Insecurity: No Food Insecurity (12/28/2022)   Hunger Vital Sign    Worried About Running Out of Food in the Last Year: Never true    Ran Out of Food in the Last Year: Never true  Transportation Needs: No Transportation Needs (12/28/2022)   PRAPARE - Administrator, Civil Service (Medical): No    Lack of Transportation (Non-Medical): No  Physical Activity: Inactive (12/28/2022)   Exercise Vital Sign    Days of Exercise per Week: 0 days    Minutes of Exercise per Session: 0 min  Stress: No Stress Concern Present (12/28/2022)   [employer] of Occupational Health - Occupational Stress Questionnaire    Feeling of Stress : Not at all  Social Connections: Socially Integrated (12/28/2022)   Social Connection and Isolation Panel [NHANES]    Frequency of Communication with Friends and Family: More than three times a week  Frequency of Social Gatherings with Friends and Family: More than three times a week    Attends Religious Services: More than 4 times per year    Active Member of Clubs or Organizations: Yes    Attends Engineer, structural: More than 4 times per year    Marital Status: Married    Family History  Problem Relation Age of Onset   Cancer Mother        bone   Hypertension Father    Diabetes Father    Stroke Father    Hypertension Sister    Alzheimer's disease Brother    Hypertension Sister    Colon cancer Neg Hx    Esophageal cancer Neg Hx    Rectal cancer Neg Hx    Stomach cancer Neg Hx     Health Maintenance  Topic Date Due   INFLUENZA VACCINE   07/15/2023   MAMMOGRAM  07/16/2023   Medicare Annual Wellness (AWV)  12/29/2023   DTaP/Tdap/Td (4 - Td or Tdap) 04/05/2028   Colonoscopy  08/28/2029   Pneumonia Vaccine 77+ Years old  Completed   DEXA SCAN  Completed   COVID-19 Vaccine  Completed   Hepatitis C Screening  Completed   Zoster Vaccines- Shingrix  Completed   HPV VACCINES  Aged Out     ----------------------------------------------------------------------------------------------------------------------------------------------------------------------------------------------------------------- Physical Exam Ht 5\' 8"  (1.727 m)   Wt 170 lb (77.1 kg)   BMI 25.85 kg/m   Physical Exam Constitutional:      Appearance: Normal appearance.  HENT:     Head: Normocephalic and atraumatic.  Eyes:     General: No scleral icterus. Cardiovascular:     Rate and Rhythm: Normal rate and regular rhythm.  Pulmonary:     Effort: Pulmonary effort is normal.     Breath sounds: Normal breath sounds.  Musculoskeletal:     Cervical back: Neck supple.  Skin:    Comments: Slightly raised nodular rash on bilateral shins.  No pain associated with palpation.  Neurological:     Mental Status: She is alert.  Psychiatric:        Mood and Affect: Mood normal.        Behavior: Behavior normal.     ------------------------------------------------------------------------------------------------------------------------------------------------------------------------------------------------------------------- Assessment and Plan  Mild cognitive impairment with memory loss She is seeing neurology.  Remains on Aricept.  She may look into monoclonal antibody treatments that are available now as well.  Hyperlipidemia Continue lovastatin at current strength.  MDD (major depressive disorder), recurrent episode (HCC) She is doing well with Paxil at current strength.  Has clonazepam as needed for associated anxiety which I recommend she use  sparingly.  Rash, skin This is to be improving with topical steroid.  I encouraged her to continue this as needed.     No orders of the defined types were placed in this encounter.   Return in about 6 months (around 12/23/2023) for HLD/Fasting labs..    This visit occurred during the SARS-CoV-2 public health emergency.  Safety protocols were in place, including screening questions prior to the visit, additional usage of staff PPE, and extensive cleaning of exam room while observing appropriate contact time as indicated for disinfecting solutions.

## 2023-06-22 NOTE — Assessment & Plan Note (Signed)
This is to be improving with topical steroid.  I encouraged her to continue this as needed.

## 2023-06-22 NOTE — Assessment & Plan Note (Signed)
She is doing well with Paxil at current strength.  Has clonazepam as needed for associated anxiety which I recommend she use sparingly.

## 2023-07-03 ENCOUNTER — Other Ambulatory Visit: Payer: Self-pay | Admitting: Family Medicine

## 2023-07-12 ENCOUNTER — Other Ambulatory Visit: Payer: Self-pay | Admitting: Family Medicine

## 2023-07-21 DIAGNOSIS — Z1231 Encounter for screening mammogram for malignant neoplasm of breast: Secondary | ICD-10-CM | POA: Diagnosis not present

## 2023-07-21 LAB — HM MAMMOGRAPHY

## 2023-07-23 ENCOUNTER — Encounter: Payer: Self-pay | Admitting: Family Medicine

## 2023-07-30 ENCOUNTER — Other Ambulatory Visit: Payer: Self-pay | Admitting: Family Medicine

## 2023-08-07 ENCOUNTER — Other Ambulatory Visit: Payer: Self-pay | Admitting: Family Medicine

## 2023-08-07 DIAGNOSIS — G3184 Mild cognitive impairment, so stated: Secondary | ICD-10-CM

## 2023-08-07 DIAGNOSIS — F330 Major depressive disorder, recurrent, mild: Secondary | ICD-10-CM

## 2023-08-25 ENCOUNTER — Encounter: Payer: Self-pay | Admitting: Neurology

## 2023-09-02 ENCOUNTER — Encounter: Payer: Medicare Other | Attending: Psychology | Admitting: Psychology

## 2023-09-02 DIAGNOSIS — G3184 Mild cognitive impairment, so stated: Secondary | ICD-10-CM | POA: Insufficient documentation

## 2023-09-02 DIAGNOSIS — R413 Other amnesia: Secondary | ICD-10-CM | POA: Diagnosis not present

## 2023-09-02 DIAGNOSIS — R4789 Other speech disturbances: Secondary | ICD-10-CM | POA: Insufficient documentation

## 2023-09-02 DIAGNOSIS — F418 Other specified anxiety disorders: Secondary | ICD-10-CM | POA: Insufficient documentation

## 2023-09-02 NOTE — Progress Notes (Signed)
Neuropsychological Evaluation   Patient:  Cindy [name]   DOB: August 21, 1951  MR Number: [id number]  Location: [facility] [address], [address] [place] [address] Dept: [phone]  Start: 10 AM End: 11 AM  Today's visit was conducted in my outpatient clinic office with the patient myself present.  1 hour and 15 minutes was spent in face-to-face clinical interview and the other 45 minutes was spent reviewing recent medical records and setting up testing protocols.  Provider/Observer:     Hershal Coria PsyD  Chief Complaint:      Chief Complaint  Patient presents with   Memory Loss   Anxiety   Depression    Reason For Service:     Cindy [name] is a 72 year old female referred for neuropsychological evaluation by her treating neurologist Huston Foley, MD and she has completed the initial or first neuropsychological evaluation at the end of 2023.  This is a follow-up appointment to consider repeat testing if warranted.  I have included portions of the previous neuropsychological evaluation below for convenience and her full neuropsych evaluation can be found in the patient's EMR dated 11/26/2022.  During today's clinical interview the patient reports that she feels like she is doing better than she was the previous year.  The patient reports that she was started on Aricept to see if it would help any.  Patient began developing severe vivid nightmares that triggered her to stop taking the medication.  She coordinated with her treating neurologist and the medication was changed from taking it at night to daytime and the nightmares have stopped.  Patient reports that she did not notice any reticular improvement in her memory or worsening of her memory when she discontinued the Aricept but she is now taking it again without any apparent side effect.  Patient reports that she feels like her  memory is better than it was when I saw her 1 year ago but she does continue to have significant memory difficulties.  The patient reports that her mood is good.  Recently, the patient had a 2-week vacation with some long-term friends that she has that also were nurses.  The patient reports that they did note changes in her memory from what they were familiar with her capacity in the past.  Patient does acknowledge ongoing memory difficulties but does not feel like they are worse.  Patient denies any geographic disorientation and denies any changes in reasoning or problem-solving or visual spatial/visual constructional changes.  The patient reports that she is still doing fine when she is cooking or driving or other independent activities of daily living.  The patient reports that she still has difficulty remembering issues with semantic memory greater than episodic memory.  During today's clinical interview, the patient did repeat several comments and descriptions that she had said almost word for word earlier in the clinical interview.  It was rather unclear whether she was aware that she had already made the statements are not but she did tend to repeat herself and descriptions of what were going on.  The patient had a very positive affect with good mood state and was engaged and alert throughout.  Patient did appear oriented x 4.  I did not do any screening of memory functions during today's visit as we were setting her up for formal repeat testing to be done very soon.  The patient is aware of significant memory changes  but recent mental status exams do not show significant or profound difficulties.  We discussed the benefits and drawbacks of doing repeat testing in the patient agreed to and wanted to repeat testing for direct comparisons to her performances last year.  Again, below you will find the complete clinical history that was derived during the first evaluation back from 11/23 with new information  above from the recent neuropsychological clinical interview.   Cindy [name] is a 72 year old female referred for neuropsychological evaluation by her treating neurologist Huston Foley, MD due to increasing memory difficulties and memory loss with the family history of dementia and one of her siblings was diagnosed in the sibling's early 73s.  Patient has a past history of depression that developed after failed marriage in the late 1980s and was also exacerbated after a diagnosis and treatment for breast cancer in 1999.  Patient has a past medical history including reflux disease, hyperlipidemia, breast cancer, arthritis, anxiety, depression, overweight status.  Patient has been describing memory difficulties and other cognitive issues for a little over a year now.  Primary memory issues have to do with difficulty remembering names of people and objects particular people that she has known for years.  Patient has also described with issues related to concentration and attention.   During the clinical interview today, the patient reports that she really started noticing significant memory issues over the past 8 to 10 months and that she will see someone that she has done very well and cannot recall their name.  She does report that this tends to clear up and improve at times.  The patient is concerned about these changes as she has a brother with a history of diagnosis of Alzheimer's dementia who passed away at age 28.  He was diagnosed initially in his early 82s.  He spent 7 years in a nursing home ultimately dying of pneumonia and Alzheimer symptoms.  The patient is the youngest of 4 children with her oldest sister being 28 years old and living in a retirement community and her second oldest sister is 75 years old with arthritis but no cognitive issues.   The patient denies any visual hallucinations, tremors or other big/major medical issues.  Current medications include Wellbutrin and Paxil.  Patient  reports that she loves to sleep and also take some naps during the day.  The patient does not have any symptoms that would suggest obstructive sleep apnea or other pulmonary issues.   The patient has had significant stressors over the past several years with one of them being a great niece who she was very close to passing away from melanoma.  The patient also was close to the niece's young children and the patient has had less contact the niece's 3 young children after her great niece passed away.  The patient reports that her other psychosocial stressors within the family as well as the patient and her husband having to take care of some aspects with her children.  She describes her husband as very helpful and he is often encouraging her to do things to help with her depression.   Patient had a brain MRI with and without contrast performed on 08/27/2022 and interpreted by Despina Arias, MD. impressions from this MRI showed that brain volume was normal for age and was compared to a 2017 CT scan suggesting some mild atrophy.  T2/FLAIR hyperintense foci in the subcortical and deep white matter and subcortical regions, parietal lobes and more in the frontal lobes.  There was no acute findings.    Impression/Diagnosis:   The results of the current neuropsychological evaluation taking into account the patient's subjective reports, available medical information as well as objective neuropsychological assessment do show some consistencies between 1 another for the most part.  However, some of the elements of objective assessment are well outside and more impaired than would be explained by direct clinical observation as well as subjective reports.  The patient is describing more memory difficulties over the past 8 to 10 months triggering her concern as there is a family history of family members being diagnosed with Alzheimer's dementia.  The patient does not describe a particularly long or steady deterioration in  cognitive functioning.  There are no indications of significant cerebrovascular disease and she is showing some age-appropriate small vessel changes but nothing severe.  The primary and most causative deficit noted on cognitive testing was related to significant and severe auditory encoding capacity and to a lesser degree visual encoding capacity.  The patient appears to be maintaining information processing speed as well as maintaining verbal fluency and expressive fluency particularly for lexical fluency.  The patient does describe some targeted naming issues and she did have some mild weakness for semantic fluency noted.  Patient denies any tremors, visual hallucinations or deficits with regard to completing basic ADLs and IADLs.  The level of memory impairments derived during objective neuropsychological assessment would be inconsistent with those real world variables.  As far as diagnostic considerations I strongly suspect that much of the patient's cognitive difficulties are related to functional issues and stressors related to depression, anxiety and psychosocial stressors.  The patient does appear to have objective findings of changes in cognitive functioning particularly with regard to memory but the observed objective assessment of memory would be far outside of those predicted from direct clinical observation, the patient's subjective reports and descriptions of her functioning etc.  I also suspect that the objective assessment was particularly stressful for the patient further exacerbating attentional components.  The patient does acknowledge significant changes in attention and concentration.  The pattern of cognitive strengths and weaknesses, subjective reports and clinical observations are not particularly consistent with those typically seen with a progressive dementia such as Alzheimer's or Lewy body.  I suspect that depression/anxiety are playing a bigger role and the clinical changes in the  patient over the past year also lining up with timelines of significant stress the patient has experienced.  While these may be coincidental in timing and clouding the ability to get a specific assessment I do think that the most likely an appropriate diagnosis at this point would not be of a progressive degenerative condition such as Alzheimer's but 1 of a mild cognitive disorder primarily memory impairment that is being exacerbated by depression/anxiety symptoms.  The depression and anxiety are having a significant impact on attention and concentration which is directly and indirectly impacting all other cognitive domains.  The patient is currently being treated for depression and anxiety with psychotropic interventions.  Bupropion, clonazepam, paroxetine are all part of her psychotropic interventions.  The patient does describe excessive sleeping and this may be playing a role as well.  Patient denies any symptoms consistent with obstructive sleep apnea.  As these psychological variables are complicating direct interpretation it would be appropriate to readminister these measures in approximately 9 months to assess for any progressive changes over time.  But in any event, her pattern does not indicate symptoms that I would typically associate with a  dementia of the Alzheimer's type or Lewy body dementia etc. but there may be a combination of factors at play but depression and anxiety and significant impairments of attention and concentration are the most likely culprits.  The patient has been started on Aricept.  Today, she reports that she did have an increase in very vivid nightmares that triggered her to stop taking the medication.  Patient consulted with her treating neurologist Dr. Frances Furbish who suggested that she begin taking it in the morning rather than the evening and the nightmares have stopped.  Patient reported today that she did not notice any worsening of her memory when she discontinued the  medication.  She is having no other side effects.  Patient has not been scheduled for repeat neuropsychological testing and we will repeat the previous neuropsychological evaluation that included the control or word association test in the Wechsler Adult Intelligence Scale's and Wechsler Memory Scale's.  Once this is then completed a full neuropsych report will be made comparing previous evaluation conducted in the end of 2023 to gain a greater handle on diagnostic considerations.  Diagnosis:    Mild cognitive impairment with memory loss  Memory loss  Word finding difficulty  Depression with anxiety   _____________________ Arley Phenix, Psy.D. Clinical Neuropsychologist

## 2023-09-06 DIAGNOSIS — Z23 Encounter for immunization: Secondary | ICD-10-CM | POA: Diagnosis not present

## 2023-09-14 ENCOUNTER — Ambulatory Visit (INDEPENDENT_AMBULATORY_CARE_PROVIDER_SITE_OTHER): Payer: Medicare Other | Admitting: Medical-Surgical

## 2023-09-14 ENCOUNTER — Encounter: Payer: Self-pay | Admitting: Medical-Surgical

## 2023-09-14 VITALS — BP 120/74 | HR 83 | Resp 20 | Ht 68.0 in | Wt 170.0 lb

## 2023-09-14 DIAGNOSIS — G43019 Migraine without aura, intractable, without status migrainosus: Secondary | ICD-10-CM

## 2023-09-14 MED ORDER — PROMETHAZINE HCL 25 MG/ML IJ SOLN
25.0000 mg | Freq: Once | INTRAMUSCULAR | Status: AC
  Administered 2023-09-14: 25 mg via INTRAMUSCULAR

## 2023-09-14 MED ORDER — DEXAMETHASONE SODIUM PHOSPHATE 10 MG/ML IJ SOLN
10.0000 mg | Freq: Once | INTRAMUSCULAR | Status: AC
  Administered 2023-09-14: 10 mg via INTRAMUSCULAR

## 2023-09-14 MED ORDER — KETOROLAC TROMETHAMINE 60 MG/2ML IM SOLN
60.0000 mg | Freq: Once | INTRAMUSCULAR | Status: AC
  Administered 2023-09-14: 60 mg via INTRAMUSCULAR

## 2023-09-14 NOTE — Progress Notes (Signed)
        Established patient visit  History, exam, impression, and plan:  1. Intractable migraine without aura and without status migrainosus Pleasant 72 year old female accompanied by her husband presenting today for evaluation of a severe headache along the forehead and bilateral temples.  She also has noted that the blood vessels on her bilateral temples are more prominent than usual and her face hurts all over.  Her symptoms started on Sunday and she has been trying to manage with rest and as needed Tylenol.  She has not noticed much difference with the treatment so far.  No prior history of migraines.  Positive for associated photophobia and phonophobia.  Some intermittent mild nausea but no vomiting.  No red flag symptoms.  Imaging done last year with an MRI of the brain showed no abnormalities of concern.  No neurological or cognitive deficits today.  No vision changes.  Discussed possible imaging however after 3 days with no neurological or cognitive deficits, low suspicion for aneurysm.  Plan to treat as a migraine with a migraine cocktail of Decadron 10 mg, Phenergan 25 mg, and Toradol 60 mg x 1.  Advised patient to go home and get plenty of sleep.  Stay well-hydrated and work on eating small frequent meals as tolerated.  If symptoms persist despite migraine cocktail, we may need to consider further workup.  Procedures performed this visit: None.  Return if symptoms worsen or fail to improve.  __________________________________ Thayer Ohm, DNP, APRN, FNP-BC Primary Care and Sports Medicine [facility] [place]

## 2023-09-14 NOTE — Addendum Note (Signed)
Addended by: Latanya Presser on: 09/14/2023 11:39 AM   Modules accepted: Orders

## 2023-09-16 DIAGNOSIS — Z23 Encounter for immunization: Secondary | ICD-10-CM | POA: Diagnosis not present

## 2023-09-17 ENCOUNTER — Ambulatory Visit (HOSPITAL_COMMUNITY)
Admission: RE | Admit: 2023-09-17 | Discharge: 2023-09-17 | Disposition: A | Discharge status: Home / Self Care | Payer: Medicare Other | Source: Ambulatory Visit | Attending: Medical-Surgical | Admitting: Medical-Surgical

## 2023-09-17 ENCOUNTER — Telehealth: Payer: Self-pay | Admitting: Family Medicine

## 2023-09-17 DIAGNOSIS — R9082 White matter disease, unspecified: Secondary | ICD-10-CM | POA: Diagnosis not present

## 2023-09-17 DIAGNOSIS — R519 Headache, unspecified: Secondary | ICD-10-CM | POA: Diagnosis not present

## 2023-09-17 NOTE — Telephone Encounter (Signed)
Patient called in stating that the medication given to her migraines helped but today she woke up with he same pain that she had prior to her appointment with Joy. She wants to know what is reccommended next. Please advise

## 2023-09-17 NOTE — Telephone Encounter (Signed)
Ordered MRI STAT to be completed at [facility] for new onset persistent severe headache.  This will need to be authorized by insurance and once it has been approved, they will call to get her scheduled.  Would like to get this done today.  Patient can pick up the Nurtec sample at the front desk to give this a try if she would like to see if it will help with improvement of the headache. ___________________________________________ Thayer Ohm, DNP, APRN, FNP-BC Primary Care and Sports Medicine [facility] [place]

## 2023-09-21 ENCOUNTER — Other Ambulatory Visit: Payer: Self-pay | Admitting: Medical-Surgical

## 2023-09-21 ENCOUNTER — Encounter: Payer: Self-pay | Admitting: Medical-Surgical

## 2023-09-21 MED ORDER — NURTEC 75 MG PO TBDP: 1.0000 | ORAL_TABLET | Freq: Every day | ORAL | 0 refills | Status: DC | PRN

## 2023-09-22 ENCOUNTER — Encounter: Payer: Medicare Other | Attending: Psychology

## 2023-09-22 DIAGNOSIS — R413 Other amnesia: Secondary | ICD-10-CM | POA: Diagnosis not present

## 2023-09-22 DIAGNOSIS — G3184 Mild cognitive impairment, so stated: Secondary | ICD-10-CM | POA: Insufficient documentation

## 2023-09-22 NOTE — Addendum Note (Signed)
Addended by: Chalmers Cater on: 09/22/2023 02:18 PM   Modules accepted: Orders

## 2023-09-23 MED ORDER — NURTEC 75 MG PO TBDP: 1.0000 | ORAL_TABLET | Freq: Every day | ORAL | 1 refills | Status: DC | PRN

## 2023-09-23 NOTE — Addendum Note (Signed)
Addended by: Chalmers Cater on: 09/23/2023 08:26 AM   Modules accepted: Orders

## 2023-09-23 NOTE — Progress Notes (Signed)
Behavioral Observations:  The patient appeared well-groomed and appropriately dressed. Her manners were polite and appropriate to the situation. The patient's attitude towards testing was positive and her effort was good.   Neuropsychology Note  Cindy [name] completed 120 minutes of neuropsychological testing with technician, Staci Acosta, BA, under the supervision of Arley Phenix, PsyD., Clinical Neuropsychologist. The patient did not appear overtly distressed by the testing session, per behavioral observation or via self-report to the technician. Rest breaks were offered.   Clinical Decision Making: In considering the patient's current level of functioning, level of presumed impairment, nature of symptoms, emotional and behavioral responses during clinical interview, level of literacy, and observed level of motivation/effort, a battery of tests was selected by Dr. Kieth Brightly during initial consultation on 09/02/2023. This was communicated to the technician. Communication between the neuropsychologist and technician was ongoing throughout the testing session and changes were made as deemed necessary based on patient performance on testing, technician observations and additional pertinent factors such as those listed above.  Tests Administered: Controlled Oral Word Association Test (COWAT; FAS & Animals)  Wechsler Adult Intelligence Scale, 4th Edition (WAIS-IV) Wechsler Memory Scale, 4th Edition (WMS-IV); Older Adult Battery    Results:  COWAT:  FAS total= 30 Z= -0.38 Animals total= 19 Z= 0.73  WAIS-IV:  Composite Score Summary  Scale Sum of Scaled Scores Composite Score Percentile Rank 95% Conf. Interval Qualitative Description  Verbal Comprehension 24 VCI 89 23 84-95 Low Average  Perceptual Reasoning 24 PRI 88 21 82-95 Low Average  Working Memory 18 WMI 95 37 89-102 Average  Processing Speed 19 PSI 97 42 89-106 Average  Full Scale 85 FSIQ 89 23 85-93 Low Average   General Ability 48 GAI 87 19 82-92 Low Average    Verbal Comprehension Subtests Summary  Subtest Raw Score Scaled Score Percentile Rank Reference Group Scaled Score SEM  Similarities 14 6 9 5  0.95  Vocabulary 29 8 25 8  0.67  Information 15 10 50 11 0.73  The scaled scores in the Reference Group Scaled Score column are based on the performance of examinees aged 20:0-34:11 (i.e., the reference group). See Chapter 6 of the WAIS-IV Technical and Interpretive Manual for more information.  Perceptual Reasoning Subtests Summary  Subtest Raw Score Scaled Score Percentile Rank Reference Group Scaled Score SEM  Block Design 24 8 25 6  0.99  Matrix Reasoning 9 8 25 4  0.90  Visual Puzzles 8 8 25 6  0.99   Working English as a second language teacher  Subtest Raw Score Scaled Score Percentile Rank Reference Group Scaled Score SEM  Digit Span 23 9 37 7 0.73  Arithmetic 12 9 37 9 1.20   Processing Speed Subtests Summary  Subtest Raw Score Scaled Score Percentile Rank Reference Group Scaled Score SEM  Symbol Search 22 9 37 6 1.12  Coding 51 10 50 6 1.12     WMS-IV:   Index Score Summary  Index Sum of Scaled Scores Index Score Percentile Rank 95% Confidence Interval Qualitative Descriptor  Auditory Memory (AMI) 14 60 0.4 56-68 Extremely Low  Visual Memory (VMI) 8 66 1 62-72 Extremely Low  Immediate Memory (IMI) 15 69 2 64-77 Extremely Low  Delayed Memory (DMI) 7 51 0.1 47-63 Extremely Low    Primary Subtest Scaled Score Summary  Subtest Domain Raw Score Scaled Score Percentile Rank  Logical Memory I AM 20 6 9   Logical Memory II AM 6 5 5   Verbal Paired Associates I AM 2 2 0.4  Verbal Paired Associates  II AM 0 1 0.1  Visual Reproduction I VM 23 7 16   Visual Reproduction II VM 0 1 0.1  Symbol Span VWM 2 2 0.4    Auditory Memory Process Score Summary  Process Score Raw Score Scaled Score Percentile Rank Cumulative Percentage (Base Rate)  LM II Recognition 13 - - 3-9%  VPA II Recognition 14 - -  <=2%   Visual Memory Process Score Summary  Process Score Raw Score Scaled Score Percentile Rank Cumulative Percentage (Base Rate)  VR II Recognition 5 - - 51-75%   ABILITY-MEMORY ANALYSIS  Ability Score:  VCI: 89 Date of Testing:  WAIS-IV; WMS-IV 2023/09/22  Predicted Difference Method   Index Predicted WMS-IV Index Score Actual WMS-IV Index Score Difference Critical Value  Significant Difference Y/N Base Rate  Auditory Memory 94 60 34 10.41 Y <1%  Visual Memory 95 66 29 7.35 Y 2%  Immediate Memory 94 69 25 9.69 Y 2-3%  Delayed Memory 94 51 43 12.22 Y <1%  Statistical significance (critical value) at the .01 level.    Feedback to Patient: Cindy [name] will return on 06/22/2024 for an interactive feedback session with Dr. Kieth Brightly at which time her test performances, clinical impressions and treatment recommendations will be reviewed in detail. The patient understands she can contact our office should she require our assistance before this time.  120 minutes spent face-to-face with patient administering standardized tests, 30 minutes spent scoring Radiographer, therapeutic). [CPT [id number], [id number]]  Full report to follow.

## 2023-09-27 ENCOUNTER — Encounter: Payer: Self-pay | Admitting: Family Medicine

## 2023-09-27 ENCOUNTER — Ambulatory Visit (INDEPENDENT_AMBULATORY_CARE_PROVIDER_SITE_OTHER): Payer: Medicare Other | Admitting: Family Medicine

## 2023-09-27 VITALS — BP 123/74 | HR 84 | Ht 68.0 in | Wt 166.8 lb

## 2023-09-27 DIAGNOSIS — R519 Headache, unspecified: Secondary | ICD-10-CM | POA: Diagnosis not present

## 2023-09-27 MED ORDER — DEXAMETHASONE SODIUM PHOSPHATE 10 MG/ML IJ SOLN
10.0000 mg | Freq: Once | INTRAMUSCULAR | Status: AC
Start: 2023-09-27 — End: 2023-09-27
  Administered 2023-09-27: 10 mg via INTRAMUSCULAR

## 2023-09-27 MED ORDER — SUMATRIPTAN SUCCINATE 25 MG PO TABS: 25.0000 mg | ORAL_TABLET | ORAL | 0 refills | Status: DC | PRN

## 2023-09-27 MED ORDER — PROMETHAZINE HCL 25 MG/ML IJ SOLN: 25.0000 mg | Freq: Once | INTRAMUSCULAR | Status: DC

## 2023-09-27 MED ORDER — PROMETHAZINE HCL 25 MG/ML IJ SOLN
25.0000 mg | Freq: Once | INTRAMUSCULAR | Status: AC
  Administered 2023-09-27: 25 mg via INTRAMUSCULAR

## 2023-09-27 MED ORDER — KETOROLAC TROMETHAMINE 30 MG/ML IJ SOLN
30.0000 mg | Freq: Once | INTRAMUSCULAR | Status: AC
Start: 2023-09-27 — End: 2023-09-27
  Administered 2023-09-27: 30 mg via INTRAMUSCULAR

## 2023-09-27 NOTE — Assessment & Plan Note (Signed)
Given migraine cocktail of Toradol 30 mg, promethazine 25 mg and Decadron 10 mg once again today as this seemed to help previously.  Provided a couple of additional Nurtec samples.  Will send in Imitrex for her to try as well.  Recent MRI is reassuring.  I want to check ESR and CRP and she does have some temporal pain.

## 2023-09-27 NOTE — Progress Notes (Signed)
Cindy [name] - 72 y.o. female MRN [id number]  Date of birth: 26-Feb-1951  Subjective Chief Complaint  Patient presents with   Migraine    HPI Cindy [name] is a 72 y.o. female here today with complaint of continued headache.  She was seen a couple weeks ago in an clinic by [facility] for the same issue.  Headaches are located in the left occipital parietal area as well as the bilateral temporal area.  She did have migraines several years ago and these went away after menopause.  She does have some light and sound sensitivity associated with this.  Migraine cocktail that she received previously did help for a few days however symptoms returned.  Nurtec has been somewhat helpful however insurance will not continue coverage on this until she tries a triptan.  No new medication changes.  MRI shows chronic microvascular changes but is otherwise unremarkable.  She has not noticed sinus pain.  Denies head trauma.  ROS:  A comprehensive ROS was completed and negative except as noted per HPI  No Known Allergies  ROS:  A comprehensive ROS was completed and negative except as noted per HPI   Past Medical History:  Diagnosis Date   Arthritis    Cancer (HCC)    breast   Depression    GERD (gastroesophageal reflux disease)    Hyperlipidemia    Tendonitis     Past Surgical History:  Procedure Laterality Date   BREAST SURGERY     right   ELBOW SURGERY     right    Social History   Socioeconomic History   Marital status: Married    Spouse name: Roe Coombs   Number of children: 0   Years of education: 18   Highest education level: Master's degree (e.g., MA, MS, MEng, MEd, MSW, MBA)  Occupational History   Occupation: Retired.  Tobacco Use   Smoking status: Never   Smokeless tobacco: Never  Vaping Use   Vaping status: Never Used  Substance and Sexual Activity   Alcohol use: Not Currently    Comment: rarely   Drug use: No   Sexual activity: Yes    Birth control/protection: Post-menopausal   Other Topics Concern   Not on file  Social History Narrative   Lives with her husband. They help take care of her brother's grandchildren. She enjoys singing in the choir and gardening.   Social Determinants of Health   Financial Resource Strain: Low Risk  (12/28/2022)   Overall Financial Resource Strain (CARDIA)    Difficulty of Paying Living Expenses: Not hard at all  Food Insecurity: No Food Insecurity (12/28/2022)   Hunger Vital Sign    Worried About Running Out of Food in the Last Year: Never true    Ran Out of Food in the Last Year: Never true  Transportation Needs: No Transportation Needs (12/28/2022)   PRAPARE - Administrator, Civil Service (Medical): No    Lack of Transportation (Non-Medical): No  Physical Activity: Inactive (12/28/2022)   Exercise Vital Sign    Days of Exercise per Week: 0 days    Minutes of Exercise per Session: 0 min  Stress: No Stress Concern Present (12/28/2022)   [employer] of Occupational Health - Occupational Stress Questionnaire    Feeling of Stress : Not at all  Social Connections: Socially Integrated (12/28/2022)   Social Connection and Isolation Panel [NHANES]    Frequency of Communication with Friends and Family: More than three times a week  Frequency of Social Gatherings with Friends and Family: More than three times a week    Attends Religious Services: More than 4 times per year    Active Member of Clubs or Organizations: Yes    Attends Engineer, structural: More than 4 times per year    Marital Status: Married    Family History  Problem Relation Age of Onset   Cancer Mother        bone   Hypertension Father    Diabetes Father    Stroke Father    Hypertension Sister    Alzheimer's disease Brother    Hypertension Sister    Colon cancer Neg Hx    Esophageal cancer Neg Hx    Rectal cancer Neg Hx    Stomach cancer Neg Hx     Health Maintenance  Topic Date Due   COVID-19 Vaccine (7 - 2023-24 season)  08/15/2023   Medicare Annual Wellness (AWV)  12/29/2023   MAMMOGRAM  07/20/2024   DTaP/Tdap/Td (4 - Td or Tdap) 04/05/2028   Colonoscopy  08/28/2029   Pneumonia Vaccine 19+ Years old  Completed   INFLUENZA VACCINE  Completed   DEXA SCAN  Completed   Hepatitis C Screening  Completed   Zoster Vaccines- Shingrix  Completed   HPV VACCINES  Aged Out     ----------------------------------------------------------------------------------------------------------------------------------------------------------------------------------------------------------------- Physical Exam BP 123/74 (BP Location: Right Arm, Patient Position: Lying right side, Cuff Size: Normal)   Pulse 84   Ht 5\' 8"  (1.727 m)   Wt 166 lb 12 oz (75.6 kg)   SpO2 98%   BMI 25.35 kg/m   Physical Exam Constitutional:      Appearance: Normal appearance.  Eyes:     General: No scleral icterus. Cardiovascular:     Rate and Rhythm: Normal rate and regular rhythm.  Pulmonary:     Effort: Pulmonary effort is normal.     Breath sounds: Normal breath sounds.  Musculoskeletal:     Cervical back: Neck supple.  Neurological:     Mental Status: She is alert.  Psychiatric:        Mood and Affect: Mood normal.        Behavior: Behavior normal.     ------------------------------------------------------------------------------------------------------------------------------------------------------------------------------------------------------------------- Assessment and Plan  Intractable headache Given migraine cocktail of Toradol 30 mg, promethazine 25 mg and Decadron 10 mg once again today as this seemed to help previously.  Provided a couple of additional Nurtec samples.  Will send in Imitrex for her to try as well.  Recent MRI is reassuring.  I want to check ESR and CRP and she does have some temporal pain.   Meds ordered this encounter  Medications   SUMAtriptan (IMITREX) 25 MG tablet    Sig: Take 1 tablet (25 mg  total) by mouth every 2 (two) hours as needed for migraine. May repeat in 2 hours if headache persists or recurs.    Dispense:  10 tablet    Refill:  0   ketorolac (TORADOL) 30 MG/ML injection 30 mg   DISCONTD: promethazine (PHENERGAN) injection 25 mg   dexamethasone (DECADRON) injection 10 mg   promethazine (PHENERGAN) injection 25 mg    No follow-ups on file.    This visit occurred during the SARS-CoV-2 public health emergency.  Safety protocols were in place, including screening questions prior to the visit, additional usage of staff PPE, and extensive cleaning of exam room while observing appropriate contact time as indicated for disinfecting solutions.

## 2023-09-28 LAB — SEDIMENTATION RATE: Sed Rate: 16 mm/h (ref 0–40)

## 2023-09-28 LAB — C-REACTIVE PROTEIN: CRP: 51 mg/L — ABNORMAL HIGH (ref 0–10)

## 2023-10-01 ENCOUNTER — Other Ambulatory Visit: Payer: Self-pay

## 2023-10-01 ENCOUNTER — Telehealth: Payer: Self-pay

## 2023-10-01 MED ORDER — PROMETHAZINE HCL 25 MG PO TABS: 25.0000 mg | ORAL_TABLET | Freq: Four times a day (QID) | ORAL | 0 refills | Status: DC | PRN

## 2023-10-01 MED ORDER — SUMATRIPTAN SUCCINATE 25 MG PO TABS: 25.0000 mg | ORAL_TABLET | ORAL | 0 refills | Status: DC | PRN

## 2023-10-01 NOTE — Telephone Encounter (Signed)
Patient advised.

## 2023-10-01 NOTE — Telephone Encounter (Signed)
Sent phenergan for nausea associated with migraine as needed.

## 2023-10-01 NOTE — Telephone Encounter (Signed)
Cindy [name] states she is not feeling better. She did feel better after the injections on Monday. The migraine came back on Tuesday. [name] didn't pick up the Imitrex. I advised her to pick up the Imitrex and take as directed. She states she will. However she gets nauseated when taking the Imitrex. She would like something for nausea sent to [facility] in [place].

## 2023-10-05 ENCOUNTER — Other Ambulatory Visit: Payer: Self-pay

## 2023-10-05 MED ORDER — SUMATRIPTAN SUCCINATE 25 MG PO TABS: 25.0000 mg | ORAL_TABLET | ORAL | 2 refills | Status: DC | PRN

## 2023-10-06 ENCOUNTER — Ambulatory Visit: Payer: BLUE CROSS/BLUE SHIELD | Admitting: Family Medicine

## 2023-10-06 ENCOUNTER — Ambulatory Visit (INDEPENDENT_AMBULATORY_CARE_PROVIDER_SITE_OTHER): Payer: Medicare Other | Admitting: Family Medicine

## 2023-10-06 ENCOUNTER — Encounter: Payer: Self-pay | Admitting: Family Medicine

## 2023-10-06 VITALS — BP 116/58 | HR 89 | Ht 68.0 in | Wt 165.0 lb

## 2023-10-06 DIAGNOSIS — G43019 Migraine without aura, intractable, without status migrainosus: Secondary | ICD-10-CM | POA: Insufficient documentation

## 2023-10-06 MED ORDER — PREDNISONE 10 MG PO TABS
ORAL_TABLET | ORAL | 0 refills | Status: DC
Start: 2023-10-06 — End: 2023-11-10

## 2023-10-06 MED ORDER — TOPIRAMATE 50 MG PO TABS
ORAL_TABLET | ORAL | 1 refills | Status: DC
Start: 2023-10-06 — End: 2023-10-29

## 2023-10-06 NOTE — Progress Notes (Signed)
   Acute Office Visit  Subjective:     Patient ID: Cindy [name], female    DOB: 07-03-1951, 72 y.o.   MRN: [id number]  Chief Complaint  Patient presents with   Migraine    HPI Patient is in today for Migraines. Started while she was in [place].  Saw her PCP about 2 weeks ago. Using extra strength Tylenol once a day.  Occ vomits with her headaches. She was given a migraine cocktail of Toradol,promethazine and decadron.  Sed rate was normal. Given Nurtec samples. Helps temporarily.  Took topamax years ago and did well.   Never picked up the Imitrex.    HA today is fontal and 4/10.   ROS      Objective:    BP (!) 116/58   Pulse 89   Ht 5\' 8"  (1.727 m)   Wt 165 lb (74.8 kg)   SpO2 100%   BMI 25.09 kg/m    Physical Exam Constitutional:      Appearance: Normal appearance.  HENT:     Head: Normocephalic and atraumatic.     Right Ear: Tympanic membrane, ear canal and external ear normal. There is no impacted cerumen.     Left Ear: Tympanic membrane, ear canal and external ear normal. There is no impacted cerumen.     Nose: Nose normal.     Mouth/Throat:     Pharynx: Oropharynx is clear.  Eyes:     Conjunctiva/sclera: Conjunctivae normal.  Cardiovascular:     Rate and Rhythm: Normal rate and regular rhythm.  Pulmonary:     Effort: Pulmonary effort is normal.     Breath sounds: Normal breath sounds.  Musculoskeletal:     Cervical back: Neck supple. No tenderness.  Lymphadenopathy:     Cervical: No cervical adenopathy.  Skin:    General: Skin is warm and dry.  Neurological:     Mental Status: She is alert and oriented to person, place, and time.  Psychiatric:        Mood and Affect: Mood normal.     No results found for any visits on 10/06/23.      Assessment & Plan:   Problem List Items Addressed This Visit       Cardiovascular and Mediastinum   Intractable migraine without aura and without status migrainosus - Primary    Given prednisone tape. Take  iwht food and water. Avoid All other meds during those 5 days. Call if HA not better in 5 days.  Start prophylaxis with topamax. See if can find triggers or pattern to the HAs.          Relevant Medications   predniSONE (DELTASONE) 10 MG tablet   topiramate (TOPAMAX) 50 MG tablet    Meds ordered this encounter  Medications   predniSONE (DELTASONE) 10 MG tablet    Sig: 8 tabs PO Day 1, 6 tabs Day 2, 4 tabs Day 3, 2 tabs Day 4, 1 tab Day 5.    Dispense:  21 tablet    Refill:  0   topiramate (TOPAMAX) 50 MG tablet    Sig: Take 0.5 tablets (25 mg total) by mouth at bedtime for 7 days, THEN 0.5 tablets (25 mg total) 2 (two) times daily for 7 days, THEN 1 tablet (50 mg total) 2 (two) times daily for 16 days.    Dispense:  43 tablet    Refill:  1    No follow-ups on file.  Nani Gasser, MD

## 2023-10-06 NOTE — Assessment & Plan Note (Signed)
Given prednisone tape. Take iwht food and water. Avoid All other meds during those 5 days. Call if HA not better in 5 days.  Start prophylaxis with topamax. See if can find triggers or pattern to the HAs.

## 2023-10-11 ENCOUNTER — Ambulatory Visit
Admission: EM | Admit: 2023-10-11 | Discharge: 2023-10-11 | Disposition: A | Discharge status: Home / Self Care | Payer: Medicare Other | Attending: Family Medicine | Admitting: Family Medicine

## 2023-10-11 ENCOUNTER — Encounter: Payer: Self-pay | Admitting: *Deleted

## 2023-10-11 ENCOUNTER — Other Ambulatory Visit: Payer: Self-pay

## 2023-10-11 DIAGNOSIS — R300 Dysuria: Secondary | ICD-10-CM | POA: Insufficient documentation

## 2023-10-11 DIAGNOSIS — B9689 Other specified bacterial agents as the cause of diseases classified elsewhere: Secondary | ICD-10-CM | POA: Insufficient documentation

## 2023-10-11 DIAGNOSIS — N3001 Acute cystitis with hematuria: Secondary | ICD-10-CM | POA: Insufficient documentation

## 2023-10-11 DIAGNOSIS — F32A Depression, unspecified: Secondary | ICD-10-CM | POA: Insufficient documentation

## 2023-10-11 DIAGNOSIS — E785 Hyperlipidemia, unspecified: Secondary | ICD-10-CM | POA: Insufficient documentation

## 2023-10-11 LAB — POCT URINALYSIS DIP (MANUAL ENTRY)
Glucose, UA: NEGATIVE mg/dL
Nitrite, UA: NEGATIVE
Protein Ur, POC: 100 mg/dL — AB
Spec Grav, UA: >=1.03 — AB (ref 1.010–1.025)
Urobilinogen, UA: 1 U/dL
pH, UA: 5.5 (ref 5.0–8.0)

## 2023-10-11 MED ORDER — CEFDINIR 300 MG PO CAPS: 300.0000 mg | ORAL_CAPSULE | Freq: Two times a day (BID) | ORAL | 0 refills | Status: DC

## 2023-10-11 NOTE — Discharge Instructions (Addendum)
Advised patient to take medication as directed with food to completion.  Encouraged to increase daily water intake to 64 ounces per day while taking this medication.  Advised we will follow-up with urine culture results once received.  Advised if symptoms worsen and/or unresolved please follow-up PCP or here for further evaluation.

## 2023-10-11 NOTE — ED Provider Notes (Signed)
[name]    CSN: [id number] Arrival date & time: 10/11/23  1730      History   Chief Complaint Chief Complaint  Patient presents with   UTI    HPI Cindy [name] is a 72 y.o. female.   HPI pleasant 72 year old female presents with possible UTI.  PMH significant for breast cancer, HLD, and depression.  Past Medical History:  Diagnosis Date   Arthritis    Cancer (HCC)    breast   Depression    GERD (gastroesophageal reflux disease)    Hyperlipidemia    Tendonitis     Patient Active Problem List   Diagnosis Date Noted   Intractable migraine without aura and without status migrainosus 10/06/2023   Intractable headache 09/27/2023   Rash, skin 06/10/2023   Mild cognitive impairment with memory loss 12/16/2022   Sebaceous cyst of labia 01/13/2022   MDD (major depressive disorder), recurrent episode (HCC) 05/23/2020   Blepharitis of both upper and lower eyelid 05/14/2020   Dermatochalasis of both upper eyelids 05/14/2020   Urinary frequency 11/16/2019   Cystitis 10/30/2019   Hypermetropia of both eyes 01/11/2018   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      Home Medications    Prior to Admission medications   Medication Sig Start Date End Date Taking? Authorizing Provider  buPROPion (WELLBUTRIN XL) 150 MG 24 hr tablet TAKE 3 TABLETS EVERY DAY 08/04/23  Yes Everrett Coombe, DO  cefdinir (OMNICEF) 300 MG capsule Take 1 capsule (300 mg total)  by mouth 2 (two) times daily for 7 days. 10/11/23 10/18/23 Yes Trevor Iha, FNP  clonazePAM (KLONOPIN) 1 MG tablet Take 1 tablet (1 mg total) by mouth 2 (two) times daily as needed. 08/04/23  Yes Everrett Coombe, DO  Multiple Vitamin (MULTIVITAMIN) tablet Take 1 tablet by mouth daily.   Yes [provider]  augmented betamethasone dipropionate (DIPROLENE-AF) 0.05 % cream APPLY SPARINGLY TO AFFECTED AREA TWICE A DAY 05/18/23   [provider]  Calcium Carbonate-Vit D-Min (CALCIUM 1200 PO) Take 1 tablet by mouth daily.    [provider]  clotrimazole-betamethasone (LOTRISONE) cream Apply 1 Application topically 2 (two) times daily. 06/10/23   Charlton Amor, DO  omeprazole (PRILOSEC) 40 MG capsule TAKE 1 CAPSULE EVERY DAY 04/21/23   Everrett Coombe, DO  PARoxetine (PAXIL) 20 MG tablet TAKE 2 TABLETS EVERY DAY 08/09/23   Everrett Coombe, DO  polyethylene glycol (MIRALAX / GLYCOLAX) 17 g packet Take 17 g by mouth as needed.    [provider]  predniSONE (DELTASONE) 10 MG tablet 8 tabs PO Day 1, 6 tabs Day 2, 4 tabs Day 3, 2 tabs Day 4, 1 tab Day 5. 10/06/23   Metheney, Barbarann Ehlers, MD  promethazine (PHENERGAN) 25 MG tablet Take 1 tablet (25 mg total) by mouth every 6 (six) hours as needed for nausea or vomiting. 10/01/23   Breeback, Jade L, PA-C  Rimegepant Sulfate (NURTEC) 75 MG  TBDP Take 1 tablet (75 mg total) by mouth daily as needed (migraine). 09/23/23   Christen Butter, NP  SUMAtriptan (IMITREX) 25 MG tablet Take 1 tablet (25 mg total) by mouth every 2 (two) hours as needed for migraine. May repeat in 2 hours if headache persists or recurs. Patient not taking: Reported on 10/06/2023 10/05/23   Everrett Coombe, DO  topiramate (TOPAMAX) 50 MG tablet Take 0.5 tablets (25 mg total) by mouth at bedtime for 7 days, THEN 0.5 tablets (25 mg total) 2 (two) times daily for 7 days, THEN 1 tablet (50 mg total) 2 (two) times daily for 16 days. 10/06/23 11/05/23  Agapito Games, MD   trimethoprim (TRIMPEX) 100 MG tablet TAKE 1 TABLET EVERY DAY 07/05/23   Everrett Coombe, DO  valACYclovir (VALTREX) 500 MG tablet TAKE 1 TABLET EVERY DAY Patient taking differently: as needed. 12/01/21   Everrett Coombe, DO    Family History Family History  Problem Relation Age of Onset   Cancer Mother        bone   Hypertension Father    Diabetes Father    Stroke Father    Hypertension Sister    Alzheimer's disease Brother    Hypertension Sister    Colon cancer Neg Hx    Esophageal cancer Neg Hx    Rectal cancer Neg Hx    Stomach cancer Neg Hx     Social History Social History   Tobacco Use   Smoking status: Never   Smokeless tobacco: Never  Vaping Use   Vaping status: Never Used  Substance Use Topics   Alcohol use: Not Currently    Comment: rarely   Drug use: No     Allergies   Patient has no known allergies.   Review of Systems Review of Systems  Genitourinary:  Positive for dysuria.  All other systems reviewed and are negative.    Physical Exam Triage Vital Signs ED Triage Vitals 10/11/23 1756  Encounter Vitals Group     BP      Systolic BP Percentile      Diastolic BP Percentile      Pulse      Resp      Temp      Temp src      SpO2      Weight      Height      Head Circumference      Peak Flow      Pain Score 0     Pain Loc      Pain Education      Exclude from Growth Chart    No data found.  Updated Vital Signs BP 122/76   Pulse 89   Temp 97.7 F (36.5 C)   Resp 18   SpO2 98%    Physical Exam Vitals and nursing note reviewed.  Constitutional:      Appearance: Normal appearance. She is normal weight.  HENT:     Head: Normocephalic and atraumatic.     Mouth/Throat:     Mouth: Mucous membranes are moist.     Pharynx: Oropharynx is clear.  Eyes:     Extraocular Movements: Extraocular movements intact.     Conjunctiva/sclera: Conjunctivae normal.     Pupils: Pupils are equal, round, and reactive to light.  Cardiovascular:      Rate and Rhythm: Normal rate and regular rhythm.     Pulses: Normal pulses.     Heart sounds: Normal heart sounds.  Pulmonary:  Effort: Pulmonary effort is normal.     Breath sounds: Normal breath sounds. No wheezing, rhonchi or rales.  Abdominal:     Tenderness: There is no right CVA tenderness or left CVA tenderness.  Musculoskeletal:        General: Normal range of motion.     Cervical back: Normal range of motion and neck supple.  Skin:    General: Skin is warm and dry.  Neurological:     General: No focal deficit present.     Mental Status: She is alert and oriented to person, place, and time. Mental status is at baseline.  Psychiatric:        Mood and Affect: Mood normal.        Behavior: Behavior normal.      UC Treatments / Results  Labs (all labs ordered are listed, but only abnormal results are displayed) Labs Reviewed  POCT URINALYSIS DIP (MANUAL ENTRY) - Abnormal; Notable for the following components:      Result Value   Clarity, UA cloudy (*)    Bilirubin, UA small (*)    Ketones, POC UA small (15) (*)    Spec Grav, UA >=1.030 (*)    Blood, UA moderate (*)    Protein Ur, POC =100 (*)    Leukocytes, UA Large (3+) (*)    All other components within normal limits  URINE CULTURE    EKG   Radiology No results found.  Procedures Procedures (including critical care time)  Medications Ordered in UC Medications - No data to display  Initial Impression / Assessment and Plan / UC Course  I have reviewed the triage vital signs and the nursing notes.  Pertinent labs & imaging results that were available during my care of the patient were reviewed by me and considered in my medical decision making (see chart for details).     MDM: 1.  Acute cystitis with hematuria-UA revealed above, urine culture ordered, Rx'd Cefdinir 300 mg capsule: Take 1 capsule twice daily x 7 days; 2.  Dysuria-UA revealed above, urine culture ordered, Rx'd Cefdinir 300 mg  capsule: Take 1 capsule twice daily x 7 days. Advised patient to take medication as directed with food to completion.  Encouraged to increase daily water intake to 64 ounces per day while taking this medication.  Advised we will follow-up with urine culture results once received.  Advised if symptoms worsen and/or unresolved please follow-up PCP or here for further evaluation.  Patient discharged home, hemodynamically stable.  Final Clinical Impressions(s) / UC Diagnoses   Final diagnoses:  Acute cystitis with hematuria     Discharge Instructions      Advised patient to take medication as directed with food to completion.  Encouraged to increase daily water intake to 64 ounces per day while taking this medication.  Advised we will follow-up with urine culture results once received.  Advised if symptoms worsen and/or unresolved please follow-up PCP or here for further evaluation.     ED Prescriptions     Medication Sig Dispense Auth. Provider   cefdinir (OMNICEF) 300 MG capsule Take 1 capsule (300 mg total) by mouth 2 (two) times daily for 7 days. 14 capsule Trevor Iha, FNP      PDMP not reviewed this encounter.   Trevor Iha, FNP 10/11/23 1844

## 2023-10-11 NOTE — ED Triage Notes (Signed)
Pt presents to [facility] with possible UTI.

## 2023-10-12 ENCOUNTER — Encounter: Payer: Self-pay | Admitting: Family Medicine

## 2023-10-12 ENCOUNTER — Ambulatory Visit (INDEPENDENT_AMBULATORY_CARE_PROVIDER_SITE_OTHER): Payer: Medicare Other | Admitting: Family Medicine

## 2023-10-12 VITALS — BP 105/66 | HR 84 | Ht 68.0 in | Wt 163.0 lb

## 2023-10-12 DIAGNOSIS — N390 Urinary tract infection, site not specified: Secondary | ICD-10-CM | POA: Insufficient documentation

## 2023-10-12 DIAGNOSIS — N3001 Acute cystitis with hematuria: Secondary | ICD-10-CM | POA: Diagnosis not present

## 2023-10-12 LAB — POCT URINALYSIS DIP (CLINITEK)
Glucose, UA: NEGATIVE mg/dL
Nitrite, UA: NEGATIVE
POC PROTEIN,UA: 30 — AB
Spec Grav, UA: 1.02 (ref 1.010–1.025)
Urobilinogen, UA: 1 U/dL
pH, UA: 8.5 — AB (ref 5.0–8.0)

## 2023-10-12 MED ORDER — CEFDINIR 300 MG PO CAPS: 300.0000 mg | ORAL_CAPSULE | Freq: Two times a day (BID) | ORAL | 0 refills | Status: AC

## 2023-10-12 MED ORDER — FLUCONAZOLE 150 MG PO TABS: 150.0000 mg | ORAL_TABLET | Freq: Once | ORAL | 0 refills | Status: DC

## 2023-10-12 MED ORDER — CEFTRIAXONE SODIUM 1 G IJ SOLR
1.0000 g | Freq: Once | INTRAMUSCULAR | Status: AC
  Administered 2023-10-12: 1 g via INTRAMUSCULAR

## 2023-10-12 NOTE — Assessment & Plan Note (Signed)
Given injection of rocephin in clinic today.  Re-sent omnicef for 7 day course.  Await culture.  Precautions discussed.

## 2023-10-12 NOTE — Progress Notes (Signed)
Cindy [name] - 72 y.o. female MRN [id number]  Date of birth: 23-Jan-1951  Subjective Chief Complaint  Patient presents with   Urinary Tract Infection    HPI Cindy [name] is a 72 y.o. female here today with possible UTI.  She has had dysuria and frequency for a couple of days.  She does have history of recurrent UTI and has seen urology for this in the past.  Her husband had noted altered mental status as well.  Seen at urgent care yesterday and prescribed cefdinir.  Urine culture is still pending.   ROS:  A comprehensive ROS was completed and negative except as noted per HPI  No Known Allergies  Past Medical History:  Diagnosis Date   Arthritis    Cancer (HCC)    breast   Depression    GERD (gastroesophageal reflux disease)    Hyperlipidemia    Tendonitis     Past Surgical History:  Procedure Laterality Date   BREAST SURGERY     right   ELBOW SURGERY     right    Social History   Socioeconomic History   Marital status: Married    Spouse name: Roe Coombs   Number of children: 0   Years of education: 18   Highest education level: Master's degree (e.g., MA, MS, MEng, MEd, MSW, MBA)  Occupational History   Occupation: Retired.  Tobacco Use   Smoking status: Never   Smokeless tobacco: Never  Vaping Use   Vaping status: Never Used  Substance and Sexual Activity   Alcohol use: Not Currently    Comment: rarely   Drug use: No   Sexual activity: Yes    Birth control/protection: Post-menopausal  Other Topics Concern   Not on file  Social History Narrative   Lives with her husband. They help take care of her brother's grandchildren. She enjoys singing in the choir and gardening.   Social Determinants of Health   Financial Resource Strain: Low Risk  (12/28/2022)   Overall Financial Resource Strain (CARDIA)    Difficulty of Paying Living Expenses: Not hard at all  Food Insecurity: No Food Insecurity (12/28/2022)   Hunger Vital Sign    Worried About Running Out of Food  in the Last Year: Never true    Ran Out of Food in the Last Year: Never true  Transportation Needs: No Transportation Needs (12/28/2022)   PRAPARE - Administrator, Civil Service (Medical): No    Lack of Transportation (Non-Medical): No  Physical Activity: Inactive (12/28/2022)   Exercise Vital Sign    Days of Exercise per Week: 0 days    Minutes of Exercise per Session: 0 min  Stress: No Stress Concern Present (12/28/2022)   [employer] of Occupational Health - Occupational Stress Questionnaire    Feeling of Stress : Not at all  Social Connections: Socially Integrated (12/28/2022)   Social Connection and Isolation Panel [NHANES]    Frequency of Communication with Friends and Family: More than three times a week    Frequency of Social Gatherings with Friends and Family: More than three times a week    Attends Religious Services: More than 4 times per year    Active Member of [employer] or Organizations: Yes    Attends Engineer, structural: More than 4 times per year    Marital Status: Married    Family History  Problem Relation Age of Onset   Cancer Mother        bone  Hypertension Father    Diabetes Father    Stroke Father    Hypertension Sister    Alzheimer's disease Brother    Hypertension Sister    Colon cancer Neg Hx    Esophageal cancer Neg Hx    Rectal cancer Neg Hx    Stomach cancer Neg Hx     Health Maintenance  Topic Date Due   COVID-19 Vaccine (7 - 2023-24 season) 08/15/2023   Medicare Annual Wellness (AWV)  12/29/2023   MAMMOGRAM  07/20/2024   DTaP/Tdap/Td (4 - Td or Tdap) 04/05/2028   Colonoscopy  08/28/2029   Pneumonia Vaccine 39+ Years old  Completed   INFLUENZA VACCINE  Completed   DEXA SCAN  Completed   Hepatitis C Screening  Completed   Zoster Vaccines- Shingrix  Completed   HPV VACCINES  Aged Out      ----------------------------------------------------------------------------------------------------------------------------------------------------------------------------------------------------------------- Physical Exam BP 105/66 (BP Location: Left Arm, Patient Position: Sitting, Cuff Size: Normal)   Pulse 84   Ht 5\' 8"  (1.727 m)   Wt 163 lb (73.9 kg)   SpO2 99%   BMI 24.78 kg/m   Physical Exam Constitutional:      Appearance: Normal appearance.  Eyes:     General: No scleral icterus. Neurological:     Mental Status: She is alert.     Comments: Slightly confused compared to baseline.   Psychiatric:        Mood and Affect: Mood normal.        Behavior: Behavior normal.     ------------------------------------------------------------------------------------------------------------------------------------------------------------------------------------------------------------------- Assessment and Plan  UTI (urinary tract infection) Given injection of rocephin in clinic today.  Re-sent omnicef for 7 day course.  Await culture.  Precautions discussed.    Meds ordered this encounter  Medications   cefdinir (OMNICEF) 300 MG capsule    Sig: Take 1 capsule (300 mg total) by mouth 2 (two) times daily for 7 days.    Dispense:  14 capsule    Refill:  0   fluconazole (DIFLUCAN) 150 MG tablet    Sig: Take 1 tablet (150 mg total) by mouth once for 1 dose.    Dispense:  1 tablet    Refill:  0   cefTRIAXone (ROCEPHIN) injection 1 g    Return in about 1 week (around 10/19/2023) for F/u UTI.    This visit occurred during the SARS-CoV-2 public health emergency.  Safety protocols were in place, including screening questions prior to the visit, additional usage of staff PPE, and extensive cleaning of exam room while observing appropriate contact time as indicated for disinfecting solutions.

## 2023-10-12 NOTE — Patient Instructions (Signed)
Omnicef sent to [facility].  Start this tonight.   See me again in 1 week.  Let me know if symptoms worsen.

## 2023-10-14 ENCOUNTER — Telehealth: Payer: Self-pay

## 2023-10-14 LAB — URINE CULTURE: Culture: 20000 — AB

## 2023-10-14 MED ORDER — NITROFURANTOIN MONOHYD MACRO 100 MG PO CAPS: 100.0000 mg | ORAL_CAPSULE | Freq: Two times a day (BID) | ORAL | 0 refills | Status: DC

## 2023-10-14 NOTE — Telephone Encounter (Signed)
Per protocol, pt to d/c Cefdinir and begin tx with Macrobid.  Attempted to reach patient x2 - on cell and home lines. LVM Rx sent to pharmacy on file.

## 2023-10-19 ENCOUNTER — Ambulatory Visit: Payer: BLUE CROSS/BLUE SHIELD | Admitting: Family Medicine

## 2023-10-20 ENCOUNTER — Ambulatory Visit: Payer: BLUE CROSS/BLUE SHIELD | Admitting: Family Medicine

## 2023-10-20 ENCOUNTER — Telehealth: Payer: Self-pay

## 2023-10-20 DIAGNOSIS — N390 Urinary tract infection, site not specified: Secondary | ICD-10-CM | POA: Diagnosis not present

## 2023-10-20 DIAGNOSIS — R350 Frequency of micturition: Secondary | ICD-10-CM | POA: Diagnosis not present

## 2023-10-20 NOTE — Telephone Encounter (Signed)
Pt was to start Macrobid after culture results showed resistance to Cefdinir 300mg .   Pt was contacted by both Urgent Care and [facility] concerning medication change. Spouse forgot to advise wife. Symptoms have increased. Pt possibly septic. Sent to [facility] for evaluation with possible treatment including IV ABX and fluids.

## 2023-10-21 DIAGNOSIS — N958 Other specified menopausal and perimenopausal disorders: Secondary | ICD-10-CM | POA: Diagnosis not present

## 2023-10-21 LAB — HM DEXA SCAN: HM Dexa Scan: NORMAL

## 2023-10-25 ENCOUNTER — Encounter: Payer: Self-pay | Admitting: Family Medicine

## 2023-10-28 ENCOUNTER — Other Ambulatory Visit: Payer: Self-pay | Admitting: Family Medicine

## 2023-10-28 DIAGNOSIS — G43019 Migraine without aura, intractable, without status migrainosus: Secondary | ICD-10-CM

## 2023-11-09 ENCOUNTER — Other Ambulatory Visit: Payer: Self-pay | Admitting: Family Medicine

## 2023-11-09 ENCOUNTER — Telehealth: Payer: Self-pay

## 2023-11-09 DIAGNOSIS — R21 Rash and other nonspecific skin eruption: Secondary | ICD-10-CM

## 2023-11-09 NOTE — Telephone Encounter (Signed)
Patient called office to see if she could get some medication for an UTI, patient is scheduled for tomorrow but would isn't feeling well today, please advise, thanks.

## 2023-11-09 NOTE — Telephone Encounter (Signed)
Copied from CRM [id number]. Topic: Clinical - Prescription Issue >> Nov 09, 2023  3:39 PM Fuller Mandril wrote: Reason for CRM: Received a call from [facility] - pt has been calling pharmacy to see if Rx for UTI has been filled and she does not have the order and wanted to see if it had been sent. Confirmed only the 1 Rx was sent so far.

## 2023-11-09 NOTE — Telephone Encounter (Signed)
Per [id number] agent - patient keeps calling the pharmacy for a UTI rx. Please review other note from [facility] Admin. Pt has hx of acute cystitis.

## 2023-11-10 ENCOUNTER — Encounter: Payer: Self-pay | Admitting: Family Medicine

## 2023-11-10 ENCOUNTER — Other Ambulatory Visit: Payer: Self-pay

## 2023-11-10 ENCOUNTER — Ambulatory Visit: Payer: Self-pay | Admitting: Family Medicine

## 2023-11-10 ENCOUNTER — Ambulatory Visit (INDEPENDENT_AMBULATORY_CARE_PROVIDER_SITE_OTHER): Payer: Medicare Other | Admitting: Family Medicine

## 2023-11-10 ENCOUNTER — Observation Stay (HOSPITAL_BASED_OUTPATIENT_CLINIC_OR_DEPARTMENT_OTHER)
Admission: EM | Admit: 2023-11-10 | Discharge: 2023-11-11 | Disposition: A | Discharge status: Home / Self Care | Payer: Medicare Other | Attending: Internal Medicine | Admitting: Internal Medicine

## 2023-11-10 ENCOUNTER — Encounter (HOSPITAL_BASED_OUTPATIENT_CLINIC_OR_DEPARTMENT_OTHER): Payer: Self-pay

## 2023-11-10 VITALS — BP 127/67 | HR 73 | Temp 97.4°F | Ht 68.0 in | Wt 163.0 lb

## 2023-11-10 DIAGNOSIS — E876 Hypokalemia: Secondary | ICD-10-CM | POA: Diagnosis not present

## 2023-11-10 DIAGNOSIS — N3001 Acute cystitis with hematuria: Secondary | ICD-10-CM

## 2023-11-10 DIAGNOSIS — B9629 Other Escherichia coli [E. coli] as the cause of diseases classified elsewhere: Secondary | ICD-10-CM

## 2023-11-10 DIAGNOSIS — R519 Headache, unspecified: Secondary | ICD-10-CM | POA: Diagnosis present

## 2023-11-10 DIAGNOSIS — N3000 Acute cystitis without hematuria: Principal | ICD-10-CM

## 2023-11-10 DIAGNOSIS — R112 Nausea with vomiting, unspecified: Secondary | ICD-10-CM | POA: Diagnosis present

## 2023-11-10 DIAGNOSIS — Z853 Personal history of malignant neoplasm of breast: Secondary | ICD-10-CM | POA: Diagnosis not present

## 2023-11-10 DIAGNOSIS — R5383 Other fatigue: Secondary | ICD-10-CM | POA: Diagnosis not present

## 2023-11-10 DIAGNOSIS — R5381 Other malaise: Secondary | ICD-10-CM

## 2023-11-10 DIAGNOSIS — G3184 Mild cognitive impairment, so stated: Secondary | ICD-10-CM | POA: Diagnosis not present

## 2023-11-10 DIAGNOSIS — F418 Other specified anxiety disorders: Secondary | ICD-10-CM | POA: Diagnosis not present

## 2023-11-10 DIAGNOSIS — R110 Nausea: Secondary | ICD-10-CM | POA: Diagnosis present

## 2023-11-10 DIAGNOSIS — R300 Dysuria: Secondary | ICD-10-CM | POA: Insufficient documentation

## 2023-11-10 DIAGNOSIS — G43909 Migraine, unspecified, not intractable, without status migrainosus: Secondary | ICD-10-CM | POA: Diagnosis not present

## 2023-11-10 DIAGNOSIS — Z79899 Other long term (current) drug therapy: Secondary | ICD-10-CM | POA: Diagnosis not present

## 2023-11-10 DIAGNOSIS — E860 Dehydration: Secondary | ICD-10-CM

## 2023-11-10 DIAGNOSIS — Z1612 Extended spectrum beta lactamase (ESBL) resistance: Secondary | ICD-10-CM | POA: Diagnosis not present

## 2023-11-10 DIAGNOSIS — D649 Anemia, unspecified: Secondary | ICD-10-CM | POA: Insufficient documentation

## 2023-11-10 DIAGNOSIS — N390 Urinary tract infection, site not specified: Secondary | ICD-10-CM

## 2023-11-10 DIAGNOSIS — A499 Bacterial infection, unspecified: Secondary | ICD-10-CM

## 2023-11-10 LAB — POCT URINALYSIS DIP (CLINITEK)
Bilirubin, UA: NEGATIVE
Glucose, UA: NEGATIVE mg/dL
Nitrite, UA: NEGATIVE
POC PROTEIN,UA: NEGATIVE
Spec Grav, UA: 1.015 (ref 1.010–1.025)
Urobilinogen, UA: 0.2 U/dL
pH, UA: 7.5 (ref 5.0–8.0)

## 2023-11-10 LAB — URINALYSIS, ROUTINE W REFLEX MICROSCOPIC
Bilirubin Urine: NEGATIVE
Glucose, UA: NEGATIVE mg/dL
Ketones, ur: 40 mg/dL — AB
Nitrite: NEGATIVE
Protein, ur: NEGATIVE mg/dL
Specific Gravity, Urine: 1.015 (ref 1.005–1.030)
pH: 7 (ref 5.0–8.0)

## 2023-11-10 LAB — CBC WITH DIFFERENTIAL/PLATELET
Abs Immature Granulocytes: 0.02 10*3/uL (ref 0.00–0.07)
Basophils Absolute: 0 10*3/uL (ref 0.0–0.1)
Basophils Relative: 0 %
Eosinophils Absolute: 0 10*3/uL (ref 0.0–0.5)
Eosinophils Relative: 0 %
HCT: 36.8 % (ref 36.0–46.0)
Hemoglobin: 11.6 g/dL — ABNORMAL LOW (ref 12.0–15.0)
Immature Granulocytes: 0 %
Lymphocytes Relative: 11 %
Lymphs Abs: 0.9 10*3/uL (ref 0.7–4.0)
MCH: 26.3 pg (ref 26.0–34.0)
MCHC: 31.5 g/dL (ref 30.0–36.0)
MCV: 83.4 fL (ref 80.0–100.0)
Monocytes Absolute: 0.5 10*3/uL (ref 0.1–1.0)
Monocytes Relative: 6 %
Neutro Abs: 7 10*3/uL (ref 1.7–7.7)
Neutrophils Relative %: 83 %
Platelets: 400 10*3/uL (ref 150–400)
RBC: 4.41 MIL/uL (ref 3.87–5.11)
RDW: 14.7 % (ref 11.5–15.5)
WBC: 8.5 10*3/uL (ref 4.0–10.5)
nRBC: 0 % (ref 0.0–0.2)

## 2023-11-10 LAB — I-STAT CHEM 8, ED
BUN: 3 mg/dL — ABNORMAL LOW (ref 8–23)
Calcium, Ion: 1.12 mmol/L — ABNORMAL LOW (ref 1.15–1.40)
Chloride: 104 mmol/L (ref 98–111)
Creatinine, Ser: 0.9 mg/dL (ref 0.44–1.00)
Glucose, Bld: 128 mg/dL — ABNORMAL HIGH (ref 70–99)
HCT: 37 % (ref 36.0–46.0)
Hemoglobin: 12.6 g/dL (ref 12.0–15.0)
Potassium: 3.4 mmol/L — ABNORMAL LOW (ref 3.5–5.1)
Sodium: 142 mmol/L (ref 135–145)
TCO2: 22 mmol/L (ref 22–32)

## 2023-11-10 LAB — LACTIC ACID, PLASMA
Lactic Acid, Venous: 1.4 mmol/L (ref 0.5–1.9)
Lactic Acid, Venous: 1.1 mmol/L (ref 0.5–1.9)

## 2023-11-10 LAB — URINALYSIS, MICROSCOPIC (REFLEX)

## 2023-11-10 MED ORDER — POLYETHYLENE GLYCOL 3350 17 G PO PACK: 17.0000 g | PACK | Freq: Every day | ORAL | Status: DC | PRN

## 2023-11-10 MED ORDER — ENOXAPARIN SODIUM 40 MG/0.4ML IJ SOSY
40.0000 mg | PREFILLED_SYRINGE | INTRAMUSCULAR | Status: DC
  Administered 2023-11-11: 40 mg via SUBCUTANEOUS
  Filled 2023-11-10: qty 0.4

## 2023-11-10 MED ORDER — ACETAMINOPHEN 325 MG PO TABS: 650.0000 mg | ORAL_TABLET | Freq: Four times a day (QID) | ORAL | Status: DC | PRN

## 2023-11-10 MED ORDER — PROCHLORPERAZINE EDISYLATE 10 MG/2ML IJ SOLN: 5.0000 mg | Freq: Four times a day (QID) | INTRAMUSCULAR | Status: DC | PRN

## 2023-11-10 MED ORDER — BUPROPION HCL ER (XL) 150 MG PO TB24
150.0000 mg | ORAL_TABLET | Freq: Every day | ORAL | Status: DC
  Administered 2023-11-11: 150 mg via ORAL
  Filled 2023-11-10: qty 1

## 2023-11-10 MED ORDER — OXYCODONE HCL 5 MG PO TABS: 5.0000 mg | ORAL_TABLET | ORAL | Status: DC | PRN

## 2023-11-10 MED ORDER — SODIUM CHLORIDE 0.9 % IV SOLN
1.0000 g | Freq: Three times a day (TID) | INTRAVENOUS | Status: DC
  Administered 2023-11-10 – 2023-11-11 (×2): 1 g via INTRAVENOUS
  Filled 2023-11-10 (×3): qty 20

## 2023-11-10 MED ORDER — PAROXETINE HCL 20 MG PO TABS
40.0000 mg | ORAL_TABLET | Freq: Every day | ORAL | Status: DC
  Administered 2023-11-11: 40 mg via ORAL
  Filled 2023-11-10: qty 2

## 2023-11-10 MED ORDER — SODIUM CHLORIDE 0.9 % IV SOLN: INTRAVENOUS | Status: DC | PRN

## 2023-11-10 MED ORDER — POTASSIUM CHLORIDE CRYS ER 20 MEQ PO TBCR
20.0000 meq | EXTENDED_RELEASE_TABLET | Freq: Once | ORAL | Status: AC
  Administered 2023-11-10: 20 meq via ORAL
  Filled 2023-11-10: qty 1

## 2023-11-10 MED ORDER — LACTATED RINGERS IV BOLUS
1000.0000 mL | Freq: Once | INTRAVENOUS | Status: AC
  Administered 2023-11-10: 1000 mL via INTRAVENOUS

## 2023-11-10 MED ORDER — ACETAMINOPHEN 650 MG RE SUPP: 650.0000 mg | Freq: Four times a day (QID) | RECTAL | Status: DC | PRN

## 2023-11-10 NOTE — H&P (Signed)
History and Physical    HILTON ALGIERE NWG:[id number] DOB: 1950/12/22 DOA: 11/10/2023  PCP: Everrett Coombe, DO   Patient coming from: Hom e  Chief Complaint: Dysuria, generalized weakness, N/V   HPI: Cindy [name] is a 72 y.o. female with medical history significant for depression, anxiety, mild cognitive impairment, chronic headaches, and recurrent UTIs who presents with dysuria, generalized weakness, nausea, and vomiting.  Patient was seen for similar symptoms a month ago, was diagnosed with UTI, and culture grew ESBL.  She had been prescribed nitrofurantoin at that time but never filled it.  She was seen in the clinic again today with worsening dysuria, weakness, and now nausea with nonbloody vomiting as well.  She denies flank pain, fever, or chills.  [facility] ED Course: Upon arrival to the ED, patient is found to be afebrile and saturating well on room air with normal heart rate and stable blood pressure.  Labs are most notable for normal WBC and normal lactate.  Blood and urine cultures were collected in the ED and the patient was given a liter of LR and meropenem.  She was transferred to [facility] for admission.  Review of Systems:  All other systems reviewed and apart from HPI, are negative.  Past Medical History:  Diagnosis Date   Arthritis    Cancer (HCC)    breast   Depression    GERD (gastroesophageal reflux disease)    Hyperlipidemia    Tendonitis     Past Surgical History:  Procedure Laterality Date   BREAST SURGERY     right   ELBOW SURGERY     right    Social History:   reports that she has never smoked. She has never used smokeless tobacco. She reports that she does not currently use alcohol. She reports that she does not use drugs.  No Known Allergies  Family History  Problem Relation Age of Onset   Cancer Mother        bone   Hypertension Father    Diabetes Father    Stroke Father    Hypertension Sister    Alzheimer's disease Brother     Hypertension Sister    Colon cancer Neg Hx    Esophageal cancer Neg Hx    Rectal cancer Neg Hx    Stomach cancer Neg Hx      Prior to Admission medications   Medication Sig Start Date End Date Taking? Authorizing Provider  augmented betamethasone dipropionate (DIPROLENE-AF) 0.05 % cream APPLY SPARINGLY TO AFFECTED AREA TWICE A DAY 05/18/23   [provider]  buPROPion (WELLBUTRIN XL) 150 MG 24 hr tablet TAKE 3 TABLETS EVERY DAY 08/04/23   Everrett Coombe, DO  Calcium Carbonate-Vit D-Min (CALCIUM 1200 PO) Take 1 tablet by mouth daily.    [provider]  clonazePAM (KLONOPIN) 1 MG tablet Take 1 tablet (1 mg total) by mouth 2 (two) times daily as needed. 08/04/23   Everrett Coombe, DO  clotrimazole-betamethasone (LOTRISONE) cream APPLY TO AFFECTED AREA TWICE A DAY 11/09/23   Everrett Coombe, DO  fluconazole (DIFLUCAN) 150 MG tablet Take 150 mg by mouth once. Patient not taking: Reported on 11/10/2023 11/09/23   [provider]  Multiple Vitamin (MULTIVITAMIN) tablet Take 1 tablet by mouth daily.    [provider]  nitrofurantoin, macrocrystal-monohydrate, (MACROBID) 100 MG capsule Take 1 capsule (100 mg total) by mouth 2 (two) times daily. Patient not taking: Reported on 11/10/2023 10/14/23   Merrilee Jansky, MD  omeprazole (PRILOSEC)  40 MG capsule TAKE 1 CAPSULE EVERY DAY Patient not taking: Reported on 11/10/2023 04/21/23   Everrett Coombe, DO  PARoxetine (PAXIL) 20 MG tablet TAKE 2 TABLETS EVERY DAY Patient not taking: Reported on 11/10/2023 08/09/23   Everrett Coombe, DO  polyethylene glycol (MIRALAX / GLYCOLAX) 17 g packet Take 17 g by mouth as needed.    [provider]  promethazine (PHENERGAN) 25 MG tablet Take 1 tablet (25 mg total) by mouth every 6 (six) hours as needed for nausea or vomiting. 10/01/23   Breeback, Jade L, PA-C  Rimegepant Sulfate (NURTEC) 75 MG TBDP Take 1 tablet (75 mg total) by mouth daily as needed (migraine). 09/23/23    Christen Butter, NP  SUMAtriptan (IMITREX) 25 MG tablet Take 1 tablet (25 mg total) by mouth every 2 (two) hours as needed for migraine. May repeat in 2 hours if headache persists or recurs. Patient not taking: Reported on 11/10/2023 10/05/23   Everrett Coombe, DO  topiramate (TOPAMAX) 50 MG tablet TAKE 0.5 TABLETS (25 MG TOTAL) BY MOUTH AT BEDTIME FOR 7 DAYS, THEN 0.5 TABLETS (25 MG TOTAL) 2 (TWO) TIMES DAILY FOR 7 DAYS, THEN 1 TABLET (50 MG TOTAL) 2 (TWO) TIMES DAILY FOR 16 DAYS. Patient not taking: Reported on 11/10/2023 10/29/23 11/27/23  Everrett Coombe, DO  trimethoprim (TRIMPEX) 100 MG tablet TAKE 1 TABLET EVERY DAY 07/05/23   Everrett Coombe, DO  valACYclovir (VALTREX) 500 MG tablet TAKE 1 TABLET EVERY DAY Patient not taking: Reported on 11/10/2023 12/01/21   Everrett Coombe, DO    Physical Exam: Vitals:   11/10/23 1538 11/10/23 1844 11/10/23 2037 11/10/23 2117  BP: 104/75 110/70 134/62 (!) 150/72  Pulse: 80 75 87 74  Resp: 18 16  16   Temp: 97.7 F (36.5 C) 97.9 F (36.6 C)  98.3 F (36.8 C)  TempSrc:  Oral  Oral  SpO2: 96% 97% 100% 96%  Weight:      Height:        Constitutional: NAD, calm  Eyes: PERTLA, lids and conjunctivae normal ENMT: Mucous membranes are moist. Posterior pharynx clear of any exudate or lesions.   Neck: supple, no masses  Respiratory: no wheezing, no crackles. No accessory muscle use.  Cardiovascular: S1 & S2 heard, regular rate and rhythm. No extremity edema.   Abdomen: No distension, no tenderness, soft. Bowel sounds active.  Musculoskeletal: no clubbing / cyanosis. No joint deformity upper and lower extremities.   Skin: no significant rashes, lesions, ulcers. Warm, dry, well-perfused. Neurologic: CN 2-12 grossly intact. Moving all extremities. Alert and oriented.  Psychiatric: Pleasant. Cooperative.    Labs and Imaging on Admission: I have personally reviewed following labs and imaging studies  CBC: Recent Labs  Lab 11/10/23 1546 11/10/23 1551   WBC 8.5  --   NEUTROABS 7.0  --   HGB 11.6* 12.6  HCT 36.8 37.0  MCV 83.4  --   PLT 400  --    Basic Metabolic Panel: Recent Labs  Lab 11/10/23 1551  NA 142  K 3.4*  CL 104  GLUCOSE 128*  BUN 3*  CREATININE 0.90   GFR: Estimated Creatinine Clearance: 58.7 mL/min (by C-G formula based on SCr of 0.9 mg/dL). Liver Function Tests: No results for input(s): "AST", "ALT", "ALKPHOS", "BILITOT", "PROT", "ALBUMIN" in the last 168 hours. No results for input(s): "LIPASE", "AMYLASE" in the last 168 hours. No results for input(s): "AMMONIA" in the last 168 hours. Coagulation Profile: No results for input(s): "INR", "PROTIME" in the last 168 hours.  Cardiac Enzymes: No results for input(s): "CKTOTAL", "CKMB", "CKMBINDEX", "TROPONINI" in the last 168 hours. BNP (last 3 results) No results for input(s): "PROBNP" in the last 8760 hours. HbA1C: No results for input(s): "HGBA1C" in the last 72 hours. CBG: No results for input(s): "GLUCAP" in the last 168 hours. Lipid Profile: No results for input(s): "CHOL", "HDL", "LDLCALC", "TRIG", "CHOLHDL", "LDLDIRECT" in the last 72 hours. Thyroid Function Tests: No results for input(s): "TSH", "T4TOTAL", "FREET4", "T3FREE", "THYROIDAB" in the last 72 hours. Anemia Panel: No results for input(s): "VITAMINB12", "FOLATE", "FERRITIN", "TIBC", "IRON", "RETICCTPCT" in the last 72 hours. Urine analysis:    Component Value Date/Time   COLORURINE YELLOW 11/10/2023 1546   APPEARANCEUR HAZY (A) 11/10/2023 1546   LABSPEC 1.015 11/10/2023 1546   PHURINE 7.0 11/10/2023 1546   GLUCOSEU NEGATIVE 11/10/2023 1546   HGBUR TRACE (A) 11/10/2023 1546   BILIRUBINUR NEGATIVE 11/10/2023 1546   BILIRUBINUR negative 11/10/2023 1335   BILIRUBINUR neg 12/20/2019 1016   KETONESUR 40 (A) 11/10/2023 1546   KETONESUR moderate (40) (A) 11/10/2023 1335   PROTEINUR NEGATIVE 11/10/2023 1546   UROBILINOGEN 0.2 11/10/2023 1335   UROBILINOGEN 0.2 01/12/2011 1301   NITRITE  NEGATIVE 11/10/2023 1546   NITRITE Negative 11/10/2023 1335   LEUKOCYTESUR LARGE (A) 11/10/2023 1546   LEUKOCYTESUR Small (1+) (A) 11/10/2023 1335   Sepsis Labs: @LABRCNTIP (procalcitonin:4,lacticidven:4) )No results found for this or any previous visit (from the past 240 hour(s)).   Radiological Exams on Admission: No results found.   Assessment/Plan  1. UTI d/t ESBL  - Started on meropenem in ED  - Continue meropenem, follow cultures and clinical course    2. Depression, anxiety  - Continue Wellbutrin and Paxil   3. Mild cognitive impairment  - Use delirium precautions   4. Hypokalemia  - Replacing    DVT prophylaxis: Lovenox  Code Status: Full  Level of Care: Level of care: Med-Surg Family Communication: Husband at bedside  Disposition Plan:  Patient is from: home  Anticipated d/c is to: Home  Anticipated d/c date is: 11/13/23  Patient currently: Pending treatment of UTI  Consults called: None  Admission status: Inpatient     Briscoe Deutscher, MD [facility]  11/10/2023, 11:09 PM

## 2023-11-10 NOTE — ED Notes (Signed)
LR withheld due to incompatibility with ABX

## 2023-11-10 NOTE — ED Notes (Signed)
[facility] called for transport , No Current ETA.. ED Nurse will call floor for report Called @ 20:13

## 2023-11-10 NOTE — ED Notes (Signed)
ED TO INPATIENT HANDOFF REPORT  ED Nurse Name and Phone #: Dominga Ferry [facility] Paramedic [phone]  S Name/Age/Gender Cindy [name] 72 y.o. female Room/Bed: MH12/MH12  Code Status   Code Status: Not on file  Home/SNF/Other Home Patient oriented to: self, place, time, and situation Is this baseline? Yes   Triage Complete: Triage complete  Chief Complaint UTI due to extended-spectrum beta lactamase (ESBL) producing Escherichia coli [N39.0, B96.29, Z16.12]  Triage Note Pt was seen by PCP today and was dx with UTI sent here for IV infusion for the UTI.  Pt has been on Macrobid and states it was not strong enough.  Pt has been dealing with these symptoms on/off x 5 weeks   Allergies No Known Allergies  Level of Care/Admitting Diagnosis ED Disposition     ED Disposition  Admit   Condition  --   Comment  Hospital Area: [facility] [[id number]]  Level of Care: Med-Surg [16]  May admit patient to [facility] or [facility] if equivalent level of care is available:: Yes  Interfacility transfer: Yes  Covid Evaluation: Asymptomatic - no recent exposure (last 10 days) testing not required  Diagnosis: UTI due to extended-spectrum beta lactamase (ESBL) producing Escherichia coli [[id number]]  Admitting Physician: Angie Fava [[id number]]  Attending Physician: Angie Fava [[id number]]  Certification:: I certify this patient will need inpatient services for at least 2 midnights  Expected Medical Readiness: 11/12/2023          B Medical/Surgery History Past Medical History:  Diagnosis Date   Arthritis    Cancer (HCC)    breast   Depression    GERD (gastroesophageal reflux disease)    Hyperlipidemia    Tendonitis    Past Surgical History:  Procedure Laterality Date   BREAST SURGERY     right   ELBOW SURGERY     right     A IV Location/Drains/Wounds Patient Lines/Drains/Airways Status     Active Line/Drains/Airways     Name Placement  date Placement time Site Days   Peripheral IV 11/10/23 20 G Left Antecubital 11/10/23  1545  Antecubital  less than 1            Intake/Output Last 24 hours  Intake/Output Summary (Last 24 hours) at 11/10/2023 2019 Last data filed at 11/10/2023 1942 Gross per 24 hour  Intake 2003.12 ml  Output --  Net 2003.12 ml    Labs/Imaging Results for orders placed or performed during the hospital encounter of 11/10/23 (from the past 48 hour(s))  CBC with Differential     Status: Abnormal   Collection Time: 11/10/23  3:46 PM  Result Value Ref Range   WBC 8.5 4.0 - 10.5 K/uL   RBC 4.41 3.87 - 5.11 MIL/uL   Hemoglobin 11.6 (L) 12.0 - 15.0 g/dL   HCT 24.4 01.0 - 27.2 %   MCV 83.4 80.0 - 100.0 fL   MCH 26.3 26.0 - 34.0 pg   MCHC 31.5 30.0 - 36.0 g/dL   RDW 53.6 64.4 - 03.4 %   Platelets 400 150 - 400 K/uL   nRBC 0.0 0.0 - 0.2 %   Neutrophils Relative % 83 %   Neutro Abs 7.0 1.7 - 7.7 K/uL   Lymphocytes Relative 11 %   Lymphs Abs 0.9 0.7 - 4.0 K/uL   Monocytes Relative 6 %   Monocytes Absolute 0.5 0.1 - 1.0 K/uL   Eosinophils Relative 0 %   Eosinophils Absolute 0.0 0.0 - 0.5  K/uL   Basophils Relative 0 %   Basophils Absolute 0.0 0.0 - 0.1 K/uL   Immature Granulocytes 0 %   Abs Immature Granulocytes 0.02 0.00 - 0.07 K/uL    Comment: Performed at [facility], [address] [place], [place] [address]  Urinalysis, Routine w reflex microscopic -Urine, Clean Catch     Status: Abnormal   Collection Time: 11/10/23  3:46 PM  Result Value Ref Range   Color, Urine YELLOW YELLOW   APPearance HAZY (A) CLEAR   Specific Gravity, Urine 1.015 1.005 - 1.030   pH 7.0 5.0 - 8.0   Glucose, UA NEGATIVE NEGATIVE mg/dL   Hgb urine dipstick TRACE (A) NEGATIVE   Bilirubin Urine NEGATIVE NEGATIVE   Ketones, ur 40 (A) NEGATIVE mg/dL   Protein, ur NEGATIVE NEGATIVE mg/dL   Nitrite NEGATIVE NEGATIVE   Leukocytes,Ua LARGE (A) NEGATIVE    Comment: Performed at [facility], [address] [place], [place] [address]  Urinalysis, Microscopic (reflex)     Status: Abnormal   Collection Time: 11/10/23  3:46 PM  Result Value Ref Range   RBC / HPF 0-5 0 - 5 RBC/hpf   WBC, UA 21-50 0 - 5 WBC/hpf   Bacteria, UA FEW (A) NONE SEEN   Squamous Epithelial / HPF 6-10 0 - 5 /HPF    Comment: Performed at [facility], [address] [place], [place] [address]  I-stat chem 8, ED (not at [facility] or [facility])     Status: Abnormal   Collection Time: 11/10/23  3:51 PM  Result Value Ref Range   Sodium 142 135 - 145 mmol/L   Potassium 3.4 (L) 3.5 - 5.1 mmol/L   Chloride 104 98 - 111 mmol/L   BUN 3 (L) 8 - 23 mg/dL   Creatinine, Ser 8.65 0.44 - 1.00 mg/dL   Glucose, Bld 784 (H) 70 - 99 mg/dL    Comment: Glucose reference range applies only to samples taken after fasting for at least 8 hours.   Calcium, Ion 1.12 (L) 1.15 - 1.40 mmol/L   TCO2 22 22 - 32 mmol/L   Hemoglobin 12.6 12.0 - 15.0 g/dL   HCT 69.6 29.5 - 28.4 %  Lactic acid, plasma     Status: None   Collection Time: 11/10/23  5:20 PM  Result Value Ref Range   Lactic Acid, Venous 1.4 0.5 - 1.9 mmol/L    Comment: Performed at [facility], [address] [place], [place] [address]   No results found.  Pending Labs Unresulted Labs (From admission, onward)     Start     Ordered   11/10/23 1708  Lactic acid, plasma  Now then every 2 hours,   R (with STAT occurrences)      11/10/23 1707   11/10/23 1656  Culture, blood (routine x 2)  BLOOD CULTURE X 2,   STAT      11/10/23 1656   11/10/23 1547  Urine Culture  Once,   URGENT       Question Answer Comment  Indication Dysuria   Patient immune status Normal   Release to patient Immediate      11/10/23 1546            Vitals/Pain Today's Vitals   11/10/23 1538 11/10/23 1538 11/10/23 1844  BP:  104/75 110/70  Pulse:  80 75  Resp:  18 16  Temp:  97.7 F (36.5 C)  97.9 F (36.6 C)  TempSrc:   Oral  SpO2:  96% 97%  Weight: 148 lb (67.1  kg)    Height: 5\' 8"  (1.727 m)    PainSc: 0-No pain      Isolation Precautions No active isolations  Medications Medications  meropenem (MERREM) 1 g in sodium chloride 0.9 % 100 mL IVPB (0 g Intravenous Stopped 11/10/23 1825)  0.9 %  sodium chloride infusion ( Intravenous Paused 11/10/23 1821)  lactated ringers bolus 1,000 mL ( Intravenous Stopped 11/10/23 1941)    Mobility walks     Focused Assessments     R Recommendations: See Admitting Provider Note  Report given to:   Additional Notes:

## 2023-11-10 NOTE — ED Notes (Signed)
Blood cultures obtained prior to ABX administration

## 2023-11-10 NOTE — Telephone Encounter (Signed)
Chief Complaint: nausea and vomiting Symptoms: nausea, 3 episodes of vomiting, generalized weakness, aches in joints Frequency: since last night Pertinent Negatives: Patient denies fevers, blood or coffee grounds in emesis, sore throat, dark urine, abdominal pain Disposition: [] ED /[] Urgent Care (no appt availability in office) / [x] Appointment(In office/virtual)/ []  [facility]/ [] Home Care/ [] Refused Recommended Disposition /[] [facility]/ []  Follow-up with PCP Additional Notes: Patient and husband called in requesting a phenergan prescription be called in for nausea and vomiting since last night. Pt has an appt today at 1:50p with Dr Ashley Royalty. Pt states she has had 3 episodes of vomiting since last night; has not eaten but it sipping clear liquids. Pt and husband state they are getting ready to head to PCP appt. Instructed patient if she became worse to go to ED or UC.   Copied from CRM [id number]. Topic: Clinical - Red Word Triage >> Nov 10, 2023 12:11 PM Prudencio Pair wrote: Red Word that prompted transfer to Nurse Triage: Pt states she is vomiting. Says Dr. Ashley Royalty was suppose to send rxs for phenergan and suppositories to pharmacy. Not seeing anything was sent yet. Also states her husband is currently at the pharmacy. Reason for Disposition  [1] MILD or MODERATE vomiting AND [2] present > 48 hours (2 days) (Exception: Mild vomiting with associated diarrhea.)  [1] MODERATE vomiting (e.g., 3 - 5 times/day) AND [2] age > 60 years  Answer Assessment - Initial Assessment Questions 1. NAUSEA SEVERITY: "How bad is the nausea?" (e.g., mild, moderate, severe; dehydration, weight loss)   - MILD: loss of appetite without change in eating habits   - MODERATE: decreased oral intake without significant weight loss, dehydration, or malnutrition   - SEVERE: inadequate caloric or fluid intake, significant weight loss, symptoms of dehydration     Moderate  2. ONSET: "When did the  nausea begin?"     Pt states nausea started last night  3. VOMITING: "Any vomiting?" If Yes, ask: "How many times today?"     Yes, pt states she has had 3 episodes of vomiting since last night. Not eating any food, just attempting to keep clear liquids now.  4. RECURRENT SYMPTOM: "Have you had nausea before?" If Yes, ask: "When was the last time?" "What happened that time?"     "Yes I have had it in the past before, about 5-7 days ago had one episode of this and it stopped then came back"  5. CAUSE: "What do you think is causing the nausea?"     Unsure. Denies any expired or spoiled food, exposure to anyone with illness.  Answer Assessment - Initial Assessment Questions 1. VOMITING SEVERITY: "How many times have you vomited in the past 24 hours?"     - MILD:  1 - 2 times/day    - MODERATE: 3 - 5 times/day, decreased oral intake without significant weight loss or symptoms of dehydration    - SEVERE: 6 or more times/day, vomits everything or nearly everything, with significant weight loss, symptoms of dehydration      Moderate  2. ONSET: "When did the vomiting begin?"      Since last night  3. FLUIDS: "What fluids or food have you vomited up today?" "Have you been able to keep any fluids down?"     Pt states she has not eaten anything, just having sips of water  4. ABDOMEN PAIN: "Are your having any abdomen pain?" If Yes : "How bad is it and what does it  feel like?" (e.g., crampy, dull, intermittent, constant)      No  5. DIARRHEA: "Is there any diarrhea?" If Yes, ask: "How many times today?"      No  6. CONTACTS: "Is there anyone else in the family with the same symptoms?"      Husband at home with pt and denies any symptoms  7. CAUSE: "What do you think is causing your vomiting?"     Unsure  8. HYDRATION STATUS: "Any signs of dehydration?" (e.g., dry mouth [not only dry lips], too weak to stand) "When did you last urinate?"     No issues with urine, states she is still  hydrated  9. OTHER SYMPTOMS: "Do you have any other symptoms?" (e.g., fever, headache, vertigo, vomiting blood or coffee grounds, recent head injury)     Generalized weakness, ache in joints  Protocols used: Nausea-A-AH, Vomiting-A-AH

## 2023-11-10 NOTE — Progress Notes (Signed)
[facility] service was paged in regards to pt's arrival to room.

## 2023-11-10 NOTE — Patient Instructions (Signed)
[address], [place], [place] [address]  [phone]

## 2023-11-10 NOTE — Progress Notes (Signed)
Hospitalist Transfer Note:    Nursing staff, Please call [facility] Admits & Consults System-Wide number on Amion [phone]) as soon as patient's arrival, so appropriate admitting provider can evaluate the pt.   Transferring facility: [facility] Requesting provider: Dr. Donnald Garre (EDP at [facility]) Reason for transfer: admission for further evaluation and management of ESBL UTI.      72  year old F who presented to [facility] ED complaining of generalized weakness.   Patient has been experiencing dysuria over the last 2 weeks, and was diagnosed with a urinary tract infection as an outpatient shortly after onset of the symptoms.  She was subsequently treated with a course of cefdinir, but is continue to experience persistence of these urinary symptoms.   Ultimately, her outpatient urine culture was positive for ESBL.  PCP has reportedly been unsuccessful in reaching the patient to update her on the above urine culture result, with associated plan to initiate Macrobid, which has not yet been started.   Over the last 2 3 days, the patient has experienced generalized weakness, fatigue, malaise, resulting in her spending the majority of the last 1 to 2 days in bed, which is a significant change relative to her baseline activity level, and ultimately led to her presenting to [facility] today for further evaluation management thereof  Vital signs in the ED were notable for the following: Afebrile, normotensive.  Labs were notable for nonelevated lactic acid.  Based upon the sensitivities associated with her ESBL UTI via outpatient urine culture, she received a dose of meropenem that [facility] today.   Subsequently, I accepted this patient for transfer for inpatient admission to a MedSurg bed at [facility] or [facility] (first available) for further work-up and management of the above.      Newton Pigg, DO Hospitalist

## 2023-11-10 NOTE — Assessment & Plan Note (Addendum)
Recent culture with ESBL.  Never picked up macrobid despite contacting x2 instructing to start this.  VSS however with her nausea and vomiting, dehydration from poor PO intake and untreated ESBL I think she needs evaluation in ED with consideration of IV fluids and IV antibiotics.  She will go to [facility] ED. Will have MA contact charge nurse for warm handoff of patients pending arrival.

## 2023-11-10 NOTE — ED Provider Notes (Addendum)
[place] EMERGENCY DEPARTMENT AT [facility] [place] Provider Note   CSN: [id number] Arrival date & time: 11/10/23  1444     History  No chief complaint on file.   Cindy [name] is a 72 y.o. female.  HPI Patient is referred to the emergency department from her family medicine provider.  She had recent culture with ESBL.  Patient had been prescribed Macrobid but did not experience improvement.  She has had nausea and vomiting and by report become dehydrated with poor oral intake.  She is referred to the emergency department for evaluation for IV fluids and IV antibiotics.  Patient reports that she has had worsening malaise and weakness over the past week.  She reports that her energy levels been extremely low now and she is mostly resting in bed.  Patient's husband notes that at 1 point she exhibited some mild confusion.  Patient's baseline is very healthy and active.  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  At baseline she is compliant with medications and eats a healthy diet and exercises.  She reports is very unusual for her to have this degree of malaise and any kind of confusion.  Where she has had urinary urgency.  She has not had much back or flank pain.    Home Medications Prior to Admission medications   Medication Sig Start Date End Date Taking? Authorizing Provider  augmented betamethasone dipropionate (DIPROLENE-AF) 0.05 % cream APPLY SPARINGLY TO AFFECTED AREA TWICE A DAY 05/18/23   [provider]  buPROPion (WELLBUTRIN XL) 150 MG 24 hr tablet TAKE 3 TABLETS EVERY DAY 08/04/23   Everrett Coombe, DO  Calcium Carbonate-Vit D-Min (CALCIUM 1200 PO) Take 1 tablet by mouth daily.    [provider]  clonazePAM (KLONOPIN) 1 MG tablet Take 1 tablet (1 mg total) by mouth 2 (two) times daily as needed. 08/04/23   Everrett Coombe, DO  clotrimazole-betamethasone (LOTRISONE) cream APPLY TO AFFECTED AREA TWICE A DAY 11/09/23    Everrett Coombe, DO  fluconazole (DIFLUCAN) 150 MG tablet Take 150 mg by mouth once. Patient not taking: Reported on 11/10/2023 11/09/23   [provider]  Multiple Vitamin (MULTIVITAMIN) tablet Take 1 tablet by mouth daily.    [provider]  nitrofurantoin, macrocrystal-monohydrate, (MACROBID) 100 MG capsule Take 1 capsule (100 mg total) by mouth 2 (two) times daily. Patient not taking: Reported on 11/10/2023 10/14/23   Merrilee Jansky, MD  omeprazole (PRILOSEC) 40 MG capsule TAKE 1 CAPSULE EVERY DAY Patient not taking: Reported on 11/10/2023 04/21/23   Everrett Coombe, DO  PARoxetine (PAXIL) 20 MG tablet TAKE 2 TABLETS EVERY DAY Patient not taking: Reported on 11/10/2023 08/09/23   Everrett Coombe, DO  polyethylene glycol (MIRALAX / GLYCOLAX) 17 g packet Take 17 g by mouth as needed.    [provider]  promethazine (PHENERGAN) 25 MG tablet Take 1 tablet (25 mg total) by mouth every 6 (six) hours as needed for nausea or vomiting. 10/01/23   Breeback, Jade L, PA-C  Rimegepant Sulfate (NURTEC) 75 MG TBDP Take 1 tablet (75 mg total) by mouth daily as needed (migraine). 09/23/23   Christen Butter, NP  SUMAtriptan (IMITREX) 25 MG tablet Take 1 tablet (25 mg total) by mouth every 2 (two) hours as needed for migraine. May repeat in 2 hours if headache persists or recurs. Patient not taking: Reported on 11/10/2023 10/05/23   Everrett Coombe, DO  topiramate (TOPAMAX) 50 MG tablet TAKE 0.5 TABLETS (  25 MG TOTAL) BY MOUTH AT BEDTIME FOR 7 DAYS, THEN 0.5 TABLETS (25 MG TOTAL) 2 (TWO) TIMES DAILY FOR 7 DAYS, THEN 1 TABLET (50 MG TOTAL) 2 (TWO) TIMES DAILY FOR 16 DAYS. Patient not taking: Reported on 11/10/2023 10/29/23 11/27/23  Everrett Coombe, DO  trimethoprim (TRIMPEX) 100 MG tablet TAKE 1 TABLET EVERY DAY 07/05/23   Everrett Coombe, DO  valACYclovir (VALTREX) 500 MG tablet TAKE 1 TABLET EVERY DAY Patient not taking: Reported on 11/10/2023 12/01/21   Everrett Coombe, DO      Allergies     Patient has no known allergies.    Review of Systems   Review of Systems  Physical Exam Updated Vital Signs BP 110/70 (BP Location: Right Arm)   Pulse 75   Temp 97.9 F (36.6 C) (Oral)   Resp 16   Ht 5\' 8"  (1.727 m)   Wt 67.1 kg   SpO2 97%   BMI 22.50 kg/m  Physical Exam Constitutional:      Comments: With clear mental status.  Well-nourished well-developed.  No respiratory distress.  HENT:     Mouth/Throat:     Pharynx: Oropharynx is clear.  Eyes:     Extraocular Movements: Extraocular movements intact.  Cardiovascular:     Rate and Rhythm: Normal rate and regular rhythm.  Pulmonary:     Effort: Pulmonary effort is normal.     Breath sounds: Normal breath sounds.  Abdominal:     Comments: Mild suprapubic discomfort to palpation.  No guarding.  Upper abdomen nontender.  Patient perceives as some percussion to the lower right back transmitted urethral discomfort.  Musculoskeletal:        General: No swelling or tenderness. Normal range of motion.     Right lower leg: No edema.     Left lower leg: No edema.  Skin:    General: Skin is warm and dry.  Neurological:     General: No focal deficit present.     Mental Status: She is oriented to person, place, and time.     Motor: No weakness.     Coordination: Coordination normal.  Psychiatric:        Mood and Affect: Mood normal.     ED Results / Procedures / Treatments   Labs (all labs ordered are listed, but only abnormal results are displayed) Labs Reviewed  CBC WITH DIFFERENTIAL/PLATELET - Abnormal; Notable for the following components:      Result Value   Hemoglobin 11.6 (*)    All other components within normal limits  URINALYSIS, ROUTINE W REFLEX MICROSCOPIC - Abnormal; Notable for the following components:   APPearance HAZY (*)    Hgb urine dipstick TRACE (*)    Ketones, ur 40 (*)    Leukocytes,Ua LARGE (*)    All other components within normal limits  URINALYSIS, MICROSCOPIC (REFLEX) - Abnormal;  Notable for the following components:   Bacteria, UA FEW (*)    All other components within normal limits  I-STAT CHEM 8, ED - Abnormal; Notable for the following components:   Potassium 3.4 (*)    BUN 3 (*)    Glucose, Bld 128 (*)    Calcium, Ion 1.12 (*)    All other components within normal limits  URINE CULTURE  CULTURE, BLOOD (ROUTINE X 2)  CULTURE, BLOOD (ROUTINE X 2)  LACTIC ACID, PLASMA  LACTIC ACID, PLASMA    EKG None  Radiology No results found.  Procedures Procedures    Medications Ordered in ED Medications  meropenem (MERREM) 1 g in sodium chloride 0.9 % 100 mL IVPB (0 g Intravenous Stopped 11/10/23 1825)  0.9 %  sodium chloride infusion ( Intravenous Paused 11/10/23 1821)  lactated ringers bolus 1,000 mL ( Intravenous Stopped 11/10/23 1941)    ED Course/ Medical Decision Making/ A&P                                 Medical Decision Making Amount and/or Complexity of Data Reviewed Labs: ordered.  Risk Prescription drug management. Decision regarding hospitalization.   Patient presents as outlined.  She has had waxing and waning cystitis symptoms for about the past 5 weeks.  She was for started on cefdinir 10\28.  At her follow-up appointment on the following day her PCP gave her a gram of Rocephin.  Patient reports that she was compliant with this not improving.  Culture then was positive for E. coli ESBL.  Today patient was referred to the emergency department with plan for IV fluids and antibiotics for worsening symptoms and concern for possible bacteremia or early sepsis.  Lactic acid 1.4.  Urinalysis 21-50 WBCs few bacteria large leuk esterase.  Positive for ketones.  White count 8.5 H&H 11.6 and 36 platelets normal.  UN 3 creatinine 0.9 potassium 3.4 sodium 142.  This time with prior UTI ESBL positive and worsening constitutional symptoms, will treat for ESBL with IV meropenem and obtain blood cultures.  Will plan for observation on IV fluids pending  blood cultures and patient response to therapy.  Consult: Reviewed with Dr. Dalene Carrow for admission [facility] hospitalist        Final Clinical Impression(s) / ED Diagnoses Final diagnoses:  Acute cystitis without hematuria  Malaise and fatigue  Dehydration  ESBL (extended spectrum beta-lactamase) producing bacteria infection    Rx / DC Orders ED Discharge Orders     None         Arby Barrette, MD 11/10/23 Corky Crafts    Arby Barrette, MD 11/10/23 2002

## 2023-11-10 NOTE — Progress Notes (Signed)
Cindy [name] - 72 y.o. female MRN [id number]  Date of birth: [birth year]  Subjective Chief Complaint  Patient presents with   Urinary Tract Infection   Altered Mental Status    HPI Cindy [name] is a 71 y.o. female here today with complaint of continued dysuria, urinary frequency and urgency.  She has also had more confusion.  Now having nausea and vomiting.  She feels weak.  She is not eating very much.  Nitrofurantoin was called in at the end of last month after culture returned showing ESBL, sensitive to nitrofurantoin, but she is not sure that she ever started this.  She denies flank pain but is having some pain throughout her body.   ROS:  A comprehensive ROS was completed and negative except as noted per HPI  No Known Allergies  Past Medical History:  Diagnosis Date   Arthritis    Cancer (HCC)    breast   Depression    GERD (gastroesophageal reflux disease)    Hyperlipidemia    Tendonitis     Past Surgical History:  Procedure Laterality Date   BREAST SURGERY     right   ELBOW SURGERY     right    Social History   Socioeconomic History   Marital status: Married    Spouse name: Roe Coombs   Number of children: 0   Years of education: 18   Highest education level: Master's degree (e.g., MA, MS, MEng, MEd, MSW, MBA)  Occupational History   Occupation: Retired.  Tobacco Use   Smoking status: Never   Smokeless tobacco: Never  Vaping Use   Vaping status: Never Used  Substance and Sexual Activity   Alcohol use: Not Currently    Comment: rarely   Drug use: No   Sexual activity: Yes    Birth control/protection: Post-menopausal  Other Topics Concern   Not on file  Social History Narrative   Lives with her husband. They help take care of her brother's grandchildren. She enjoys singing in the choir and gardening.   Social Determinants of Health   Financial Resource Strain: Low Risk  (12/28/2022)   Overall Financial Resource Strain (CARDIA)    Difficulty of Paying  Living Expenses: Not hard at all  Food Insecurity: No Food Insecurity (12/28/2022)   Hunger Vital Sign    Worried About Running Out of Food in the Last Year: Never true    Ran Out of Food in the Last Year: Never true  Transportation Needs: No Transportation Needs (12/28/2022)   PRAPARE - Administrator, Civil Service (Medical): No    Lack of Transportation (Non-Medical): No  Physical Activity: Inactive (12/28/2022)   Exercise Vital Sign    Days of Exercise per Week: 0 days    Minutes of Exercise per Session: 0 min  Stress: No Stress Concern Present (12/28/2022)   [employer] of Occupational Health - Occupational Stress Questionnaire    Feeling of Stress : Not at all  Social Connections: Socially Integrated (12/28/2022)   Social Connection and Isolation Panel [NHANES]    Frequency of Communication with Friends and Family: More than three times a week    Frequency of Social Gatherings with Friends and Family: More than three times a week    Attends Religious Services: More than 4 times per year    Active Member of [employer] or Organizations: Yes    Attends Engineer, structural: More than 4 times per year    Marital Status: Married  Family History  Problem Relation Age of Onset   Cancer Mother        bone   Hypertension Father    Diabetes Father    Stroke Father    Hypertension Sister    Alzheimer's disease Brother    Hypertension Sister    Colon cancer Neg Hx    Esophageal cancer Neg Hx    Rectal cancer Neg Hx    Stomach cancer Neg Hx     Health Maintenance  Topic Date Due   COVID-19 Vaccine (7 - 2023-24 season) 08/15/2023   Medicare Annual Wellness (AWV)  12/29/2023   MAMMOGRAM  07/20/2024   DTaP/Tdap/Td (4 - Td or Tdap) 04/05/2028   Colonoscopy  08/28/2029   Pneumonia Vaccine 61+ Years old  Completed   INFLUENZA VACCINE  Completed   DEXA SCAN  Completed   Hepatitis C Screening  Completed   Zoster Vaccines- Shingrix  Completed   HPV VACCINES   Aged Out     ----------------------------------------------------------------------------------------------------------------------------------------------------------------------------------------------------------------- Physical Exam BP 127/67 (BP Location: Left Arm, Patient Position: Sitting, Cuff Size: Normal)   Pulse 73   Temp (!) 97.4 F (36.3 C) (Oral)   Ht 5\' 8"  (1.727 m)   Wt 163 lb (73.9 kg)   SpO2 99%   BMI 24.78 kg/m   Physical Exam Constitutional:      Appearance: She is ill-appearing.  HENT:     Head: Normocephalic and atraumatic.  Cardiovascular:     Rate and Rhythm: Normal rate and regular rhythm.  Pulmonary:     Effort: Pulmonary effort is normal.     Breath sounds: Normal breath sounds.  Abdominal:     Tenderness: There is no right CVA tenderness or left CVA tenderness.  Neurological:     General: No focal deficit present.     ------------------------------------------------------------------------------------------------------------------------------------------------------------------------------------------------------------------- Assessment and Plan  Complicated UTI (urinary tract infection) Recent culture with ESBL.  Never picked up macrobid despite contacting x2 instructing to start this.  VSS however with her nausea and vomiting, dehydration from poor PO intake and untreated ESBL I think she needs evaluation in ED with consideration of IV fluids and IV antibiotics.  She will go to [facility] ED. Will have MA contact charge nurse for warm handoff of patients pending arrival.    No orders of the defined types were placed in this encounter.   No follow-ups on file.    This visit occurred during the SARS-CoV-2 public health emergency.  Safety protocols were in place, including screening questions prior to the visit, additional usage of staff PPE, and extensive cleaning of exam room while observing appropriate contact time as indicated  for disinfecting solutions.

## 2023-11-10 NOTE — ED Triage Notes (Addendum)
Pt was seen by PCP today and was dx with UTI sent here for IV infusion for the UTI.  Pt has been on Macrobid and states it was not strong enough.  Pt has been dealing with these symptoms on/off x 5 weeks

## 2023-11-11 ENCOUNTER — Inpatient Hospital Stay (HOSPITAL_COMMUNITY): Payer: Medicare Other

## 2023-11-11 DIAGNOSIS — N390 Urinary tract infection, site not specified: Secondary | ICD-10-CM | POA: Diagnosis not present

## 2023-11-11 DIAGNOSIS — B9629 Other Escherichia coli [E. coli] as the cause of diseases classified elsewhere: Secondary | ICD-10-CM | POA: Diagnosis not present

## 2023-11-11 DIAGNOSIS — Z1612 Extended spectrum beta lactamase (ESBL) resistance: Secondary | ICD-10-CM | POA: Diagnosis not present

## 2023-11-11 DIAGNOSIS — R110 Nausea: Secondary | ICD-10-CM | POA: Diagnosis not present

## 2023-11-11 LAB — BASIC METABOLIC PANEL
Anion gap: 10 (ref 5–15)
BUN: <5 mg/dL — ABNORMAL LOW (ref 8–23)
CO2: 25 mmol/L (ref 22–32)
Calcium: 8.7 mg/dL — ABNORMAL LOW (ref 8.9–10.3)
Chloride: 108 mmol/L (ref 98–111)
Creatinine, Ser: 0.89 mg/dL (ref 0.44–1.00)
GFR, Estimated: >60 mL/min (ref >60)
Glucose, Bld: 91 mg/dL (ref 70–99)
Potassium: 3.1 mmol/L — ABNORMAL LOW (ref 3.5–5.1)
Sodium: 143 mmol/L (ref 135–145)

## 2023-11-11 LAB — CBC
HCT: 32.5 % — ABNORMAL LOW (ref 36.0–46.0)
Hemoglobin: 10.4 g/dL — ABNORMAL LOW (ref 12.0–15.0)
MCH: 27.1 pg (ref 26.0–34.0)
MCHC: 32 g/dL (ref 30.0–36.0)
MCV: 84.6 fL (ref 80.0–100.0)
Platelets: 311 10*3/uL (ref 150–400)
RBC: 3.84 MIL/uL — ABNORMAL LOW (ref 3.87–5.11)
RDW: 15 % (ref 11.5–15.5)
WBC: 7.4 10*3/uL (ref 4.0–10.5)
nRBC: 0 % (ref 0.0–0.2)

## 2023-11-11 LAB — URINE CULTURE
Culture: <10000 — AB
Special Requests: NORMAL

## 2023-11-11 LAB — HIV ANTIBODY (ROUTINE TESTING W REFLEX): HIV Screen 4th Generation wRfx: NONREACTIVE

## 2023-11-11 LAB — MAGNESIUM: Magnesium: 2.3 mg/dL (ref 1.7–2.4)

## 2023-11-11 MED ORDER — POTASSIUM CHLORIDE CRYS ER 20 MEQ PO TBCR
40.0000 meq | EXTENDED_RELEASE_TABLET | ORAL | Status: AC
  Administered 2023-11-11 (×2): 40 meq via ORAL
  Filled 2023-11-11 (×2): qty 2

## 2023-11-11 MED ORDER — POTASSIUM CHLORIDE CRYS ER 20 MEQ PO TBCR: 40.0000 meq | EXTENDED_RELEASE_TABLET | Freq: Once | ORAL | Status: DC

## 2023-11-11 NOTE — Care Management CC44 (Signed)
Condition Code 44 Documentation Completed  Patient Details  Name: Cindy [name] MRN: [id number] Date of Birth: June 06, 1951   Condition Code 44 given:  Yes Patient signature on Condition Code 44 notice:  Yes Documentation of 2 MD's agreement:  Yes Code 44 added to claim:  Yes    Darleene Cleaver, LCSW 11/11/2023, 12:13 PM

## 2023-11-11 NOTE — TOC CM/SW Note (Signed)
Transition of Care [facility]) - Inpatient Brief Assessment   Patient Details  Name: Cindy [name] MRN: [id number] Date of Birth: 11/18/1951  Transition of Care [facility]) CM/SW Contact:    Darleene Cleaver, LCSW Phone Number: 11/11/2023, 12:58 PM   Clinical Narrative:  Spoke to patient, she does have insurance and a PCP.  Patient does not have any SDOH needs.  Transition of Care Asessment: Insurance and Status: Insurance coverage has been reviewed Patient has primary care physician: Yes Home environment has been reviewed: Yes lives with husband Prior level of function:: Radio broadcast assistant Home Services: No current home services Social Determinants of Health Reivew: SDOH reviewed no interventions necessary Readmission risk has been reviewed: Yes Transition of care needs: no transition of care needs at this time

## 2023-11-11 NOTE — Progress Notes (Signed)
Mobility Specialist - Progress Note   11/11/23 0907  Mobility  Activity Ambulated independently in hallway  Level of Assistance Independent  Assistive Device None  Distance Ambulated (ft) 300 ft  Activity Response Tolerated well  Mobility Referral Yes  $Mobility charge 1 Mobility  Mobility Specialist Start Time (ACUTE ONLY) [id number]  Mobility Specialist Stop Time (ACUTE ONLY) 0907  Mobility Specialist Time Calculation (min) (ACUTE ONLY) 9 min   Pt received in bed and agreeable to mobility. No complaints during session. Pt to bed after session with all needs met.    [facility]

## 2023-11-11 NOTE — Care Management Obs Status (Signed)
MEDICARE OBSERVATION STATUS NOTIFICATION   Patient Details  Name: Cindy [name] MRN: [id number] Date of Birth: [birth year]   Medicare Observation Status Notification Given:  Yes    Halford Chessman 11/11/2023, 12:13 PM

## 2023-11-12 ENCOUNTER — Telehealth: Payer: Self-pay

## 2023-11-12 NOTE — Discharge Summary (Signed)
Physician Discharge Summary   Patient: Cindy [name] MRN: [id number] DOB: [birth year]  Admit date:     11/10/2023  Discharge date: 11/11/2023  Discharge Physician: Lynden Oxford  PCP: Everrett Coombe, DO  Recommendations at discharge: Follow-up with PCP in 1 week. Follow-up with urology in 1 week.   Follow-up Information     Everrett Coombe, DO. Schedule an appointment as soon as possible for a visit in 2 week(s).   Specialty: Family Medicine Contact information: [address] [place] [address] [phone]         Alfredo Martinez, MD. Schedule an appointment as soon as possible for a visit in 2 week(s).   Specialty: Urology Contact information: [address] [place] [address] [phone]                Discharge Diagnoses: Principal Problem:   UTI due to extended-spectrum beta lactamase (ESBL) producing Escherichia coli Active Problems:   Depression with anxiety   Mild cognitive impairment with memory loss   Intractable headache   Nausea  Hospital Course: Cindy [name] is a 72 y.o. female with medical history significant for depression, anxiety, mild cognitive impairment, chronic headaches, and recurrent UTIs who presents with generalized weakness, nausea, and vomiting.   Intractable nausea and vomiting. Patient presented to PCPs office with complaints of nausea and vomiting. Reports multiple episodes of vomiting. Suspect possible gastroenteritis/food poisoning as etiology of her nausea and vomiting as she reports that normally when she has a " UTI " she does not have nausea or vomiting. No imaging done at the time of admission but an x-ray abdomen performed by me is really assuring without any acute abnormality. Patient is able to tolerate oral diet. does not have any further nausea or vomiting right now. Abdominal exam is unremarkable. Would recommend continue oral hydration.  Dysuria. ESBL colonization Patient has recurrent  dysuria. Currently denies having any symptoms right now. Was actually seen by PCP for the same recently. Was in the ER in October for the same.  Urine culture was performed which grew ESBL at 20,000 colonies.  Her antibiotics were changed to Macrobid. Patient reports that she has taken 2 courses of 5 days of Macrobid twice daily as prescribed. At present suspect that she actually has ESBL colonization and not a true UTI. She does have frequent dysuria symptoms and would recommend her to follow-up with urology which they have seen in March 2024. Patient is on trimethoprim which I would recommend to continue. Recommend oral hydration and hygiene. Recommended patient that in future if she has dysuria symptoms, for she should increase her hydration and if she continues to have persistent symptoms despite that then consider evaluation for a possible UTI as inappropriate use of the antibiotic would only lead to further assistance.  Anxiety. Patient appears significantly anxious. Unable to container chain of thoughts and appears to be repeating same sentences again and again during conversation. Patient is on Wellbutrin, Klonopin, Paxil at home. Would recommend to continue that.  And have close outpatient follow-up.  Migraine. Continue Topamax. Continue Imitrex for prevention. Monitor.  Hypokalemia. Replaced.  Normocytic anemia. Dilutional in nature per Outpatient follow-up with PCP recommended.  Consultants:  none  Procedures performed:  none  DISCHARGE MEDICATION: Allergies as of 11/11/2023   No Known Allergies      Medication List     STOP taking these medications    omeprazole 40 MG capsule Commonly known as: PRILOSEC       TAKE  these medications    augmented betamethasone dipropionate 0.05 % cream Commonly known as: DIPROLENE-AF APPLY SPARINGLY TO AFFECTED AREA TWICE A DAY   buPROPion 150 MG 24 hr tablet Commonly known as: WELLBUTRIN XL TAKE 3 TABLETS EVERY  DAY   CALCIUM 1200 PO Take 1 tablet by mouth daily.   clonazePAM 1 MG tablet Commonly known as: KLONOPIN Take 1 tablet (1 mg total) by mouth 2 (two) times daily as needed.   clotrimazole-betamethasone cream Commonly known as: LOTRISONE APPLY TO AFFECTED AREA TWICE A DAY   multivitamin tablet Take 1 tablet by mouth daily.   Nurtec 75 MG Tbdp Generic drug: Rimegepant Sulfate Take 1 tablet (75 mg total) by mouth daily as needed (migraine).   PARoxetine 20 MG tablet Commonly known as: PAXIL TAKE 2 TABLETS EVERY DAY   polyethylene glycol 17 g packet Commonly known as: MIRALAX / GLYCOLAX Take 17 g by mouth as needed.   promethazine 25 MG tablet Commonly known as: PHENERGAN Take 1 tablet (25 mg total) by mouth every 6 (six) hours as needed for nausea or vomiting.   SUMAtriptan 25 MG tablet Commonly known as: Imitrex Take 1 tablet (25 mg total) by mouth every 2 (two) hours as needed for migraine. May repeat in 2 hours if headache persists or recurs.   topiramate 50 MG tablet Commonly known as: TOPAMAX TAKE 0.5 TABLETS (25 MG TOTAL) BY MOUTH AT BEDTIME FOR 7 DAYS, THEN 0.5 TABLETS (25 MG TOTAL) 2 (TWO) TIMES DAILY FOR 7 DAYS, THEN 1 TABLET (50 MG TOTAL) 2 (TWO) TIMES DAILY FOR 16 DAYS. Start taking on: October 29, 2023   trimethoprim 100 MG tablet Commonly known as: TRIMPEX TAKE 1 TABLET EVERY DAY       Disposition: Home Diet recommendation: Regular diet  Discharge Exam: Vitals:   11/10/23 2117 11/11/23 0306 11/11/23 0550 11/11/23 0956  BP: (!) 150/72 (!) 118/58 136/65 126/69  Pulse: 74 68 69 74  Resp: 16 18 18 16   Temp: 98.3 F (36.8 C) 98.1 F (36.7 C) 98.1 F (36.7 C) 98.5 F (36.9 C)  TempSrc: Oral Oral Oral Oral  SpO2: 96% 96% 96% 100%  Weight:      Height:       General: Appear in mild distress; no visible Abnormal Neck Mass Or lumps, Conjunctiva normal Cardiovascular: S1 and S2 Present, no Murmur, Respiratory: good respiratory effort, Bilateral Air  entry present and CTA, no Crackles, no wheezes Abdomen: Bowel Sound present, Non tender  Extremities: no Pedal edema Neurology: alert and oriented to time, place, and person anxious. Filed Weights   11/10/23 1538  Weight: 67.1 kg   Condition at discharge: stable  The results of significant diagnostics from this hospitalization (including imaging, microbiology, ancillary and laboratory) are listed below for reference.   Imaging Studies: DG Abd Portable 1V  Result Date: 11/11/2023 CLINICAL DATA:  Nausea. EXAM: PORTABLE ABDOMEN - 1 VIEW COMPARISON:  CT, 03/26/2019. FINDINGS: Normal bowel gas pattern. Soft tissues are unremarkable. No evidence of renal or ureteral stones. Unremarkable skeletal structures. IMPRESSION: Negative. Electronically Signed   By: Amie Portland M.D.   On: 11/11/2023 14:27    Microbiology: Results for orders placed or performed during the hospital encounter of 11/10/23  Urine Culture     Status: Abnormal   Collection Time: 11/10/23  3:52 PM   Specimen: Urine, Clean Catch  Result Value Ref Range Status   Specimen Description   Final    URINE, CLEAN CATCH Performed at [facility]  [address], [place], [place] [address]    Special Requests   Final    Normal Performed at [facility], [address] [place], [place] [address]    Culture (A)  Final    <10,000 COLONIES/mL INSIGNIFICANT GROWTH Performed at [facility], [address] [place], [place] [address]    Report Status 11/11/2023 FINAL  Final  Culture, blood (routine x 2)     Status: None (Preliminary result)   Collection Time: 11/10/23  4:56 PM   Specimen: BLOOD RIGHT WRIST  Result Value Ref Range Status   Specimen Description   Final    BLOOD RIGHT WRIST Performed at [facility], [address], [place], [place] [address]    Special Requests   Final    BOTTLES DRAWN AEROBIC AND ANAEROBIC Blood Culture adequate volume Performed at [facility],  [address] [place], [place] [address]    Culture   Final    NO GROWTH < 12 HOURS Performed at [facility], [address] [place], [place] [address]    Report Status PENDING  Incomplete  Culture, blood (routine x 2)     Status: None (Preliminary result)   Collection Time: 11/10/23  5:01 PM   Specimen: BLOOD  Result Value Ref Range Status   Specimen Description   Final    BLOOD LEFT ANTECUBITAL Performed at [facility], [address] [place], [place] [address]    Special Requests   Final    BOTTLES DRAWN AEROBIC AND ANAEROBIC Blood Culture adequate volume Performed at [facility], [address] [place], [place] [address]    Culture   Final    NO GROWTH < 12 HOURS Performed at [facility], [address], [place], [place] [address]    Report Status PENDING  Incomplete   Labs: CBC: Recent Labs  Lab 11/10/23 1546 11/10/23 1551 11/11/23 0502  WBC 8.5  --  7.4  NEUTROABS 7.0  --   --   HGB 11.6* 12.6 10.4*  HCT 36.8 37.0 32.5*  MCV 83.4  --  84.6  PLT 400  --  311   Basic Metabolic Panel: Recent Labs  Lab 11/10/23 1551 11/11/23 0502  NA 142 143  K 3.4* 3.1*  CL 104 108  CO2  --  25  GLUCOSE 128* 91  BUN 3* <5*  CREATININE 0.90 0.89  CALCIUM  --  8.7*  MG  --  2.3   Liver Function Tests: No results for input(s): "AST", "ALT", "ALKPHOS", "BILITOT", "PROT", "ALBUMIN" in the last 168 hours. CBG: No results for input(s): "GLUCAP" in the last 168 hours.  Discharge time spent: greater than 30 minutes.  Author: Lynden Oxford, MD  [facility] 11/11/2023

## 2023-11-12 NOTE — Transitions of Care (Post Inpatient/ED Visit) (Signed)
   11/12/2023  Name: Cindy [name] MRN: [id number] DOB: 1951/09/02  Today's TOC FU Call Status: Today's TOC FU Call Status:: Unsuccessful Call (1st Attempt) Unsuccessful Call (1st Attempt) Date: 11/12/23  Attempted to reach the patient regarding the most recent Inpatient/ED visit.  Follow Up Plan: Additional outreach attempts will be made to reach the patient to complete the Transitions of Care (Post Inpatient/ED visit) call.   Abby Safiatou Islam, CMA  [facility] Team Direct Dial: [phone]

## 2023-11-13 LAB — URINE CULTURE

## 2023-11-15 LAB — CULTURE, BLOOD (ROUTINE X 2)
Culture: NO GROWTH
Culture: NO GROWTH
Special Requests: ADEQUATE
Special Requests: ADEQUATE

## 2023-11-17 DIAGNOSIS — R350 Frequency of micturition: Secondary | ICD-10-CM | POA: Diagnosis not present

## 2023-11-17 DIAGNOSIS — N3000 Acute cystitis without hematuria: Secondary | ICD-10-CM | POA: Diagnosis not present

## 2023-11-19 ENCOUNTER — Ambulatory Visit: Payer: Self-pay | Admitting: Family Medicine

## 2023-11-19 ENCOUNTER — Other Ambulatory Visit: Payer: Self-pay | Admitting: Family Medicine

## 2023-11-19 MED ORDER — ONDANSETRON 4 MG PO TBDP: 4.0000 mg | ORAL_TABLET | Freq: Three times a day (TID) | ORAL | 0 refills | Status: DC | PRN

## 2023-11-19 NOTE — Telephone Encounter (Signed)
Copied from CRM [id number]. Topic: Clinical - Medication Question >> Nov 19, 2023 10:49 AM Dimitri Ped wrote: Reason for CRM: patient recently got out of hospital around thanksgiving and didn't get any medication for nausea cause she wasn't feeling that way at the moment.  Patient is wanting to speak with nurse  Chief Complaint: Nausea Symptoms: Nausea without vomiting Frequency: started yesterday and had this in the hospital around Thanksgiving.  Pertinent Negatives: Patient denies fever and vomiting Disposition: [] ED /[x] Urgent Care (no appt availability in office) / [] Appointment(In office/virtual)/ []  [facility]/ [] Home Care/ [] Refused Recommended Disposition /[] [facility]/ []  Follow-up with PCP Additional Notes: patient called in with c/o nausea without vomiting.  States she had a UTI around Thanksgiving.  States this happened recently to her while in hospital with UTI.  States she is not taking any prescription medications d/t nausea.  States Dr. Ashley Royalty told her to let triage nurse knows that when she calls he wants to take that call.  Patient instructed to go to ED or UC if becomes worse.   Reason for Disposition  Nausea lasts > 1 week  Answer Assessment - Initial Assessment Questions 1. NAUSEA SEVERITY: "How bad is the nausea?" (e.g., mild, moderate, severe; dehydration, weight loss)   - MILD: loss of appetite without change in eating habits   - MODERATE: decreased oral intake without significant weight loss, dehydration, or malnutrition   - SEVERE: inadequate caloric or fluid intake, significant weight loss, symptoms of dehydration     moderate 2. ONSET: "When did the nausea begin?"     Started yesterday a 3. VOMITING: "Any vomiting?" If Yes, ask: "How many times today?"     no 4. RECURRENT SYMPTOM: "Have you had nausea before?" If Yes, ask: "When was the last time?" "What happened that time?"     Had nausea before, in hospital 5. CAUSE: "What do you think is  causing the nausea?"     unknown  Protocols used: Nausea-A-AH

## 2023-11-23 ENCOUNTER — Other Ambulatory Visit: Payer: Self-pay

## 2023-11-23 MED ORDER — ONDANSETRON 4 MG PO TBDP: 4.0000 mg | ORAL_TABLET | Freq: Three times a day (TID) | ORAL | 0 refills | Status: DC | PRN

## 2023-11-23 NOTE — Transitions of Care (Post Inpatient/ED Visit) (Signed)
   11/23/2023  Name: Cindy [name] MRN: [id number] DOB: 25-Jun-1951  Today's TOC FU Call Status: Today's TOC FU Call Status:: Unsuccessful Call (1st Attempt) Unsuccessful Call (1st Attempt) Date: 11/12/23  Attempted to reach the patient regarding the most recent Inpatient/ED visit.  Follow Up Plan: Additional outreach attempts will be made to reach the patient to complete the Transitions of Care (Post Inpatient/ED visit) call.   Abby Catarina Huntley, CMA  [facility] Team Direct Dial: [phone]

## 2023-11-23 NOTE — Telephone Encounter (Signed)
Done and patient informed.

## 2023-11-23 NOTE — Telephone Encounter (Signed)
Patient states that she has lost her ondansetron rx that she just picked up on 11/19/23.  She is requesting a rx for Qty#10 be resent to  her pharmacy as she will likely now be paying out of pocket. O.k. to resend as Rob Bunting #10?

## 2023-11-23 NOTE — Telephone Encounter (Signed)
Patient has lost rx for ondansetron - message sent to Dr. Ashley Royalty requesting to refill as Rob Bunting #10 instead of the #20 that was originally prescribed.

## 2023-12-03 ENCOUNTER — Telehealth: Payer: Self-pay

## 2023-12-03 NOTE — Telephone Encounter (Signed)
Copied from CRM [id number]. Topic: Clinical - Medical Advice >> Dec 03, 2023  7:59 AM Nada Libman H wrote: Reason for CRM: Patient has no congestion but she doesnt feel the need to eat and she feels week//8 weeks she was in the hospital [place] long// She wanted to be seen before 12/26 which was the 1st day available//She would like a nurse to give her a call back//Please call [phone]

## 2023-12-03 NOTE — Telephone Encounter (Signed)
Would you please assist patient in scheduling appt with Dr. Ashley Royalty? Requesting before  12/09/23.

## 2023-12-09 ENCOUNTER — Other Ambulatory Visit: Payer: Self-pay | Admitting: Family Medicine

## 2023-12-09 MED ORDER — FLUCONAZOLE 150 MG PO TABS: 150.0000 mg | ORAL_TABLET | Freq: Once | ORAL | 1 refills | Status: AC

## 2023-12-23 ENCOUNTER — Ambulatory Visit (INDEPENDENT_AMBULATORY_CARE_PROVIDER_SITE_OTHER): Payer: PPO | Admitting: Family Medicine

## 2023-12-23 ENCOUNTER — Encounter: Payer: Self-pay | Admitting: Family Medicine

## 2023-12-23 VITALS — BP 120/76 | HR 85 | Ht 68.0 in | Wt 149.0 lb

## 2023-12-23 DIAGNOSIS — F330 Major depressive disorder, recurrent, mild: Secondary | ICD-10-CM | POA: Diagnosis not present

## 2023-12-23 DIAGNOSIS — G3184 Mild cognitive impairment, so stated: Secondary | ICD-10-CM | POA: Diagnosis not present

## 2023-12-23 DIAGNOSIS — E7800 Pure hypercholesterolemia, unspecified: Secondary | ICD-10-CM | POA: Diagnosis not present

## 2023-12-23 DIAGNOSIS — G43019 Migraine without aura, intractable, without status migrainosus: Secondary | ICD-10-CM

## 2023-12-23 NOTE — Assessment & Plan Note (Signed)
 She is doing well with Paxil and bupropion at current strength.  Has clonazepam as needed for associated anxiety which I recommend she use sparingly.

## 2023-12-23 NOTE — Assessment & Plan Note (Signed)
 She is seeing neurology.  Some mild worsening recently.  She will plan to follow up with neurology.

## 2023-12-23 NOTE — Patient Instructions (Signed)
 Please follow up with Dr. Frances Furbish for memory changes.

## 2023-12-23 NOTE — Progress Notes (Signed)
 Cindy [name] - 73 y.o. female MRN [id number]  Date of birth: [birth year]  Subjective Chief Complaint  Patient presents with   Medical Management of Chronic Issues    HPI Cindy [name] is a 73 y.o. female here today for follow up visit.   She reports that she is doing pretty well.   Mood remains stable with paxil  and bupropion .  Occasional use of clonazepam  as needed.  She has had some cognitive changes and is followed by neurology for this.  Has noted some mild worsening recently.   Migraines are stable with topiramate  and nurtec prn.  No significant side effects with this.   Denies new urinary symptoms.  She has had recurrent UTI with recent cultures growing ESBL.  She is followed by urology and taking trimethoprim  daily.    ROS:  A comprehensive ROS was completed and negative except as noted per HPI  No Known Allergies  Past Medical History:  Diagnosis Date   Arthritis    Cancer (HCC)    breast   Depression    GERD (gastroesophageal reflux disease)    Hyperlipidemia    Tendonitis     Past Surgical History:  Procedure Laterality Date   BREAST SURGERY     right   ELBOW SURGERY     right    Social History   Socioeconomic History   Marital status: Married    Spouse name: Todd   Number of children: 0   Years of education: 18   Highest education level: Master's degree (e.g., MA, MS, MEng, MEd, MSW, MBA)  Occupational History   Occupation: Retired.  Tobacco Use   Smoking status: Never   Smokeless tobacco: Never  Vaping Use   Vaping status: Never Used  Substance and Sexual Activity   Alcohol use: Not Currently    Comment: rarely   Drug use: No   Sexual activity: Yes    Birth control/protection: Post-menopausal  Other Topics Concern   Not on file  Social History Narrative   Lives with her husband. They help take care of her brother's grandchildren. She enjoys singing in the choir and gardening.   Social Drivers of Corporate Investment Banker Strain:  Low Risk  (12/28/2022)   Overall Financial Resource Strain (CARDIA)    Difficulty of Paying Living Expenses: Not hard at all  Food Insecurity: No Food Insecurity (11/10/2023)   Hunger Vital Sign    Worried About Running Out of Food in the Last Year: Never true    Ran Out of Food in the Last Year: Never true  Transportation Needs: No Transportation Needs (11/10/2023)   PRAPARE - Administrator, Civil Service (Medical): No    Lack of Transportation (Non-Medical): No  Physical Activity: Inactive (12/28/2022)   Exercise Vital Sign    Days of Exercise per Week: 0 days    Minutes of Exercise per Session: 0 min  Stress: No Stress Concern Present (12/28/2022)   [employer] of Occupational Health - Occupational Stress Questionnaire    Feeling of Stress : Not at all  Social Connections: Socially Integrated (12/28/2022)   Social Connection and Isolation Panel [NHANES]    Frequency of Communication with Friends and Family: More than three times a week    Frequency of Social Gatherings with Friends and Family: More than three times a week    Attends Religious Services: More than 4 times per year    Active Member of [employer] or Organizations: Yes  Attends Banker Meetings: More than 4 times per year    Marital Status: Married    Family History  Problem Relation Age of Onset   Cancer Mother        bone   Hypertension Father    Diabetes Father    Stroke Father    Hypertension Sister    Alzheimer's disease Brother    Hypertension Sister    Colon cancer Neg Hx    Esophageal cancer Neg Hx    Rectal cancer Neg Hx    Stomach cancer Neg Hx     Health Maintenance  Topic Date Due   Medicare Annual Wellness (AWV)  12/29/2023   COVID-19 Vaccine (7 - 2024-25 season) 01/08/2024 (Originally 08/15/2023)   MAMMOGRAM  07/20/2024   DTaP/Tdap/Td (4 - Td or Tdap) 04/05/2028   Colonoscopy  08/28/2029   Pneumonia Vaccine 76+ Years old  Completed   INFLUENZA VACCINE  Completed    DEXA SCAN  Completed   Hepatitis C Screening  Completed   Zoster Vaccines- Shingrix  Completed   HPV VACCINES  Aged Out     ----------------------------------------------------------------------------------------------------------------------------------------------------------------------------------------------------------------- Physical Exam BP 120/76 (BP Location: Left Arm, Patient Position: Sitting, Cuff Size: Normal)   Pulse 85   Ht 5' 8 (1.727 m)   Wt 149 lb (67.6 kg)   SpO2 100%   BMI 22.66 kg/m   Physical Exam Constitutional:      Appearance: Normal appearance.  HENT:     Head: Normocephalic and atraumatic.  Cardiovascular:     Rate and Rhythm: Normal rate and regular rhythm.  Pulmonary:     Effort: Pulmonary effort is normal.     Breath sounds: Normal breath sounds.  Musculoskeletal:     Cervical back: Neck supple.  Neurological:     General: No focal deficit present.     Mental Status: She is alert.  Psychiatric:        Mood and Affect: Mood normal.        Behavior: Behavior normal.     ------------------------------------------------------------------------------------------------------------------------------------------------------------------------------------------------------------------- Assessment and Plan  MDD (major depressive disorder), recurrent episode (HCC) She is doing well with Paxil  and bupropion  at current strength.  Has clonazepam  as needed for associated anxiety which I recommend she use sparingly.  Mild cognitive impairment with memory loss She is seeing neurology.  Some mild worsening recently.  She will plan to follow up with neurology.   Intractable migraine without aura and without status migrainosus Well controlled with topiramate .  Nurtec as needed.    No orders of the defined types were placed in this encounter.   Return in about 6 months (around 06/21/2024).    This visit occurred during the SARS-CoV-2 public health  emergency.  Safety protocols were in place, including screening questions prior to the visit, additional usage of staff PPE, and extensive cleaning of exam room while observing appropriate contact time as indicated for disinfecting solutions.

## 2023-12-23 NOTE — Assessment & Plan Note (Signed)
 Well controlled with topiramate.  Nurtec as needed.

## 2023-12-24 ENCOUNTER — Other Ambulatory Visit: Payer: Self-pay | Admitting: Family Medicine

## 2023-12-24 DIAGNOSIS — F330 Major depressive disorder, recurrent, mild: Secondary | ICD-10-CM

## 2023-12-24 LAB — CBC WITH DIFFERENTIAL/PLATELET
Basophils Absolute: 0 10*3/uL (ref 0.0–0.2)
Basos: 0 %
EOS (ABSOLUTE): 0.1 10*3/uL (ref 0.0–0.4)
Eos: 1 %
Hematocrit: 34.4 % (ref 34.0–46.6)
Hemoglobin: 10.5 g/dL — ABNORMAL LOW (ref 11.1–15.9)
Immature Grans (Abs): 0 10*3/uL (ref 0.0–0.1)
Immature Granulocytes: 0 %
Lymphocytes Absolute: 1.8 10*3/uL (ref 0.7–3.1)
Lymphs: 31 %
MCH: 25.6 pg — ABNORMAL LOW (ref 26.6–33.0)
MCHC: 30.5 g/dL — ABNORMAL LOW (ref 31.5–35.7)
MCV: 84 fL (ref 79–97)
Monocytes Absolute: 0.6 10*3/uL (ref 0.1–0.9)
Monocytes: 10 %
Neutrophils Absolute: 3.4 10*3/uL (ref 1.4–7.0)
Neutrophils: 58 %
Platelets: 262 10*3/uL (ref 150–450)
RBC: 4.1 x10E6/uL (ref 3.77–5.28)
RDW: 15.7 % — ABNORMAL HIGH (ref 11.7–15.4)
WBC: 5.9 10*3/uL (ref 3.4–10.8)

## 2023-12-24 LAB — LIPID PANEL WITH LDL/HDL RATIO
Cholesterol, Total: 198 mg/dL (ref 100–199)
HDL: 61 mg/dL (ref >39)
LDL Chol Calc (NIH): 122 mg/dL — ABNORMAL HIGH (ref 0–99)
LDL/HDL Ratio: 2 {ratio} (ref 0.0–3.2)
Triglycerides: 82 mg/dL (ref 0–149)
VLDL Cholesterol Cal: 15 mg/dL (ref 5–40)

## 2023-12-24 LAB — CMP14+EGFR
ALT: 8 [IU]/L (ref 0–32)
AST: 15 [IU]/L (ref 0–40)
Albumin: 3.7 g/dL — ABNORMAL LOW (ref 3.9–4.9)
Alkaline Phosphatase: 115 [IU]/L (ref 44–121)
BUN/Creatinine Ratio: 9 — ABNORMAL LOW (ref 12–28)
BUN: 7 mg/dL — ABNORMAL LOW (ref 8–27)
Bilirubin Total: 0.4 mg/dL (ref 0.0–1.2)
CO2: 23 mmol/L (ref 20–29)
Calcium: 8.8 mg/dL (ref 8.7–10.3)
Chloride: 104 mmol/L (ref 96–106)
Creatinine, Ser: 0.74 mg/dL (ref 0.57–1.00)
Globulin, Total: 2.2 g/dL (ref 1.5–4.5)
Glucose: 94 mg/dL (ref 70–99)
Potassium: 4.1 mmol/L (ref 3.5–5.2)
Sodium: 141 mmol/L (ref 134–144)
Total Protein: 5.9 g/dL — ABNORMAL LOW (ref 6.0–8.5)
eGFR: 87 mL/min/{1.73_m2} (ref >59)

## 2023-12-24 LAB — TSH: TSH: 1.17 u[IU]/mL (ref 0.450–4.500)

## 2023-12-26 ENCOUNTER — Encounter: Payer: Self-pay | Admitting: Family Medicine

## 2023-12-27 ENCOUNTER — Emergency Department (HOSPITAL_COMMUNITY)
Admission: EM | Admit: 2023-12-27 | Discharge: 2023-12-27 | Disposition: A | Discharge status: Home / Self Care | Payer: PPO | Attending: Emergency Medicine | Admitting: Emergency Medicine

## 2023-12-27 ENCOUNTER — Other Ambulatory Visit: Payer: Self-pay

## 2023-12-27 ENCOUNTER — Ambulatory Visit: Payer: Self-pay | Admitting: Family Medicine

## 2023-12-27 ENCOUNTER — Encounter (HOSPITAL_COMMUNITY): Payer: Self-pay

## 2023-12-27 DIAGNOSIS — R1013 Epigastric pain: Secondary | ICD-10-CM | POA: Diagnosis not present

## 2023-12-27 DIAGNOSIS — Z20822 Contact with and (suspected) exposure to covid-19: Secondary | ICD-10-CM | POA: Diagnosis not present

## 2023-12-27 DIAGNOSIS — R110 Nausea: Secondary | ICD-10-CM | POA: Insufficient documentation

## 2023-12-27 DIAGNOSIS — R9431 Abnormal electrocardiogram [ECG] [EKG]: Secondary | ICD-10-CM | POA: Diagnosis not present

## 2023-12-27 LAB — URINALYSIS, ROUTINE W REFLEX MICROSCOPIC
Bacteria, UA: NONE SEEN
Bilirubin Urine: NEGATIVE
Glucose, UA: NEGATIVE mg/dL
Hgb urine dipstick: NEGATIVE
Ketones, ur: 20 mg/dL — AB
Nitrite: NEGATIVE
Protein, ur: 100 mg/dL — AB
Specific Gravity, Urine: 1.02 (ref 1.005–1.030)
pH: 5 (ref 5.0–8.0)

## 2023-12-27 LAB — CBC
HCT: 35.9 % — ABNORMAL LOW (ref 36.0–46.0)
Hemoglobin: 11.6 g/dL — ABNORMAL LOW (ref 12.0–15.0)
MCH: 27.3 pg (ref 26.0–34.0)
MCHC: 32.3 g/dL (ref 30.0–36.0)
MCV: 84.5 fL (ref 80.0–100.0)
Platelets: 282 10*3/uL (ref 150–400)
RBC: 4.25 MIL/uL (ref 3.87–5.11)
RDW: 16.8 % — ABNORMAL HIGH (ref 11.5–15.5)
WBC: 6 10*3/uL (ref 4.0–10.5)
nRBC: 0 % (ref 0.0–0.2)

## 2023-12-27 LAB — RESP PANEL BY RT-PCR (RSV, FLU A&B, COVID)  RVPGX2
Influenza A by PCR: NEGATIVE
Influenza B by PCR: NEGATIVE
Resp Syncytial Virus by PCR: NEGATIVE
SARS Coronavirus 2 by RT PCR: NEGATIVE

## 2023-12-27 LAB — COMPREHENSIVE METABOLIC PANEL
ALT: 11 U/L (ref 0–44)
AST: 15 U/L (ref 15–41)
Albumin: 3.7 g/dL (ref 3.5–5.0)
Alkaline Phosphatase: 91 U/L (ref 38–126)
Anion gap: 11 (ref 5–15)
BUN: 6 mg/dL — ABNORMAL LOW (ref 8–23)
CO2: 22 mmol/L (ref 22–32)
Calcium: 8.7 mg/dL — ABNORMAL LOW (ref 8.9–10.3)
Chloride: 102 mmol/L (ref 98–111)
Creatinine, Ser: 0.86 mg/dL (ref 0.44–1.00)
GFR, Estimated: >60 mL/min (ref >60)
Glucose, Bld: 94 mg/dL (ref 70–99)
Potassium: 3.7 mmol/L (ref 3.5–5.1)
Sodium: 135 mmol/L (ref 135–145)
Total Bilirubin: 0.9 mg/dL (ref 0.0–1.2)
Total Protein: 7 g/dL (ref 6.5–8.1)

## 2023-12-27 LAB — LIPASE, BLOOD: Lipase: 41 U/L (ref 11–51)

## 2023-12-27 MED ORDER — FAMOTIDINE 20 MG PO TABS: 20.0000 mg | ORAL_TABLET | Freq: Two times a day (BID) | ORAL | 0 refills | Status: DC

## 2023-12-27 MED ORDER — SUMATRIPTAN SUCCINATE 25 MG PO TABS: 25.0000 mg | ORAL_TABLET | ORAL | 2 refills | Status: AC | PRN

## 2023-12-27 MED ORDER — ONDANSETRON HCL 4 MG PO TABS: 4.0000 mg | ORAL_TABLET | Freq: Four times a day (QID) | ORAL | 0 refills | Status: DC

## 2023-12-27 MED ORDER — CLONAZEPAM 1 MG PO TABS: 1.0000 mg | ORAL_TABLET | Freq: Two times a day (BID) | ORAL | 1 refills | Status: DC | PRN

## 2023-12-27 MED ORDER — ALUM & MAG HYDROXIDE-SIMETH 200-200-20 MG/5ML PO SUSP
30.0000 mL | Freq: Once | ORAL | Status: AC
  Administered 2023-12-27: 30 mL via ORAL
  Filled 2023-12-27: qty 30

## 2023-12-27 MED ORDER — PAROXETINE HCL 20 MG PO TABS: 40.0000 mg | ORAL_TABLET | Freq: Every day | ORAL | 3 refills | Status: DC

## 2023-12-27 MED ORDER — BUPROPION HCL ER (XL) 150 MG PO TB24: ORAL_TABLET | ORAL | 3 refills | Status: DC

## 2023-12-27 MED ORDER — NURTEC 75 MG PO TBDP: 1.0000 | ORAL_TABLET | Freq: Every day | ORAL | 1 refills | Status: AC | PRN

## 2023-12-27 MED ORDER — ONDANSETRON 4 MG PO TBDP
4.0000 mg | ORAL_TABLET | Freq: Once | ORAL | Status: AC
  Administered 2023-12-27: 4 mg via ORAL
  Filled 2023-12-27: qty 1

## 2023-12-27 MED ORDER — FAMOTIDINE 20 MG PO TABS
20.0000 mg | ORAL_TABLET | Freq: Once | ORAL | Status: AC
  Administered 2023-12-27: 20 mg via ORAL
  Filled 2023-12-27: qty 1

## 2023-12-27 NOTE — ED Triage Notes (Signed)
 Patient reports nausea, loss of appetite, fatigue, weakness, and lower back pain x 1 week. Patient denies vomiting, diarrhea, and fevers.

## 2023-12-27 NOTE — Discharge Instructions (Signed)
 You were seen in the emergency department for your nausea.  Your workup showed no signs of severe dehydration or abnormalities with your liver's or kidneys.  You tested negative for COVID, flu and RSV you may have another viral infection causing your symptoms.  You can take Zofran  as needed for nausea and I have given you an antacid that you should take daily for at least the next 1 to 2 weeks.  You should you should start with a bland diet and slowly increase back to normal food.  You can follow-up with your primary doctor to have your symptoms rechecked.  You should return to the emergency department if you have repetitive vomiting despite the nausea medicine, develop severe abdominal pain or if you have any other new or concerning symptoms.

## 2023-12-27 NOTE — ED Provider Notes (Signed)
 [place] EMERGENCY DEPARTMENT AT [facility] Provider Note   CSN: [id number] Arrival date & time: 12/27/23  1028     History  Chief Complaint  Patient presents with   Nausea    Cindy [name] is a 73 y.o. female.  Patient is a 72 year old female with a past medical history of anxiety presenting to the emergency department with nausea.  The patient states that she has been nauseous since last Thursday.  She states that she is mostly only had fluids over the last 2 days.  She states that she has not vomited.  Denies any associated abdominal pain.  States that she had some low back pain yesterday that she attributes to lying in bed all day.  She denies any fevers, cough, congestion or sore throat.  She denies any associated diarrhea, dysuria or hematuria.  She states that they did go to the requirement last week but otherwise has no known sick contacts.  The history is provided by the patient and the spouse.       Home Medications Prior to Admission medications   Medication Sig Start Date End Date Taking? Authorizing Provider  famotidine  (PEPCID ) 20 MG tablet Take 1 tablet (20 mg total) by mouth 2 (two) times daily. 12/27/23  Yes Ellouise, Britt Theard K, DO  ondansetron  (ZOFRAN ) 4 MG tablet Take 1 tablet (4 mg total) by mouth every 6 (six) hours. 12/27/23  Yes Ellouise, Cloria Ciresi K, DO  augmented betamethasone  dipropionate (DIPROLENE -AF) 0.05 % cream APPLY SPARINGLY TO AFFECTED AREA TWICE A DAY 05/18/23   [provider]  buPROPion  (WELLBUTRIN  XL) 150 MG 24 hr tablet TAKE 3 TABLETS EVERY DAY 08/04/23   Alvia Bring, DO  Calcium Carbonate-Vit D-Min (CALCIUM 1200 PO) Take 1 tablet by mouth daily.    [provider]  clonazePAM  (KLONOPIN ) 1 MG tablet Take 1 tablet (1 mg total) by mouth 2 (two) times daily as needed. 08/04/23   Alvia Bring, DO  clotrimazole -betamethasone  (LOTRISONE ) cream APPLY TO AFFECTED AREA TWICE A DAY 11/09/23   Alvia Bring, DO  Multiple  Vitamin (MULTIVITAMIN) tablet Take 1 tablet by mouth daily.    [provider]  ondansetron  (ZOFRAN -ODT) 4 MG disintegrating tablet Take 1 tablet (4 mg total) by mouth every 8 (eight) hours as needed for nausea or vomiting. 11/23/23   Alvia Bring, DO  PARoxetine  (PAXIL ) 20 MG tablet TAKE 2 TABLETS EVERY DAY Patient not taking: Reported on 12/23/2023 08/09/23   Alvia Bring, DO  polyethylene glycol (MIRALAX  / GLYCOLAX ) 17 g packet Take 17 g by mouth as needed.    [provider]  promethazine  (PHENERGAN ) 25 MG tablet Take 1 tablet (25 mg total) by mouth every 6 (six) hours as needed for nausea or vomiting. 10/01/23   Breeback, Jade L, PA-C  Rimegepant Sulfate (NURTEC) 75 MG TBDP Take 1 tablet (75 mg total) by mouth daily as needed (migraine). 09/23/23   Willo Mini, NP  SUMAtriptan  (IMITREX ) 25 MG tablet Take 1 tablet (25 mg total) by mouth every 2 (two) hours as needed for migraine. May repeat in 2 hours if headache persists or recurs. 10/05/23   Alvia Bring, DO  topiramate  (TOPAMAX ) 50 MG tablet TAKE 0.5 TABLETS (25 MG TOTAL) BY MOUTH AT BEDTIME FOR 7 DAYS, THEN 0.5 TABLETS (25 MG TOTAL) 2 (TWO) TIMES DAILY FOR 7 DAYS, THEN 1 TABLET (50 MG TOTAL) 2 (TWO) TIMES DAILY FOR 16 DAYS. Patient not taking: Reported on 11/10/2023 10/29/23 11/27/23  Alvia Bring, DO  trimethoprim  (  TRIMPEX ) 100 MG tablet TAKE 1 TABLET EVERY DAY 07/05/23   Alvia Bring, DO      Allergies    Patient has no known allergies.    Review of Systems   Review of Systems  Physical Exam Updated Vital Signs BP (!) 103/55   Pulse 71   Temp 98.1 F (36.7 C) (Oral)   Resp 10   Ht 5' 8 (1.727 m)   Wt 67.6 kg   SpO2 97%   BMI 22.66 kg/m  Physical Exam Vitals and nursing note reviewed.  Constitutional:      General: She is not in acute distress.    Appearance: Normal appearance.  HENT:     Head: Normocephalic.     Nose: Nose normal.     Mouth/Throat:     Mouth: Mucous membranes are moist.      Pharynx: Oropharynx is clear.  Eyes:     Extraocular Movements: Extraocular movements intact.     Conjunctiva/sclera: Conjunctivae normal.  Cardiovascular:     Rate and Rhythm: Normal rate and regular rhythm.     Heart sounds: Normal heart sounds.  Pulmonary:     Effort: Pulmonary effort is normal.     Breath sounds: Normal breath sounds.  Abdominal:     General: Abdomen is flat.     Palpations: Abdomen is soft.     Tenderness: There is abdominal tenderness (minimal epigastric). There is no right CVA tenderness or left CVA tenderness.  Musculoskeletal:        General: Normal range of motion.     Cervical back: Normal range of motion.     Comments: No midline back tenderness No reproducible paraspinal muscle tenderness  Skin:    General: Skin is warm and dry.  Neurological:     General: No focal deficit present.     Mental Status: She is alert and oriented to person, place, and time.  Psychiatric:        Mood and Affect: Mood normal.        Behavior: Behavior normal.     ED Results / Procedures / Treatments   Labs (all labs ordered are listed, but only abnormal results are displayed) Labs Reviewed  COMPREHENSIVE METABOLIC PANEL - Abnormal; Notable for the following components:      Result Value   BUN 6 (*)    Calcium 8.7 (*)    All other components within normal limits  CBC - Abnormal; Notable for the following components:   Hemoglobin 11.6 (*)    HCT 35.9 (*)    RDW 16.8 (*)    All other components within normal limits  URINALYSIS, ROUTINE W REFLEX MICROSCOPIC - Abnormal; Notable for the following components:   Color, Urine AMBER (*)    APPearance CLOUDY (*)    Ketones, ur 20 (*)    Protein, ur 100 (*)    Leukocytes,Ua MODERATE (*)    All other components within normal limits  RESP PANEL BY RT-PCR (RSV, FLU A&B, COVID)  RVPGX2  LIPASE, BLOOD    EKG EKG Interpretation Date/Time:  Monday December 27 2023 12:51:24 EST Ventricular Rate:  68 PR Interval:  149 QRS  Duration:  148 QT Interval:  479 QTC Calculation: 510 R Axis:   -18  Text Interpretation: Sinus rhythm Atrial premature complexes in couplets Right bundle branch block Inferior infarct, old No previous ECGs available Confirmed by Ellouise Fine ([id number]) on 12/27/2023 1:12:37 PM  Radiology No results found.  Procedures Procedures    Medications  Ordered in ED Medications  ondansetron  (ZOFRAN -ODT) disintegrating tablet 4 mg (4 mg Oral Given 12/27/23 1105)  famotidine  (PEPCID ) tablet 20 mg (20 mg Oral Given 12/27/23 1321)  alum & mag hydroxide-simeth (MAALOX/MYLANTA) 200-200-20 MG/5ML suspension 30 mL (30 mLs Oral Given 12/27/23 1321)    ED Course/ Medical Decision Making/ A&P Clinical Course as of 12/27/23 1504  Mon Dec 27, 2023  1334 Moderate leuks but no bacteria in the urine. No UTI symptoms. [VK]  1502 Viral swab negative, the patient has been able to tolerate p.o. and reports improvement of her symptoms.  She is stable for discharge home with outpatient follow-up and was given strict return precautions. [VK]    Clinical Course User Index [VK] Kingsley, Oronde Hallenbeck K, DO                                 Medical Decision Making This patient presents to the ED with chief complaint(s) of nausea with pertinent past medical history of anxiety which further complicates the presenting complaint. The complaint involves an extensive differential diagnosis and also carries with it a high risk of complications and morbidity.    The differential diagnosis includes dehydration, electrolyte abnormality, gastritis, GERD, pancreatitis, hepatitis, acute intraabdominal infection less likely as no point abdominal tenderness, no urinary symptoms making UTI less likely, viral syndrome   Additional history obtained: Additional history obtained from spouse Records reviewed previous admission documents and Primary Care Documents  ED Course and Reassessment: On patient's arrival she is hemodynamically  stable in no acute distress.  Was initially evaluated in triage and had labs performed.  Patient's labs showed no signs of dehydration and LFTs and lipase are within normal range.  Urine is pending.  She will additionally have viral swab performed with school viral syndrome as cause of her symptoms.  As well as EKG to evaluate for any arrhythmia in the setting of her generalized nausea and weakness.  Patient received Zofran  in triage and reports that her nausea has significantly improved.  Will be given GI cocktail and have p.o. challenge.  Independent labs interpretation:  The following labs were independently interpreted: within normal range  Independent visualization of imaging: - N/A  Consultation: - Consulted or discussed management/test interpretation w/ external professional: N/A  Consideration for admission or further workup: Patient has no emergent conditions requiring admission or further work-up at this time and is stable for discharge home with primary care follow-up  Social Determinants of health: N/A    Amount and/or Complexity of Data Reviewed Labs: ordered.  Risk OTC drugs. Prescription drug management.          Final Clinical Impression(s) / ED Diagnoses Final diagnoses:  Nausea    Rx / DC Orders ED Discharge Orders          Ordered    ondansetron  (ZOFRAN ) 4 MG tablet  Every 6 hours        12/27/23 1503    famotidine  (PEPCID ) 20 MG tablet  2 times daily        12/27/23 1503              Kingsley, Sherissa Tenenbaum K, DO 12/27/23 1504

## 2023-12-27 NOTE — Telephone Encounter (Signed)
   Chief Complaint: extreme weakness Symptoms: no appetite, drinking very little, back hurts Frequency: constant  Disposition: [x] ED /[] Urgent Care (no appt availability in office) / [] Appointment(In office/virtual)/ []  [facility]/ [] Home Care/ [] Refused Recommended Disposition /[] [place] [facility]/ []  Follow-up with PCP Additional Notes: Pt complaining of extreme weakness that started 2 days and has gotten worse. Pt feels she needs to be see and just needs fluids. Pt stated she's only eaten an egg white in last 2 days because she's afraid of vomiting. Pt isn't drinking much due to fear of vomiting. Pt denies nausea, headache, fever. Pt states she is able to walk but goes very slowly and takes breaks. Per protocol, RN advised pt to go to ED to be evaluated. Pt verbalized understanding and husband is driving pt to ED.           Copied from CRM [id number]. Topic: Clinical - Red Word Triage >> Dec 27, 2023  8:50 AM Joesph PARAS wrote: Red Word that prompted transfer to Nurse Triage: Patient calling to state she is not well, has felt horrible for the past two days and needs to see somebody. States she has back pain, nausea, thinks is dehydrated, etc. Reason for Disposition  [1] Drinking very little AND [2] dehydration suspected (e.g., no urine > 12 hours, very dry mouth, very lightheaded)  Answer Assessment - Initial Assessment Questions 1. DESCRIPTION: Describe how you are feeling.     Just feels weak all over 2. SEVERITY: How bad is it?  Can you stand and walk?   - MILD (0-3): Feels weak or tired, but does not interfere with work, school or normal activities.   - MODERATE (4-7): Able to stand and walk; weakness interferes with work, school, or normal activities.   - SEVERE (8-10): Unable to stand or walk; unable to do usual activities.     Moderate  3. ONSET: When did these symptoms begin? (e.g., hours, days, weeks, months)     2 days ago- yesterday was worse 4.  CAUSE: What do you think is causing the weakness or fatigue? (e.g., not drinking enough fluids, medical problem, trouble sleeping)     Not sure 5. NEW MEDICINES:  Have you started on any new medicines recently? (e.g., opioid pain medicines, benzodiazepines, muscle relaxants, antidepressants, antihistamines, neuroleptics, beta blockers)     denies 6. OTHER SYMPTOMS: Do you have any other symptoms? (e.g., chest pain, fever, cough, SOB, vomiting, diarrhea, bleeding, other areas of pain)     denies  Protocols used: Weakness (Generalized) and Fatigue-A-AH

## 2023-12-27 NOTE — Telephone Encounter (Signed)
 Requesting rx rf of  trimethoprim  100mg - last written 07/05/2023 Sumatriptan25mg - last written 10/05/2023 Rimegepant 75mg - last written 09/23/2023 Bupropion150mg  - last written 08/04/2023 Clonazepam  1mg  - last written 08/212/2024  Paroxetine  20mg - last written 08/09/2023`  All originally sent to [facility]  Requested to local pharmacy  [facility]

## 2023-12-28 ENCOUNTER — Ambulatory Visit: Payer: Self-pay | Admitting: Family Medicine

## 2023-12-28 NOTE — Telephone Encounter (Signed)
 Copied from CRM [id number]. Topic: Clinical - Red Word Triage >> Dec 27, 2023  8:50 AM Joesph PARAS wrote: Red Word that prompted transfer to Nurse Triage: Patient calling to state she is not well, has felt horrible for the past two days and needs to see somebody. States she has back pain, nausea, thinks is dehydrated, etc.   Reason for Disposition  General information question, no triage required and triager able to answer question  Answer Assessment - Initial Assessment Questions 1. REASON FOR CALL or QUESTION: What is your reason for calling today? or How can I best help you? or What question do you have that I can help answer?     Patient reports she missed a call and was trying to call back to see what was needed. No notes showing any calls made to patient today. Patient reports she was seen in the ER yesterday for nausea but is feeling much better today, and was only calling to return the call.  Protocols used: Information Only Call - No Triage-A-AH

## 2023-12-28 NOTE — Telephone Encounter (Signed)
 Patient seen at [facility] long ER

## 2023-12-30 ENCOUNTER — Telehealth: Payer: Self-pay | Admitting: Gastroenterology

## 2023-12-30 NOTE — Telephone Encounter (Signed)
The pt states that she was told by the ED that she has gastritis and should follow up with GI. She is a previous Dr Russella Dar pt. I have made her an appt with Dr Doy Hutching for 01/26/24 at 930 am. She will have a bland diet for now and take PPI until the appt.

## 2023-12-30 NOTE — Telephone Encounter (Signed)
Patient called stated she was in the ER yesterday for nausea and loss of appetite, patient is requesting to speak with a nurse urgently.

## 2024-01-03 ENCOUNTER — Ambulatory Visit (INDEPENDENT_AMBULATORY_CARE_PROVIDER_SITE_OTHER): Payer: PPO | Admitting: Family Medicine

## 2024-01-03 ENCOUNTER — Encounter: Payer: Medicare Other | Admitting: Family Medicine

## 2024-01-03 ENCOUNTER — Encounter: Payer: Self-pay | Admitting: Family Medicine

## 2024-01-03 VITALS — BP 119/80 | HR 78 | Ht 68.0 in | Wt 143.0 lb

## 2024-01-03 DIAGNOSIS — R110 Nausea: Secondary | ICD-10-CM

## 2024-01-03 MED ORDER — ONDANSETRON 4 MG PO TBDP: 4.0000 mg | ORAL_TABLET | Freq: Three times a day (TID) | ORAL | 0 refills | Status: DC | PRN

## 2024-01-03 MED ORDER — PANTOPRAZOLE SODIUM 40 MG PO TBEC: 40.0000 mg | DELAYED_RELEASE_TABLET | Freq: Every day | ORAL | 3 refills | Status: DC

## 2024-01-03 NOTE — Assessment & Plan Note (Signed)
She is having continued nausea.  Adding Protonix and will see if we can get her in with GI sooner given her significant weight loss and poor appetite.  Zofran renewed.  Getting labs including lipase and repeat urinalysis and culture.

## 2024-01-03 NOTE — Patient Instructions (Addendum)
start Pantoprazole daily.  Zofran as needed for nausea We'll see if we can get a sooner appt. With GI clinic.

## 2024-01-03 NOTE — Progress Notes (Signed)
Cindy [name] - 73 y.o. female MRN [id number]  Date of birth: Jul 12, 1951  Subjective Chief Complaint  Patient presents with   unexplained weight loss    HPI ASHONTE WALTZER is a 73 y.o. female here today with concerns of poor appetite and weight loss.  She feels like her appetite has not been very good over the past few weeks.  She did have significant illness in November when she was hospitalized with UTI caused by ESBL.  She has not had new or worsening urinary symptoms recently.  Seen in the ED for nausea on 12/27/23.  Given GI cocktail and oral rehydration protocol.  Her urine did have leukocytes without nitrite.  This was not sent for culture.  She was diagnosed with gastritis in the ED.  Famotidine was added at that time.  She did contact her GI doctor was told it would be a few weeks before she could get in with them.  Her weight is down about 6 lbs since visit earlier this month and about 20 pounds since November.  ROS:  A comprehensive ROS was completed and negative except as noted per HPI  No Known Allergies  Past Medical History:  Diagnosis Date   Arthritis    Cancer (HCC)    breast   Depression    GERD (gastroesophageal reflux disease)    Hyperlipidemia    Tendonitis     Past Surgical History:  Procedure Laterality Date   BREAST SURGERY     right   ELBOW SURGERY     right    Social History   Socioeconomic History   Marital status: Married    Spouse name: Roe Coombs   Number of children: 0   Years of education: 18   Highest education level: Master's degree (e.g., MA, MS, MEng, MEd, MSW, MBA)  Occupational History   Occupation: Retired.  Tobacco Use   Smoking status: Never   Smokeless tobacco: Never  Vaping Use   Vaping status: Never Used  Substance and Sexual Activity   Alcohol use: Not Currently    Comment: rarely   Drug use: No   Sexual activity: Yes    Birth control/protection: Post-menopausal  Other Topics Concern   Not on file  Social History  Narrative   Lives with her husband. They help take care of her brother's grandchildren. She enjoys singing in the choir and gardening.   Social Drivers of Corporate investment banker Strain: Low Risk  (12/28/2022)   Overall Financial Resource Strain (CARDIA)    Difficulty of Paying Living Expenses: Not hard at all  Food Insecurity: No Food Insecurity (11/10/2023)   Hunger Vital Sign    Worried About Running Out of Food in the Last Year: Never true    Ran Out of Food in the Last Year: Never true  Transportation Needs: No Transportation Needs (11/10/2023)   PRAPARE - Administrator, Civil Service (Medical): No    Lack of Transportation (Non-Medical): No  Physical Activity: Inactive (12/28/2022)   Exercise Vital Sign    Days of Exercise per Week: 0 days    Minutes of Exercise per Session: 0 min  Stress: No Stress Concern Present (12/28/2022)   [employer] of Occupational Health - Occupational Stress Questionnaire    Feeling of Stress : Not at all  Social Connections: Socially Integrated (12/28/2022)   Social Connection and Isolation Panel [NHANES]    Frequency of Communication with Friends and Family: More than three times a week  Frequency of Social Gatherings with Friends and Family: More than three times a week    Attends Religious Services: More than 4 times per year    Active Member of Clubs or Organizations: Yes    Attends Engineer, structural: More than 4 times per year    Marital Status: Married    Family History  Problem Relation Age of Onset   Cancer Mother        bone   Hypertension Father    Diabetes Father    Stroke Father    Hypertension Sister    Alzheimer's disease Brother    Hypertension Sister    Colon cancer Neg Hx    Esophageal cancer Neg Hx    Rectal cancer Neg Hx    Stomach cancer Neg Hx     Health Maintenance  Topic Date Due   Medicare Annual Wellness (AWV)  12/29/2023   COVID-19 Vaccine (7 - 2024-25 season) 01/08/2024  (Originally 08/15/2023)   MAMMOGRAM  07/20/2024   DTaP/Tdap/Td (4 - Td or Tdap) 04/05/2028   Colonoscopy  08/28/2029   Pneumonia Vaccine 39+ Years old  Completed   INFLUENZA VACCINE  Completed   DEXA SCAN  Completed   Hepatitis C Screening  Completed   Zoster Vaccines- Shingrix  Completed   HPV VACCINES  Aged Out     ----------------------------------------------------------------------------------------------------------------------------------------------------------------------------------------------------------------- Physical Exam BP 119/80   Pulse 78   Ht 5\' 8"  (1.727 m)   Wt 143 lb (64.9 kg)   SpO2 100%   BMI 21.74 kg/m   Physical Exam Constitutional:      Appearance: Normal appearance.  Eyes:     General: No scleral icterus. Cardiovascular:     Rate and Rhythm: Normal rate and regular rhythm.  Pulmonary:     Effort: Pulmonary effort is normal.     Breath sounds: Normal breath sounds.  Abdominal:     General: Bowel sounds are normal. There is no distension.     Tenderness: There is no abdominal tenderness.  Neurological:     Mental Status: She is alert.  Psychiatric:        Mood and Affect: Mood normal.        Behavior: Behavior normal.     ------------------------------------------------------------------------------------------------------------------------------------------------------------------------------------------------------------------- Assessment and Plan  Nausea She is having continued nausea.  Adding Protonix and will see if we can get her in with GI sooner given her significant weight loss and poor appetite.  Zofran renewed.  Getting labs including lipase and repeat urinalysis and culture.   Meds ordered this encounter  Medications   ondansetron (ZOFRAN-ODT) 4 MG disintegrating tablet    Sig: Take 1 tablet (4 mg total) by mouth every 8 (eight) hours as needed for nausea or vomiting.    Dispense:  30 tablet    Refill:  0   pantoprazole  (PROTONIX) 40 MG tablet    Sig: Take 1 tablet (40 mg total) by mouth daily.    Dispense:  30 tablet    Refill:  3    No follow-ups on file.    This visit occurred during the SARS-CoV-2 public health emergency.  Safety protocols were in place, including screening questions prior to the visit, additional usage of staff PPE, and extensive cleaning of exam room while observing appropriate contact time as indicated for disinfecting solutions.

## 2024-01-04 LAB — CBC WITH DIFFERENTIAL/PLATELET
Basophils Absolute: 0 10*3/uL (ref 0.0–0.2)
Basos: 0 %
EOS (ABSOLUTE): 0.1 10*3/uL (ref 0.0–0.4)
Eos: 1 %
Hematocrit: 35.1 % (ref 34.0–46.6)
Hemoglobin: 11.1 g/dL (ref 11.1–15.9)
Immature Grans (Abs): 0 10*3/uL (ref 0.0–0.1)
Immature Granulocytes: 0 %
Lymphocytes Absolute: 1.7 10*3/uL (ref 0.7–3.1)
Lymphs: 29 %
MCH: 26.2 pg — ABNORMAL LOW (ref 26.6–33.0)
MCHC: 31.6 g/dL (ref 31.5–35.7)
MCV: 83 fL (ref 79–97)
Monocytes Absolute: 0.7 10*3/uL (ref 0.1–0.9)
Monocytes: 12 %
Neutrophils Absolute: 3.5 10*3/uL (ref 1.4–7.0)
Neutrophils: 58 %
Platelets: 284 10*3/uL (ref 150–450)
RBC: 4.24 x10E6/uL (ref 3.77–5.28)
RDW: 15.4 % (ref 11.7–15.4)
WBC: 6.1 10*3/uL (ref 3.4–10.8)

## 2024-01-04 LAB — URINALYSIS, ROUTINE W REFLEX MICROSCOPIC
Bilirubin, UA: NEGATIVE
Glucose, UA: NEGATIVE
Ketones, UA: NEGATIVE
Nitrite, UA: NEGATIVE
Protein,UA: NEGATIVE
RBC, UA: NEGATIVE
Specific Gravity, UA: 1.009 (ref 1.005–1.030)
Urobilinogen, Ur: 0.2 mg/dL (ref 0.2–1.0)
pH, UA: 6.5 (ref 5.0–7.5)

## 2024-01-04 LAB — CMP14+EGFR
ALT: 12 [IU]/L (ref 0–32)
AST: 15 [IU]/L (ref 0–40)
Albumin: 3.9 g/dL (ref 3.9–4.9)
Alkaline Phosphatase: 118 [IU]/L (ref 44–121)
BUN/Creatinine Ratio: 6 — ABNORMAL LOW (ref 12–28)
BUN: 5 mg/dL — ABNORMAL LOW (ref 8–27)
Bilirubin Total: 0.3 mg/dL (ref 0.0–1.2)
CO2: 24 mmol/L (ref 20–29)
Calcium: 9.3 mg/dL (ref 8.7–10.3)
Chloride: 104 mmol/L (ref 96–106)
Creatinine, Ser: 0.78 mg/dL (ref 0.57–1.00)
Globulin, Total: 2.5 g/dL (ref 1.5–4.5)
Glucose: 76 mg/dL (ref 70–99)
Potassium: 4.1 mmol/L (ref 3.5–5.2)
Sodium: 142 mmol/L (ref 134–144)
Total Protein: 6.4 g/dL (ref 6.0–8.5)
eGFR: 82 mL/min/{1.73_m2} (ref >59)

## 2024-01-04 LAB — MICROSCOPIC EXAMINATION
Casts: NONE SEEN /[LPF]
RBC, Urine: NONE SEEN /[HPF] (ref 0–2)

## 2024-01-04 LAB — LIPASE: Lipase: 27 U/L (ref 14–72)

## 2024-01-05 ENCOUNTER — Telehealth: Payer: Self-pay | Admitting: Family Medicine

## 2024-01-05 LAB — URINE CULTURE

## 2024-01-05 NOTE — Telephone Encounter (Signed)
Patient advised of upcoming appointment with [facility].   She wanted to thank Dr Ashley Royalty for taking care of this appointment.

## 2024-01-05 NOTE — Telephone Encounter (Signed)
Copied from CRM [id number]. Topic: Clinical - Medical Advice >> Jan 05, 2024 12:00 PM Nila Nephew wrote: Reason for CRM: Patient returning call for Marylene Land, patient is agreeable and understands the message. Patient has the appointment information written.

## 2024-01-05 NOTE — Progress Notes (Signed)
Patient scheduled for January 30 th at 1:20.

## 2024-01-05 NOTE — Telephone Encounter (Signed)
Copied from CRM [id number]. Topic: Referral - Question >> Jan 05, 2024 12:54 PM Shelah Lewandowsky wrote: Reason for CRM: received message about appointment with gastroenterology

## 2024-01-06 NOTE — Progress Notes (Signed)
Contacted [facility] and [facility] on 01/03/2024. [facility] stated their most soon appt was 01/26/24. Pt on wait list for date change. [facility] unwilling to take a patient that was scheduled at another GI office.

## 2024-01-07 ENCOUNTER — Encounter: Payer: Self-pay | Admitting: Family Medicine

## 2024-01-07 DIAGNOSIS — H02834 Dermatochalasis of left upper eyelid: Secondary | ICD-10-CM | POA: Diagnosis not present

## 2024-01-07 DIAGNOSIS — H524 Presbyopia: Secondary | ICD-10-CM | POA: Diagnosis not present

## 2024-01-07 DIAGNOSIS — H52203 Unspecified astigmatism, bilateral: Secondary | ICD-10-CM | POA: Diagnosis not present

## 2024-01-07 DIAGNOSIS — H5203 Hypermetropia, bilateral: Secondary | ICD-10-CM | POA: Diagnosis not present

## 2024-01-07 DIAGNOSIS — H43813 Vitreous degeneration, bilateral: Secondary | ICD-10-CM | POA: Diagnosis not present

## 2024-01-07 DIAGNOSIS — H02831 Dermatochalasis of right upper eyelid: Secondary | ICD-10-CM | POA: Diagnosis not present

## 2024-01-07 DIAGNOSIS — H11153 Pinguecula, bilateral: Secondary | ICD-10-CM | POA: Diagnosis not present

## 2024-01-07 DIAGNOSIS — H2513 Age-related nuclear cataract, bilateral: Secondary | ICD-10-CM | POA: Diagnosis not present

## 2024-01-09 ENCOUNTER — Other Ambulatory Visit: Payer: Self-pay | Admitting: Family Medicine

## 2024-01-09 DIAGNOSIS — G43019 Migraine without aura, intractable, without status migrainosus: Secondary | ICD-10-CM

## 2024-01-11 ENCOUNTER — Ambulatory Visit: Payer: Self-pay | Admitting: Family Medicine

## 2024-01-11 NOTE — Telephone Encounter (Signed)
  Chief Complaint: Nosebleeds Symptoms: nosebleeds Frequency: Ongoing x 2 days Pertinent Negatives: Patient denies fever, congestion Disposition: [] ED /[] Urgent Care (no appt availability in office) / [x] Appointment(In office/virtual)/ []  [facility]/ [] Home Care/ [] Refused Recommended Disposition /[]  Mobile Bus/ []  Follow-up with PCP Additional Notes: Pt reports nosebleeds since yesterday intermittently, she describes them as significant but not pouring. Denies fever, dizziness, congestion, N/V. OV scheduled for tomorrow AM. This RN educated pt on home care, new-worsening symptoms, when to call back/seek emergent care. Pt verbalized understanding and agrees to plan.    Copied from CRM [id number]. Topic: Clinical - Red Word Triage >> Jan 11, 2024 12:09 PM Cindy [name] wrote: Red Word that prompted transfer to Nurse Triage: Nose bleeds starting yesterday - both sides Reason for Disposition  [1] Bleeding recurs 3 or more times in 24 hours AND [2] direct pressure applied correctly  Answer Assessment - Initial Assessment Questions 1. AMOUNT OF BLEEDING: "How bad is the bleeding?" "How much blood was lost?" "Has the bleeding stopped?"   - MILD: needed a couple tissues   - MODERATE: needed many tissues   - SEVERE: large blood clots, soaked many tissues, lasted more than 30 minutes      Significant but not pouring 2. ONSET: "When did the nosebleed start?"      Intermittent since yesterday 5. CAUSE: "What do you think caused this nosebleed?"     Possibly dry air 6. LOCAL FACTORS: "Do you have any cold symptoms?", "Have you been rubbing or picking at your nose?"     None  8. BLOOD THINNERS: "Do you take any blood thinners?" (e.g., aspirin, clopidogrel / Plavix, coumadin, heparin). Notes: Other strong blood thinners include: Arixtra (fondaparinux), Eliquis (apixaban), Pradaxa (dabigatran), and Xarelto (rivaroxaban).     None 9. OTHER SYMPTOMS: "Do you have any other symptoms?"  (e.g., lightheadedness)     None  Protocols used: Nosebleed-A-AH

## 2024-01-11 NOTE — Telephone Encounter (Signed)
Patient shows scheduled for 01/12/24

## 2024-01-12 ENCOUNTER — Ambulatory Visit (INDEPENDENT_AMBULATORY_CARE_PROVIDER_SITE_OTHER): Payer: PPO | Admitting: Family Medicine

## 2024-01-12 ENCOUNTER — Encounter: Payer: Self-pay | Admitting: Family Medicine

## 2024-01-12 VITALS — BP 118/78 | HR 94 | Temp 98.1°F | Ht 68.0 in | Wt 143.3 lb

## 2024-01-12 DIAGNOSIS — R040 Epistaxis: Secondary | ICD-10-CM | POA: Diagnosis not present

## 2024-01-12 DIAGNOSIS — R6889 Other general symptoms and signs: Secondary | ICD-10-CM | POA: Diagnosis not present

## 2024-01-12 DIAGNOSIS — R110 Nausea: Secondary | ICD-10-CM

## 2024-01-12 LAB — POC COVID19 BINAXNOW: SARS Coronavirus 2 Ag: NEGATIVE

## 2024-01-12 LAB — POCT INFLUENZA A/B
Influenza A, POC: NEGATIVE
Influenza B, POC: NEGATIVE

## 2024-01-12 MED ORDER — MUPIROCIN 2 % EX OINT: TOPICAL_OINTMENT | CUTANEOUS | 0 refills | Status: DC

## 2024-01-12 NOTE — Assessment & Plan Note (Signed)
Continued nausea with decreased appetite.  She does have appointment with GI tomorrow.

## 2024-01-12 NOTE — Progress Notes (Signed)
Cindy [name] - 74 y.o. female MRN [id number]  Date of birth: 01/08/1951  Subjective Chief Complaint  Patient presents with   Flu sx    HPI Cindy [name] is a 73 y.o. female here today for follow-up visit.  She continues to have generalized fatigue and decreased appetite.  She does have GI appointment tomorrow to discuss her decreased appetite early satiety and weight loss.  She has been trying to increase her intake some.  She has had some generalized bodyaches.  Testing for flu and COVID-negative.  She did have nosebleed yesterday this is fairly mild and controlled with pressure.  They have started using a humidifier.  ROS:  A comprehensive ROS was completed and negative except as noted per HPI  No Known Allergies  Past Medical History:  Diagnosis Date   Arthritis    Cancer (HCC)    breast   Depression    GERD (gastroesophageal reflux disease)    Hyperlipidemia    Tendonitis     Past Surgical History:  Procedure Laterality Date   BREAST SURGERY     right   ELBOW SURGERY     right    Social History   Socioeconomic History   Marital status: Married    Spouse name: Roe Coombs   Number of children: 0   Years of education: 18   Highest education level: Master's degree (e.g., MA, MS, MEng, MEd, MSW, MBA)  Occupational History   Occupation: Retired.  Tobacco Use   Smoking status: Never   Smokeless tobacco: Never  Vaping Use   Vaping status: Never Used  Substance and Sexual Activity   Alcohol use: Not Currently    Comment: rarely   Drug use: No   Sexual activity: Yes    Birth control/protection: Post-menopausal  Other Topics Concern   Not on file  Social History Narrative   Lives with her husband. They help take care of her brother's grandchildren. She enjoys singing in the choir and gardening.   Social Drivers of Corporate investment banker Strain: Low Risk  (12/28/2022)   Overall Financial Resource Strain (CARDIA)    Difficulty of Paying Living Expenses: Not  hard at all  Food Insecurity: No Food Insecurity (11/10/2023)   Hunger Vital Sign    Worried About Running Out of Food in the Last Year: Never true    Ran Out of Food in the Last Year: Never true  Transportation Needs: No Transportation Needs (11/10/2023)   PRAPARE - Administrator, Civil Service (Medical): No    Lack of Transportation (Non-Medical): No  Physical Activity: Inactive (12/28/2022)   Exercise Vital Sign    Days of Exercise per Week: 0 days    Minutes of Exercise per Session: 0 min  Stress: No Stress Concern Present (12/28/2022)   [employer] of Occupational Health - Occupational Stress Questionnaire    Feeling of Stress : Not at all  Social Connections: Socially Integrated (12/28/2022)   Social Connection and Isolation Panel [NHANES]    Frequency of Communication with Friends and Family: More than three times a week    Frequency of Social Gatherings with Friends and Family: More than three times a week    Attends Religious Services: More than 4 times per year    Active Member of [employer] or Organizations: Yes    Attends Engineer, structural: More than 4 times per year    Marital Status: Married    Family History  Problem Relation Age  of Onset   Cancer Mother        bone   Hypertension Father    Diabetes Father    Stroke Father    Hypertension Sister    Alzheimer's disease Brother    Hypertension Sister    Colon cancer Neg Hx    Esophageal cancer Neg Hx    Rectal cancer Neg Hx    Stomach cancer Neg Hx     Health Maintenance  Topic Date Due   Medicare Annual Wellness (AWV)  12/29/2023   COVID-19 Vaccine (7 - 2024-25 season) 01/27/2025 (Originally 08/15/2023)   MAMMOGRAM  07/20/2024   DTaP/Tdap/Td (4 - Td or Tdap) 04/05/2028   Colonoscopy  08/28/2029   Pneumonia Vaccine 60+ Years old  Completed   INFLUENZA VACCINE  Completed   DEXA SCAN  Completed   Hepatitis C Screening  Completed   Zoster Vaccines- Shingrix  Completed   HPV VACCINES   Aged Out     ----------------------------------------------------------------------------------------------------------------------------------------------------------------------------------------------------------------- Physical Exam BP 118/78 (BP Location: Right Arm, Patient Position: Sitting, Cuff Size: Normal)   Pulse 94   Temp 98.1 F (36.7 C) (Oral)   Ht 5\' 8"  (1.727 m)   Wt 143 lb 4.8 oz (65 kg)   SpO2 99%   BMI 21.79 kg/m   Physical Exam Constitutional:      Appearance: Normal appearance.  HENT:     Head: Normocephalic and atraumatic.     Nose:     Comments: Turbinates erythematous Eyes:     General: No scleral icterus. Cardiovascular:     Rate and Rhythm: Normal rate and regular rhythm.  Pulmonary:     Effort: Pulmonary effort is normal.     Breath sounds: Normal breath sounds.  Neurological:     Mental Status: She is alert.  Psychiatric:        Mood and Affect: Mood normal.        Behavior: Behavior normal.     ------------------------------------------------------------------------------------------------------------------------------------------------------------------------------------------------------------------- Assessment and Plan  Epistaxis No active bleeding at this time.  Recommend continued use of home purifier, nasal saline and will add topical mupirocin.  Nausea Continued nausea with decreased appetite.  She does have appointment with GI tomorrow.   Meds ordered this encounter  Medications   mupirocin ointment (BACTROBAN) 2 %    Sig: Apply inside of nares twice daily x7 days.    Dispense:  22 g    Refill:  0    Return in about 2 weeks (around 01/26/2024) for Appetite.    This visit occurred during the SARS-CoV-2 public health emergency.  Safety protocols were in place, including screening questions prior to the visit, additional usage of staff PPE, and extensive cleaning of exam room while observing appropriate contact time as  indicated for disinfecting solutions.

## 2024-01-12 NOTE — Patient Instructions (Signed)
Start mupirocin ointment twice daily x1 week.

## 2024-01-12 NOTE — Assessment & Plan Note (Signed)
No active bleeding at this time.  Recommend continued use of home purifier, nasal saline and will add topical mupirocin.

## 2024-01-13 ENCOUNTER — Ambulatory Visit (INDEPENDENT_AMBULATORY_CARE_PROVIDER_SITE_OTHER): Payer: PPO | Admitting: Physician Assistant

## 2024-01-13 ENCOUNTER — Encounter: Payer: Self-pay | Admitting: Physician Assistant

## 2024-01-13 VITALS — BP 118/70 | HR 89 | Ht 68.0 in | Wt 143.0 lb

## 2024-01-13 DIAGNOSIS — R110 Nausea: Secondary | ICD-10-CM | POA: Diagnosis not present

## 2024-01-13 DIAGNOSIS — R634 Abnormal weight loss: Secondary | ICD-10-CM | POA: Diagnosis not present

## 2024-01-13 NOTE — Progress Notes (Addendum)
Chief Complaint: Nausea and weight loss  HPI:    Cindy [name] is a 73 year old female with a past medical history as listed below including depression, GERD and breast cancer, previously known to Dr. Russella Dar, who was referred to me by Everrett Coombe, DO for a complaint of nausea and weight loss.      08/29/2019 colonoscopy with an 8 mm polyp in the transverse colon and 4 mm polyp in the rectum as well as anal papula.  Repeat recommended 10 years.    11/26/2021 office visit with Dr. Russella Dar for diarrhea, abdominal pain and thinner stools.  That point that acute infectious diarrhea food poisoning.  She is continued on Meprazole 40 mg daily.    12/27/2023 ER visit for nausea.  Patient describing nausea since 12/23/2023.  Had mostly only been on fluids over the past 2 days, no vomiting.  No abdominal pain or diarrhea.  At the time found to have minimal epigastric discomfort on exam.  CMP with a BUN of 6 and a calcium 8.7 otherwise normal, CBC with hemoglobin 11.6 and otherwise normal.  Urinalysis with moderate leukocytes.  Respiratory panel and lipase normal.  EKG with sinus rhythm and atrial premature complexes.  Right bundle blanch block.  Patient given Zofran, Pepcid and Maalox/Mylanta.  No bacteria found in urine.  No UTI symptoms.  Her nausea improved with Zofran and GI cocktail.  Patient given Zofran 4 mg take every 6 hours at home and Pepcid 20 mg twice daily.     01/03/2024 patient seen by PCP for poor appetite and weight loss.  Discussed that she has significant illness in November when she was hospitalized with a UTI caused by ESBL.  At that point noted her weight was down about 6 pounds since her visit earlier that month and about 20 pounds since November.  Patient given Zofran Protonix.    01/12/2024 patient again seen by PCP and continued generalized fatigue and decreased appetite.  Flu and COVID-negative.    Today, the patient presents to clinic and tells me that she is actually doing just fine now.   Her appetite has picked up and she does not feel like she is still losing weight, tells me that lately she has been running around 148-150 pounds and today was 143 in clinic.  Describes episodes as above with extreme nausea that seem to hit out of the blue, but now all of this is better.  She is not needing her Zofran any further and does not feel like she gets full fast.  She is currently on her Pantoprazole 40 mg daily and has been taking this for the past 10 days.  Again she feels well with no real concerns today.    Used to work as a Health visitor over at neurology.    Denies fever, chills, nausea, vomiting, abdominal pain or symptoms that awaken her from sleep.  Past Medical History:  Diagnosis Date   Arthritis    Cancer (HCC)    breast   Depression    GERD (gastroesophageal reflux disease)    Hyperlipidemia    Tendonitis     Past Surgical History:  Procedure Laterality Date   BREAST SURGERY     right   ELBOW SURGERY     right    Current Outpatient Medications  Medication Sig Dispense Refill   buPROPion (WELLBUTRIN XL) 150 MG 24 hr tablet TAKE 3 TABLETS EVERY DAY (Patient taking differently: Take 150 mg by mouth.) 270 tablet 3  donepezil (ARICEPT) 10 MG tablet Take 10 mg by mouth at bedtime.     lovastatin (MEVACOR) 20 MG tablet Take 20 mg by mouth at bedtime.     Multiple Vitamin (MULTIVITAMIN) tablet Take 1 tablet by mouth daily.     mupirocin ointment (BACTROBAN) 2 % Apply inside of nares twice daily x7 days. 22 g 0   ondansetron (ZOFRAN-ODT) 4 MG disintegrating tablet Take 1 tablet (4 mg total) by mouth every 8 (eight) hours as needed for nausea or vomiting. 30 tablet 0   pantoprazole (PROTONIX) 40 MG tablet Take 1 tablet (40 mg total) by mouth daily. 30 tablet 3   PARoxetine (PAXIL) 20 MG tablet Take 2 tablets (40 mg total) by mouth daily. 180 tablet 3   Rimegepant Sulfate (NURTEC) 75 MG TBDP Take 1 tablet (75 mg total) by mouth daily as needed (migraine). 30 tablet 1    SUMAtriptan (IMITREX) 25 MG tablet Take 1 tablet (25 mg total) by mouth every 2 (two) hours as needed for migraine. May repeat in 2 hours if headache persists or recurs. 10 tablet 2   trimethoprim (TRIMPEX) 100 MG tablet TAKE 1 TABLET EVERY DAY 90 tablet 3   Calcium Carbonate-Vit D-Min (CALCIUM 1200 PO) Take 1 tablet by mouth daily. (Patient not taking: Reported on 01/13/2024)     cephALEXin (KEFLEX) 250 MG capsule  (Patient not taking: Reported on 01/13/2024)     clonazePAM (KLONOPIN) 1 MG tablet Take 1 tablet (1 mg total) by mouth 2 (two) times daily as needed. (Patient not taking: Reported on 01/13/2024) 60 tablet 1   No current facility-administered medications for this visit.    Allergies as of 01/13/2024   (No Known Allergies)    Family History  Problem Relation Age of Onset   Cancer Mother        bone   Hypertension Father    Diabetes Father    Stroke Father    Hypertension Sister    Alzheimer's disease Brother    Hypertension Sister    Colon cancer Neg Hx    Esophageal cancer Neg Hx    Rectal cancer Neg Hx    Stomach cancer Neg Hx     Social History   Socioeconomic History   Marital status: Married    Spouse name: Cindy [name]   Number of children: 0   Years of education: 18   Highest education level: Master's degree (e.g., MA, MS, MEng, MEd, MSW, MBA)  Occupational History   Occupation: Retired.  Tobacco Use   Smoking status: Never   Smokeless tobacco: Never  Vaping Use   Vaping status: Never Used  Substance and Sexual Activity   Alcohol use: Not Currently    Comment: rarely   Drug use: No   Sexual activity: Yes    Birth control/protection: Post-menopausal  Other Topics Concern   Not on file  Social History Narrative   Lives with her husband. They help take care of her brother's grandchildren. She enjoys singing in the choir and gardening.   Social Drivers of Corporate investment banker Strain: Low Risk  (12/28/2022)   Overall Financial Resource Strain (CARDIA)     Difficulty of Paying Living Expenses: Not hard at all  Food Insecurity: No Food Insecurity (11/10/2023)   Hunger Vital Sign    Worried About Running Out of Food in the Last Year: Never true    Ran Out of Food in the Last Year: Never true  Transportation Needs: No Transportation Needs (11/10/2023)  PRAPARE - Administrator, Civil Service (Medical): No    Lack of Transportation (Non-Medical): No  Physical Activity: Inactive (12/28/2022)   Exercise Vital Sign    Days of Exercise per Week: 0 days    Minutes of Exercise per Session: 0 min  Stress: No Stress Concern Present (12/28/2022)   [employer] of Occupational Health - Occupational Stress Questionnaire    Feeling of Stress : Not at all  Social Connections: Socially Integrated (12/28/2022)   Social Connection and Isolation Panel [NHANES]    Frequency of Communication with Friends and Family: More than three times a week    Frequency of Social Gatherings with Friends and Family: More than three times a week    Attends Religious Services: More than 4 times per year    Active Member of [employer] or Organizations: Yes    Attends Engineer, structural: More than 4 times per year    Marital Status: Married  Catering manager Violence: Not At Risk (11/10/2023)   Humiliation, Afraid, Rape, and Kick questionnaire    Fear of Current or Ex-Partner: No    Emotionally Abused: No    Physically Abused: No    Sexually Abused: No    Review of Systems:    Constitutional: No fever or chills Skin: No rash or itching Cardiovascular: No chest pain Respiratory: No SOB  Gastrointestinal: See HPI and otherwise negative Genitourinary: No dysuria  Neurological: No headache, dizziness or syncope Musculoskeletal: No new muscle or joint pain Hematologic: No bleeding  Psychiatric: No history of depression or anxiety   Physical Exam:  Vital signs: BP 118/70   Pulse 89   Ht 5\' 8"  (1.727 m)   Wt 143 lb (64.9 kg)   BMI 21.74 kg/m     Constitutional:   Pleasant elderly Caucasian female appears to be in NAD, Well developed, Well nourished, alert and cooperative Head:  Normocephalic and atraumatic. Eyes:   PEERL, EOMI. No icterus. Conjunctiva pink. Ears:  Normal auditory acuity. Neck:  Supple Throat: Oral cavity and pharynx without inflammation, swelling or lesion.  Respiratory: Respirations even and unlabored. Lungs clear to auscultation bilaterally.   No wheezes, crackles, or rhonchi.  Cardiovascular: Normal S1, S2. No MRG. Regular rate and rhythm. No peripheral edema, cyanosis or pallor.  Gastrointestinal:  Soft, nondistended, nontender. No rebound or guarding. Normal bowel sounds. No appreciable masses or hepatomegaly. Rectal:  Not performed.  Msk:  Symmetrical without gross deformities. Without edema, no deformity or joint abnormality.  Neurologic:  Alert and  oriented x4;  grossly normal neurologically.  Skin:   Dry and intact without significant lesions or rashes. Psychiatric: Demonstrates good judgement and reason without abnormal affect or behaviors.  RELEVANT LABS AND IMAGING: CBC    Component Value Date/Time   WBC 6.1 01/03/2024 1457   WBC 6.0 12/27/2023 1130   RBC 4.24 01/03/2024 1457   RBC 4.25 12/27/2023 1130   HGB 11.1 01/03/2024 1457   HGB 13.3 01/16/2010 1511   HGB 12.6 01/27/2008 1518   HCT 35.1 01/03/2024 1457   HCT 40.5 01/16/2010 1511   HCT 39.2 01/27/2008 1518   PLT 284 01/03/2024 1457   MCV 83 01/03/2024 1457   MCV 84 01/16/2010 1511   MCV 84.5 01/27/2008 1518   MCH 26.2 (L) 01/03/2024 1457   MCH 27.3 12/27/2023 1130   MCHC 31.6 01/03/2024 1457   MCHC 32.3 12/27/2023 1130   RDW 15.4 01/03/2024 1457   RDW 12.7 01/16/2010 1511   RDW 14.7 (  H) 01/27/2008 1518   LYMPHSABS 1.7 01/03/2024 1457   LYMPHSABS 2.1 01/16/2010 1511   LYMPHSABS 2.0 01/27/2008 1518   MONOABS 0.5 11/10/2023 1546   MONOABS 0.5 01/27/2008 1518   EOSABS 0.1 01/03/2024 1457   EOSABS 0.1 01/16/2010 1511    BASOSABS 0.0 01/03/2024 1457   BASOSABS 0.0 01/16/2010 1511   BASOSABS 0.0 01/27/2008 1518    CMP     Component Value Date/Time   NA 142 01/03/2024 1457   K 4.1 01/03/2024 1457   CL 104 01/03/2024 1457   CO2 24 01/03/2024 1457   GLUCOSE 76 01/03/2024 1457   GLUCOSE 94 12/27/2023 1130   BUN 5 (L) 01/03/2024 1457   CREATININE 0.78 01/03/2024 1457   CREATININE 1.02 12/16/2022 1153   CALCIUM 9.3 01/03/2024 1457   PROT 6.4 01/03/2024 1457   ALBUMIN 3.9 01/03/2024 1457   AST 15 01/03/2024 1457   ALT 12 01/03/2024 1457   ALKPHOS 118 01/03/2024 1457   BILITOT 0.3 01/03/2024 1457   GFRNONAA >60 12/27/2023 1130   GFRNONAA 65 11/22/2020 1116   GFRAA 76 11/22/2020 1116    Assessment: 1.  Nausea: Was experiencing, symptoms now gone after starting Pantoprazole 40 mg daily for the past 10 days; likely gastritis 2.  Weight loss: Likely related to early satiety and nausea, no longer losing weight per the patient, she will keep an eye on this 3.  Early satiety: Was experiencing, symptoms now gone  Plan: 1.  In clinic today the patient tells me she feels fine.  She is no longer experiencing any of the symptoms above and is almost back to her regular weight of around 248-250 pounds.  Her appetite has picked up and she is no longer nauseous and eating normal foods. 2.  Told the patient to keep an eye on her weight, if she starts losing weight without trying again or her symptoms return she will call and let me know. 3.  For now I would continue the Pantoprazole 40 mg once daily for the next month and then she can taper off by taking 1 every other day for a week and then stop.  She has required a PPI daily in the past, but apparently stopped it sometime over the past couple of years, she may require this going forward if symptoms return. 4.  Patient will follow with [place] as needed.  She was assigned to Dr. Doy Hutching today as Dr. Russella Dar is retired.  Cindy Meeker, PA-C [facility] 01/13/2024, 1:16 PM  Cc: Everrett Coombe, DO   I have reviewed the clinic note as outlined by Cindy Meeker, PA and agree with the assessment, plan and medical decision making. Ms. Brugger was referred to the office for evaluation and management of nausea, weight loss and early satiety.  She was admitted to the hospital in November 2024 with UTI and was noted to have associated weight loss.  Over the month of January 2025 she was seen by her PCP on 2 occasions for poor appetite and weight loss.  Reported significant nausea but did not have any vomiting.  She was given a PPI.  At the time of her GI evaluation symptoms had improved on pantoprazole and she was gaining weight once again.  I agree that she can proceed with conservative management and continue on PPI.  If symptoms recur or weight loss resumes we can reevaluate further workup.  Cindy Beach, MD

## 2024-01-13 NOTE — Patient Instructions (Signed)
_______________________________________________________  If your blood pressure at your visit was 140/90 or greater, please contact your primary care physician to follow up on this.  _______________________________________________________  If you are age 71 or older, your body mass index should be between 23-30. Your Body mass index is 21.74 kg/m. If this is out of the aforementioned range listed, please consider follow up with your Primary Care Provider.  If you are age 42 or younger, your body mass index should be between 19-25. Your Body mass index is 21.74 kg/m. If this is out of the aformentioned range listed, please consider follow up with your Primary Care Provider.   ________________________________________________________  The [facility] providers would like to encourage you to use [facility] to communicate with providers for non-urgent requests or questions.  Due to long hold times on the telephone, sending your provider a message by [facility] may be a faster and more efficient way to get a response.  Please allow 48 business hours for a response.  Please remember that this is for non-urgent requests.   It was a pleasure to see you today!  Thank you for trusting me with your gastrointestinal care!

## 2024-01-19 ENCOUNTER — Ambulatory Visit (INDEPENDENT_AMBULATORY_CARE_PROVIDER_SITE_OTHER): Payer: PPO | Admitting: Family Medicine

## 2024-01-19 ENCOUNTER — Encounter: Payer: Self-pay | Admitting: Family Medicine

## 2024-01-19 VITALS — BP 134/70 | HR 86 | Ht 68.0 in | Wt 143.3 lb

## 2024-01-19 DIAGNOSIS — G3184 Mild cognitive impairment, so stated: Secondary | ICD-10-CM

## 2024-01-19 DIAGNOSIS — R110 Nausea: Secondary | ICD-10-CM

## 2024-01-19 DIAGNOSIS — F418 Other specified anxiety disorders: Secondary | ICD-10-CM | POA: Diagnosis not present

## 2024-01-19 MED ORDER — FAMOTIDINE 20 MG PO TABS: 20.0000 mg | ORAL_TABLET | Freq: Two times a day (BID) | ORAL | 3 refills | Status: DC

## 2024-01-19 MED ORDER — MIRTAZAPINE 7.5 MG PO TABS: 7.5000 mg | ORAL_TABLET | Freq: Every day | ORAL | 3 refills | Status: DC

## 2024-01-19 NOTE — Patient Instructions (Addendum)
 Continue pantoprazole  once per day Continue famotidine  once in the morning and once in the evening.  Start mirtazapine  7.5mg  at bedtime.  See me in 4-6 weeks.

## 2024-01-19 NOTE — Assessment & Plan Note (Signed)
 Overall her nausea has improved.  Still with intermittent episodes.  She will continue pantoprazole  with famotidine  for now.  I am adding mirtazapine  to help with appetite stimulation as she has discontinued her bupropion 

## 2024-01-19 NOTE — Assessment & Plan Note (Signed)
 She did discontinue bupropion  as she thought it may be causing her more nausea.  Adding mirtazapine  at bedtime.

## 2024-01-19 NOTE — Progress Notes (Signed)
 Cindy [name] - 73 y.o. female MRN [id number]  Date of birth: 08/27/1951  Subjective Chief Complaint  Patient presents with   Medical Management of Chronic Issues    HPI Cindy [name] is a 73 y.o. female here today for follow up visit.   She was last seen by me abou 1.5 weeks ago due to concerns about poor appetite and weight loss.  She has been working on PO intake.  She was evaluated by GI.  I have reviewed their notes.  She told them that she was feeling fine.  They did not plan for any further testing or intervention at this time.  She tells me today that she does sometimes continue to have intermittent episodes of decreased appetite and feeling sick on her stomach.  Overall this has improved some.  She does feel like the pantoprazole  and famotidine  have helped.  ROS:  A comprehensive ROS was completed and negative except as noted per HPI  No Known Allergies  Past Medical History:  Diagnosis Date   Arthritis    Cancer (HCC)    breast   Depression    GERD (gastroesophageal reflux disease)    Hyperlipidemia    Tendonitis     Past Surgical History:  Procedure Laterality Date   BREAST SURGERY     right   ELBOW SURGERY     right    Social History   Socioeconomic History   Marital status: Married    Spouse name: Todd   Number of children: 0   Years of education: 18   Highest education level: Master's degree (e.g., MA, MS, MEng, MEd, MSW, MBA)  Occupational History   Occupation: Retired.   Occupation: retired  Tobacco Use   Smoking status: Never   Smokeless tobacco: Never  Vaping Use   Vaping status: Never Used  Substance and Sexual Activity   Alcohol use: Not Currently    Comment: rarely   Drug use: No   Sexual activity: Yes    Birth control/protection: Post-menopausal  Other Topics Concern   Not on file  Social History Narrative   Lives with her husband. They help take care of her brother's grandchildren. She enjoys singing in the choir and gardening.    Social Drivers of Corporate Investment Banker Strain: Low Risk  (12/28/2022)   Overall Financial Resource Strain (CARDIA)    Difficulty of Paying Living Expenses: Not hard at all  Food Insecurity: No Food Insecurity (11/10/2023)   Hunger Vital Sign    Worried About Running Out of Food in the Last Year: Never true    Ran Out of Food in the Last Year: Never true  Transportation Needs: No Transportation Needs (11/10/2023)   PRAPARE - Administrator, Civil Service (Medical): No    Lack of Transportation (Non-Medical): No  Physical Activity: Inactive (12/28/2022)   Exercise Vital Sign    Days of Exercise per Week: 0 days    Minutes of Exercise per Session: 0 min  Stress: No Stress Concern Present (12/28/2022)   [employer] of Occupational Health - Occupational Stress Questionnaire    Feeling of Stress : Not at all  Social Connections: Socially Integrated (12/28/2022)   Social Connection and Isolation Panel [NHANES]    Frequency of Communication with Friends and Family: More than three times a week    Frequency of Social Gatherings with Friends and Family: More than three times a week    Attends Religious Services: More than 4 times  per year    Active Member of Clubs or Organizations: Yes    Attends Banker Meetings: More than 4 times per year    Marital Status: Married    Family History  Problem Relation Age of Onset   Cancer Mother        bone   Hypertension Father    Diabetes Father    Stroke Father    Hypertension Sister    Alzheimer's disease Brother    Hypertension Sister    Colon cancer Neg Hx    Esophageal cancer Neg Hx    Rectal cancer Neg Hx    Stomach cancer Neg Hx     Health Maintenance  Topic Date Due   Medicare Annual Wellness (AWV)  12/29/2023   COVID-19 Vaccine (7 - 2024-25 season) 01/27/2025 (Originally 08/15/2023)   MAMMOGRAM  07/20/2024   DTaP/Tdap/Td (4 - Td or Tdap) 04/05/2028   Colonoscopy  08/28/2029   Pneumonia Vaccine  46+ Years old  Completed   INFLUENZA VACCINE  Completed   DEXA SCAN  Completed   Hepatitis C Screening  Completed   Zoster Vaccines- Shingrix  Completed   HPV VACCINES  Aged Out     ----------------------------------------------------------------------------------------------------------------------------------------------------------------------------------------------------------------- Physical Exam BP 134/70 (BP Location: Right Arm, Patient Position: Sitting, Cuff Size: Normal)   Pulse 86   Ht 5' 8 (1.727 m)   Wt 143 lb 4.8 oz (65 kg)   SpO2 100%   BMI 21.79 kg/m   Physical Exam Constitutional:      Appearance: Normal appearance.  HENT:     Head: Normocephalic and atraumatic.  Eyes:     General: No scleral icterus. Cardiovascular:     Rate and Rhythm: Normal rate and regular rhythm.  Pulmonary:     Effort: Pulmonary effort is normal.     Breath sounds: Normal breath sounds.  Neurological:     Mental Status: She is alert.  Psychiatric:        Mood and Affect: Mood normal.        Behavior: Behavior normal.     ------------------------------------------------------------------------------------------------------------------------------------------------------------------------------------------------------------------- Assessment and Plan  Mild cognitive impairment with memory loss She did discontinue Aricept  as she was having nightmares with this.  I encouraged her to follow-up with neurology to discuss alternatives.  Nausea Overall her nausea has improved.  Still with intermittent episodes.  She will continue pantoprazole  with famotidine  for now.  I am adding mirtazapine  to help with appetite stimulation as she has discontinued her bupropion   Depression with anxiety She did discontinue bupropion  as she thought it may be causing her more nausea.  Adding mirtazapine  at bedtime.   Meds ordered this encounter  Medications   famotidine  (PEPCID ) 20 MG tablet     Sig: Take 1 tablet (20 mg total) by mouth 2 (two) times daily.    Dispense:  60 tablet    Refill:  3   mirtazapine  (REMERON ) 7.5 MG tablet    Sig: Take 1 tablet (7.5 mg total) by mouth at bedtime.    Dispense:  30 tablet    Refill:  3    Return in about 4 weeks (around 02/16/2024) for Appetite/GI upset.    This visit occurred during the SARS-CoV-2 public health emergency.  Safety protocols were in place, including screening questions prior to the visit, additional usage of staff PPE, and extensive cleaning of exam room while observing appropriate contact time as indicated for disinfecting solutions.

## 2024-01-19 NOTE — Assessment & Plan Note (Signed)
 She did discontinue Aricept  as she was having nightmares with this.  I encouraged her to follow-up with neurology to discuss alternatives.

## 2024-01-24 NOTE — Progress Notes (Signed)
```````````````````````````````````````````````````  [facility]ology Return Visit   Referring Provider Cindy Coombe, DO [address] [facility] [address]  [address] [place],  [place] [address]  Primary Care Provider Cindy Coombe, DO  Patient Profile: Cindy [name] is a 73 y.o. female with a past medical history noteworthy for breast cancer, HLD, arthritis, anxiety, depression and migraines who returns to the [facility] for follow-up of the problem(s) noted below.  Problem List: Nausea Weight loss GERD Constipation  History of Present Illness   Cindy [name] was last seen in the GI office 01/13/2024 by Hyacinth Meeker, PA   Current GI Meds  Pantoprazole 40 mg orally daily-patient is not sure if Cindy [name] is taking this medication Famotidine 20 mg p.o. twice daily Mirtazapine 7.5 mg p.o. nightly Ondansetron 4 mg p.o. every 8 hours as needed for nausea and vomiting  It is noted that patient is unclear regarding her medications and has difficulty verifying which ones Cindy [name] is taking  Interval History  Cindy [name] is accompanied to the office today by her husband who provides history in conjunction with her.  [name] and her husband report that Cindy [name] was in her usual state of health until November 2024 when Cindy [name] began experiencing significant nausea, vomiting and dysuria Cindy [name] was admitted to [facility] -diagnosed with possible gastroenteritis and urine ESBL colonization States that symptoms improved transiently after her hospitalization.  Records indicate 1 ER visit and to PCP appointments in January 2025 for generalized fatigue, poor appetite, weight loss and nausea Cindy [name] has been treated with Zofran, Pepcid, Maalox, Mylanta, GI cocktail, Protonix, mirtazapine  At the time of her last visit in our office 2 weeks ago Cindy [name] reported that Cindy [name] was doing fine and that her appetite had improved Records from that visit document that Cindy [name] was taking pantoprazole 40 mg  orally daily for 10 days  Today, Cindy [name] and her husband are unclear regarding her current medications Cindy [name] is not sure which of the medications are in her medication box  Cindy [name] notes that Cindy [name] continues to have nausea but is vague on frequency Reviewed that Cindy [name] should be taking her Protonix daily and Cindy [name] will check her medication box regarding this No vomiting or abdominal pain No focal neurologic deficits Nausea not necessarily associated with migraines  Cindy [name] has been more constipated lately and we discussed that this could be contributing to her nausea Having 1 hard bowel movement every 3 days Cindy [name] has been using Dulcolax and Colace as needed and we discussed the importance of a more regular bowel regimen  Weight today is 142 pounds -this is down from a previous weight of 170 pounds in fall 2024   Last colonoscopy:  08/2019 -2 polyps-hyperplastic Last endoscopy: None  Last Abd CT/CTE/MRE: 03/2019 CTAP -Long segment of acute colitis in the descending colon.  Mild distention of ureters palp possibly representing sequelae of chronic outflow obstruction or reflux.  GI Review of Symptoms Significant for nausea, constipation, loss of appetite. Otherwise negative.  General Review of Systems  Review of systems is significant for the pertinent positives and negatives as listed per the HPI.  Full ROS is otherwise negative.  Past Medical History   Past Medical History:  Diagnosis Date   Arthritis    Cancer (HCC)    breast   Depression    GERD (gastroesophageal reflux disease)    Hyperlipidemia    Tendonitis      Past Surgical History   Past Surgical History:  Procedure Laterality Date   BREAST SURGERY  right   ELBOW SURGERY     right     Allergies and Medications  No Known Allergies  @MEDSTODAY @  Family History   Family History  Problem Relation Age of Onset   Cancer Mother        bone   Hypertension Father    Diabetes Father    Stroke Father    Hypertension Sister     Alzheimer's disease Brother    Hypertension Sister    Colon cancer Neg Hx    Esophageal cancer Neg Hx    Rectal cancer Neg Hx    Stomach cancer Neg Hx     Social History   Social History   Tobacco Use   Smoking status: Never   Smokeless tobacco: Never  Vaping Use   Vaping status: Never Used  Substance Use Topics   Alcohol use: Not Currently    Comment: rarely   Drug use: No   Luretha reports that Cindy [name] has never smoked. Cindy [name] has never used smokeless tobacco. Cindy [name] reports that Cindy [name] does not currently use alcohol. Cindy [name] reports that Cindy [name] does not use drugs.  Vital Signs and Physical Examination   Vitals:   01/26/24 0938  BP: 104/62  Pulse: 90  SpO2: 97%   Body mass index is 21.59 kg/m. Weight: 142 lb (64.4 kg)  General: Well developed, well nourished, no acute distress Head: Normocephalic and atraumatic Eyes: Sclerae anicteric, EOMI Lungs: Clear throughout to auscultation Heart: Regular rate and rhythm; No murmurs, rubs or bruits Abdomen: Soft, non tender and non distended. No masses, hepatosplenomegaly or hernias noted. Normal Bowel sounds Rectal: Deferred Musculoskeletal: Symmetrical with no gross deformities  Pulses:  Normal pulses noted Extremities: No edema or deformities noted Neurological: Alert oriented x 4, grossly nonfocal Psychological:  Alert and cooperative. Anxious   Review of Data  The following data was reviewed at the time of this encounter:  Laboratory Studies      Latest Ref Rng & Units 01/03/2024    2:57 PM 12/27/2023   11:30 AM 12/23/2023   10:15 AM  CBC  WBC 3.4 - 10.8 x10E3/uL 6.1  6.0  5.9   Hemoglobin 11.1 - 15.9 g/dL 25.9  56.3  87.5   Hematocrit 34.0 - 46.6 % 35.1  35.9  34.4   Platelets 150 - 450 x10E3/uL 284  282  262     Lab Results  Component Value Date   LIPASE 27 01/03/2024      Latest Ref Rng & Units 01/03/2024    2:57 PM 12/27/2023   11:30 AM 12/23/2023   10:15 AM  CMP  Glucose 70 - 99 mg/dL 76  94  94   BUN 8 - 27 mg/dL  5  6  7    Creatinine 0.57 - 1.00 mg/dL 6.43  3.29  5.18   Sodium 134 - 144 mmol/L 142  135  141   Potassium 3.5 - 5.2 mmol/L 4.1  3.7  4.1   Chloride 96 - 106 mmol/L 104  102  104   CO2 20 - 29 mmol/L 24  22  23    Calcium 8.7 - 10.3 mg/dL 9.3  8.7  8.8   Total Protein 6.0 - 8.5 g/dL 6.4  7.0  5.9   Total Bilirubin 0.0 - 1.2 mg/dL 0.3  0.9  0.4   Alkaline Phos 44 - 121 IU/L 118  91  115   AST 0 - 40 IU/L 15  15  15    ALT 0 - 32 IU/L  12  11  8       Imaging Studies  03/2019 CTAP  Long segment of acute colitis in the descending colon.  Mild distention of ureters palp possibly representing sequelae of chronic outflow  GI Procedures and Studies  Colonoscopy 08/2019 2 polyps-hyperplastic  Colonoscopy 11/2011 Normal  Colonoscopy 08/2001 Normal  Clinical Impression  It is my clinical impression that Cindy [name] is a 73 y.o. female with;  Nausea Weight loss GERD Constipation  Cindy [name] presents with a several month history of intermittent nausea, diminished appetite and weight loss.  At the onset of her symptoms Cindy [name] was hospitalized in 10/2023 for presumed urinary tract infection.  Despite receiving treatment for this Cindy [name] is continue to complain of symptoms of nausea unrelated to her migraine headaches.  Cindy [name] does have a pertinent past medical history of GERD.  Also noted that Cindy [name] is experiencing constipation recently which could be contributing to upper GI tract symptoms.  Cindy [name] has been prescribed a cadre of medications including: Zofran, Pepcid, Maalox, Mylanta, GI cocktail, Protonix, mirtazapine.  At the time of her last visit 2 weeks ago in the GI office Cindy [name] informed our physician assistant that Cindy [name] was feeling well and had been taking Protonix 40 mg orally daily for 10 days.  At Today's visit, Cindy [name] and her husband are unclear regarding the medication Cindy [name] is taking.  Cindy [name] is not sure that Cindy [name] is on Protonix or famotidine.  Cindy [name] is noting recurrent symptoms which could be due to the fact  that Cindy [name] may not be taking her medications.  Also reviewed the importance of an appropriate bowel regimen as constipation may result in upper GI symptoms.  I have advised ensuring that Cindy [name] is taking her Protonix and famotidine regularly.  I have also recommended taking Colace daily as well as Dulcolax every day or every other day in order to regulate bowel movements.  If symptoms are not improving in the future or Cindy [name] continues to lose weight it may be appropriate to perform an upper endoscopy and consideration of cross-sectional imaging of the chest abdomen and pelvis to rule out malignancy.  Plan  Continue Protonix 40 mg orally daily Continue famotidine 20 mg p.o. twice daily Continue ondansetron 4 mg every 8 hours as needed for nausea and vomiting Resume mirtazapine 7.5 mg p.o. nightly Take Colace daily Use Dulcolax every night or every other night and titrate to effect for regular bowel movements Monitor weight and anthropometrics If symptoms are not improving will consider EGD +/- CT chest abdomen pelvis for evaluation of weight loss Next screening colonoscopy due 2030   Planned Follow Up 2 months  The patient or caregiver verbalized understanding of the material covered, with no barriers to understanding. All questions were answered. Patient or caregiver is agreeable with the plan outlined above.    It was a pleasure to see Cindy [name].  If you have any questions or concerns regarding this evaluation, do not hesitate to contact me.  Maren Beach, MD [facility]   I spent total of 40 minutes in both face-to-face and non-face-to-face activities, excluding procedures performed, for the visit on the date of this encounter.

## 2024-01-26 ENCOUNTER — Encounter: Payer: Self-pay | Admitting: Pediatrics

## 2024-01-26 ENCOUNTER — Ambulatory Visit: Payer: BLUE CROSS/BLUE SHIELD | Admitting: Family Medicine

## 2024-01-26 ENCOUNTER — Ambulatory Visit (INDEPENDENT_AMBULATORY_CARE_PROVIDER_SITE_OTHER): Payer: PPO | Admitting: Pediatrics

## 2024-01-26 VITALS — BP 104/62 | HR 90 | Ht 68.0 in | Wt 142.0 lb

## 2024-01-26 DIAGNOSIS — K5900 Constipation, unspecified: Secondary | ICD-10-CM | POA: Diagnosis not present

## 2024-01-26 DIAGNOSIS — R634 Abnormal weight loss: Secondary | ICD-10-CM

## 2024-01-26 DIAGNOSIS — R110 Nausea: Secondary | ICD-10-CM

## 2024-01-26 DIAGNOSIS — K219 Gastro-esophageal reflux disease without esophagitis: Secondary | ICD-10-CM | POA: Diagnosis not present

## 2024-01-26 MED ORDER — PANTOPRAZOLE SODIUM 40 MG PO TBEC: 40.0000 mg | DELAYED_RELEASE_TABLET | Freq: Every day | ORAL | 3 refills | Status: DC

## 2024-01-26 NOTE — Patient Instructions (Addendum)
We have sent the following medications to your pharmacy for you to pick up at your convenience: Pantoprazole 40 mg: Take once daily 20 to 30 minutes before a meal  Continue colace  Take over-the-counter Doculax every other evening for 2 weeks.  Please MyChart or call with an update in 2 weeks. Dr. Terie Purser nurse is Sherrie Mustache have been scheduled for a follow up appointment with Quentin Mulling, PA on 04-11-24 at 10:20 am. Please arrive 10 minutes early for registration. If you need to reschedule or cancel this appointment please call [phone] as soon as possible. Thank you.  Thank you for entrusting me with your care and for choosing [facility], Dr. Maren Beach   If your blood pressure at your visit was 140/90 or greater, please contact your primary care physician to follow up on this. ______________________________________________________  If you are age 11 or older, your body mass index should be between 23-30. Your Body mass index is 21.59 kg/m. If this is out of the aforementioned range listed, please consider follow up with your Primary Care Provider.  If you are age 41 or younger, your body mass index should be between 19-25. Your Body mass index is 21.59 kg/m. If this is out of the aformentioned range listed, please consider follow up with your Primary Care Provider.  ________________________________________________________  The [facility] providers would like to encourage you to use [facility] to communicate with providers for non-urgent requests or questions.  Due to long hold times on the telephone, sending your provider a message by [facility] may be a faster and more efficient way to get a response.  Please allow 48 business hours for a response.  Please remember that this is for non-urgent requests.  _______________________________________________________  Due to recent changes in healthcare laws, you may see the results of your imaging and laboratory studies on  MyChart before your provider has had a chance to review them.  We understand that in some cases there may be results that are confusing or concerning to you. Not all laboratory results come back in the same time frame and the provider may be waiting for multiple results in order to interpret others.  Please give [place] 48 hours in order for your provider to thoroughly review all the results before contacting the office for clarification of your results.

## 2024-02-07 ENCOUNTER — Other Ambulatory Visit: Payer: Self-pay | Admitting: Family Medicine

## 2024-02-07 ENCOUNTER — Ambulatory Visit: Payer: Self-pay | Admitting: Family Medicine

## 2024-02-07 ENCOUNTER — Other Ambulatory Visit: Payer: Self-pay | Admitting: Neurology

## 2024-02-07 DIAGNOSIS — R21 Rash and other nonspecific skin eruption: Secondary | ICD-10-CM

## 2024-02-07 NOTE — Telephone Encounter (Signed)
 Copied from CRM [id number]. Topic: Clinical - Red Word Triage >> Feb 07, 2024  2:07 PM Elle L wrote: Red Word that prompted transfer to Nurse Triage: The patient is having severe pain and burning from a suspected UTI. She advised that Dr. Ashley Royalty usually prescribes a medication for her to have on hand but she has run out of the medication. >> Feb 07, 2024  4:58 PM Turkey B wrote: Pt called back still waiting for further assistance for pain with UTI  Patient triaged earlier today for UTI symptoms. She declined an appointment and wanted to have someone call back about an antibiotic. I advised the patient that messages aren't always seen same day and that she may need an appointment for an antibiotic. Patient declined any appointments and states she will call back in the morning.    Reason for Disposition  Prescription request for new medicine (not a refill)  Protocols used: Medication Question Call-A-AH

## 2024-02-07 NOTE — Telephone Encounter (Signed)
  Chief Complaint: burning with urination Symptoms: burning with urination, increase frequency Frequency: began last night Pertinent Negatives: Patient denies fever, blood in urine Disposition: [] ED /[] Urgent Care (no appt availability in office) / [x] Appointment(In office/virtual)/ []  [facility]/ [] Home Care/ [x] Refused Recommended Disposition /[] [facility]/ []  Follow-up with PCP Additional Notes: Patient called reporting UTI symptoms, burning with urination and increased frequency that began last night. Reports she has chronic UTI and usually has medication on hand, but has ran out. Patient is requesting PCP call in medication. Per protocol, patient to be evaluated within 24 hours. Patient declines scheduling. Care advice reviewed, patient verbalized understanding and denies further questions at this time. Alerting PCP for review and follow up.    Copied from CRM [id number]. Topic: Clinical - Red Word Triage >> Feb 07, 2024  2:07 PM Elle L wrote: Red Word that prompted transfer to Nurse Triage: The patient is having severe pain and burning from a suspected UTI. She advised that Dr. Ashley Royalty usually prescribes a medication for her to have on hand but she has run out of the medication. Reason for Disposition  Urinating more frequently than usual (i.e., frequency)  Answer Assessment - Initial Assessment Questions 1. SYMPTOM: "What's the main symptom you're concerned about?" (e.g., frequency, incontinence)     Burning with urination, increased frequency 2. ONSET: "When did the  symptoms  start?"     Last night 3. PAIN: "Is there any pain?" If Yes, ask: "How bad is it?" (Scale: 1-10; mild, moderate, severe)     Burning, denies pain 4. CAUSE: "What do you think is causing the symptoms?"     UTI 5. OTHER SYMPTOMS: "Do you have any other symptoms?" (e.g., blood in urine, fever, flank pain, pain with urination)     Burning with urination  Protocols used: Urinary  Symptoms-A-AH

## 2024-02-08 ENCOUNTER — Telehealth: Payer: Self-pay | Admitting: Family Medicine

## 2024-02-08 NOTE — Telephone Encounter (Signed)
 See other message

## 2024-02-08 NOTE — Telephone Encounter (Signed)
 Copied from CRM [id number]. Topic: Clinical - Red Word Triage >> Feb 07, 2024  4:58 PM Turkey B wrote: Pt called back still waiting for further assistance for pain with UTI

## 2024-02-10 ENCOUNTER — Other Ambulatory Visit: Payer: Self-pay | Admitting: Family Medicine

## 2024-02-10 NOTE — Telephone Encounter (Signed)
 Attempted call to patient . Left a voice mail ( detailed ) on pt home # that would need to come in for urinalysis and urine culture lab work . And also asked if still taking the medication to be preventative for UTI's given by urology. Requesting a return call.

## 2024-02-16 ENCOUNTER — Ambulatory Visit: Payer: PPO | Admitting: Family Medicine

## 2024-02-16 NOTE — Telephone Encounter (Signed)
 Patient seen in office today 02/16/24 by Dr. Ashley Royalty.

## 2024-02-28 ENCOUNTER — Other Ambulatory Visit: Payer: Self-pay | Admitting: Neurology

## 2024-02-28 ENCOUNTER — Other Ambulatory Visit: Payer: Self-pay | Admitting: Family Medicine

## 2024-02-28 DIAGNOSIS — R21 Rash and other nonspecific skin eruption: Secondary | ICD-10-CM

## 2024-03-03 ENCOUNTER — Other Ambulatory Visit: Payer: Self-pay | Admitting: Family Medicine

## 2024-04-04 ENCOUNTER — Other Ambulatory Visit: Payer: Self-pay | Admitting: Family Medicine

## 2024-04-04 ENCOUNTER — Other Ambulatory Visit: Payer: Self-pay | Admitting: Neurology

## 2024-04-04 DIAGNOSIS — R21 Rash and other nonspecific skin eruption: Secondary | ICD-10-CM

## 2024-04-11 ENCOUNTER — Ambulatory Visit: Payer: PPO | Admitting: Physician Assistant

## 2024-04-13 ENCOUNTER — Ambulatory Visit: Payer: Self-pay | Admitting: Family Medicine

## 2024-04-17 ENCOUNTER — Ambulatory Visit: Payer: Self-pay | Admitting: Medical-Surgical

## 2024-04-20 ENCOUNTER — Ambulatory Visit (INDEPENDENT_AMBULATORY_CARE_PROVIDER_SITE_OTHER): Admitting: Neurology

## 2024-04-20 ENCOUNTER — Encounter: Payer: Self-pay | Admitting: Neurology

## 2024-04-20 VITALS — BP 132/75 | HR 89 | Ht 67.5 in | Wt 154.0 lb

## 2024-04-20 DIAGNOSIS — R413 Other amnesia: Secondary | ICD-10-CM

## 2024-04-20 DIAGNOSIS — Z820 Family history of epilepsy and other diseases of the nervous system: Secondary | ICD-10-CM

## 2024-04-20 DIAGNOSIS — F0390 Unspecified dementia without behavioral disturbance: Secondary | ICD-10-CM

## 2024-04-20 MED ORDER — MEMANTINE HCL 5 MG PO TABS: 5.0000 mg | ORAL_TABLET | Freq: Two times a day (BID) | ORAL | 5 refills | Status: DC

## 2024-04-20 NOTE — Patient Instructions (Signed)
 We will start you on a different medication for your memory: Namenda (generic name: Memantine), starting at 5 mg twice daily with gradual buildup to 10 mg twice daily. Please note that side effects may include, but are not limited to: nausea, confusion, hallucination, personality changes. If you are having mild side effects, try to stick with the treatment as these initial side effects may go away after the first 10-14 days.

## 2024-04-20 NOTE — Progress Notes (Signed)
 Subjective:    Patient ID: Cindy [name] is a 73 y.o. female.  HPI    Interim history:    Cindy [name] is a 73 year old female with an underlying medical history of reflux disease, hyperlipidemia, breast cancer, arthritis, anxiety, depression, and borderline overweight state, who presents for follow-up consultation of her memory loss.  The patient is unaccompanied today.   I last saw her on 02/10/2023, at which time we increased her donepezil  from 5 mg daily to 10 mg daily.  Her MMSE was 27 out of 30 at the time.  Today, 04/20/2024: She reports her memory has declined.  She feels more forgetful.  Her husband brought her for the exam today waiting in the lobby.  He is very supportive per patient.  She has had quite a few issues with GI dysfunction.  She is no longer on donepezil , not exactly sure when she stopped taking it or why but it could be because of her GI issues.  She has not fallen.  She strives to have good nutrition and good hydration.  She has a appointment with Dr. Cheryll Corti in July.  She saw her PCP on 01/19/2024 and I reviewed the office visit note.  She presented with poor appetite and weight loss.  In reviewing the note, she had stopped the donepezil  secondary to nightmares.  She is no longer taking mirtazapine  which was added back in February 2025.  She likes to work in her yard.  The patient's allergies, current medications, family history, past medical history, past social history, past surgical history and problem list were reviewed and updated as appropriate.    Previously:   02/10/2023: She reports doing well, started generic Aricept  5 mg once daily in early January after her primary care and I had a conversation about starting her on something for memory.  She tolerates the donepezil , she would like to see if we can go up to the 10 mg as she feels that it has actually helped, she feels less forgetful and is able to retain information a little bit better especially after  conversation such as phone conversations and relating the information back to her husband for example.  She had some initial sleepiness from the medication but tolerates it well now, no GI side effects.  She tries to stay active, is more active in the summertime as she works in the yard, she is intending to join a gym in [employer].  She does not currently have a gym membership.  She hydrates well with water, avoids excess caffeine, limits herself to 1 cup of coffee in the morning, rare if any alcohol.    I first met her at the request of her primary care physician on 08/10/2022, at which time she reported an approximately 2-year history of forgetfulness, also difficulty with name recall.  Her MMSE was 29 out of 30 at the time.  We proceeded with further workup in the form of blood work which was benign including B12, ESR, TSH, inflammatory markers, A1c was on the high end of normal.  She had a brain MRI with and without contrast on 08/27/2022 and I reviewed the results:  IMPRESSION: This MRI of the brain with and without contrast shows the following:  1. Brain volume was normal for age though compared to the CT scan from 2017 there has been mild atrophy. 2. T2/FLAIR hypertense foci in the subcortical and deep white matter of the cerebral hemispheres, predominantly in the parietal lobes. 3. No acute findings.  Normal enhancement pattern.   She was also advised to seek evaluation through neuropsychology.  She saw Dr. Cheryll Corti on 10/22/2022 for consultation and initial interview and had formal testing through his office on 11/10/2022 with Eulalia Heys.  She has not had a formal follow-up appointment yet with Dr. Cheryll Corti.     08/10/22: (She) reports an approx. 2 year history of cognitive issues including forgetfulness, difficulty with name recall including people she has known for years.  She has had some trouble with concentration and multitasking.  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  She has a family history of Alzheimer's dementia affecting her brother who passed away at the age of 9 or 26.  He was in a long-term facility for about 6 years prior to his passing and his symptoms of memory issues started in his late 64s or early 70s.  She has had recent stressors in the past 2 to 3 years.  She was very close to her great niece who passed away from melanoma.  She had young kids at the time, she has less contact with her 3 young children now after her passing.  She is the youngest of 4 kids altogether.  Her oldest sister is around 79 years old and is in a retirement community, her second sister is 31 years old and has arthritis but no cognitive issues.  She tries to stay active, enjoys singing in the church choir.  She admits that she does not really exercise in any formal way.  She sleeps well, no evidence of snoring or apneas per husband, no significant gasping at night.  She tries to hydrate well with water, does not drink caffeine daily and rare alcohol.  She is a non-smoker.  She has not fallen, no sudden onset of one-sided weakness or numbness or tingling or droopy face or slurring of speech, no recurrent headaches.  She lives with her husband who had 2 sons, 1 son passed away.  She herself never had any children but has been very close to her nieces and nephew, her brother had 4 children, 1 son and 3 daughters.  Her nephew's daughter was the one she was very close with.  Overall, she comes from a very tight knit family, [place]    Her Past Medical History Is Significant For: Past Medical History:  Diagnosis Date   Arthritis    Cancer (HCC)    breast   Depression    GERD (gastroesophageal reflux disease)    Hyperlipidemia    Tendonitis     Her Past Surgical History Is Significant For: Past Surgical History:  Procedure Laterality Date   BREAST SURGERY     right   ELBOW SURGERY     right    Her Family History Is Significant  For: Family History  Problem Relation Age of Onset   Cancer Mother        bone   Hypertension Father    Diabetes Father    Stroke Father    Hypertension Sister    Hypertension Sister    Alzheimer's disease Brother    Colon cancer Neg Hx    Esophageal cancer Neg Hx    Rectal cancer Neg Hx    Stomach cancer Neg Hx     Her Social History Is Significant For: Social History   Socioeconomic History   Marital status: Married    Spouse name: Jonell Neptune   Number of  children: 0   Years of education: 18   Highest education level: Master's degree (e.g., MA, MS, MEng, MEd, MSW, MBA)  Occupational History   Occupation: Retired.   Occupation: retired  Tobacco Use   Smoking status: Never   Smokeless tobacco: Never  Vaping Use   Vaping status: Never Used  Substance and Sexual Activity   Alcohol use: Not Currently    Comment: rarely   Drug use: No   Sexual activity: Yes    Birth control/protection: Post-menopausal  Other Topics Concern   Not on file  Social History Narrative   Lives with her husband. They help take care of her brother's grandchildren. She enjoys singing in the choir and gardening.   Social Drivers of Corporate investment banker Strain: Low Risk  (12/28/2022)   Overall Financial Resource Strain (CARDIA)    Difficulty of Paying Living Expenses: Not hard at all  Food Insecurity: No Food Insecurity (11/10/2023)   Hunger Vital Sign    Worried About Running Out of Food in the Last Year: Never true    Ran Out of Food in the Last Year: Never true  Transportation Needs: No Transportation Needs (11/10/2023)   PRAPARE - Administrator, Civil Service (Medical): No    Lack of Transportation (Non-Medical): No  Physical Activity: Inactive (12/28/2022)   Exercise Vital Sign    Days of Exercise per Week: 0 days    Minutes of Exercise per Session: 0 min  Stress: No Stress Concern Present (12/28/2022)   [employer] of Occupational Health - Occupational Stress  Questionnaire    Feeling of Stress : Not at all  Social Connections: Socially Integrated (12/28/2022)   Social Connection and Isolation Panel [NHANES]    Frequency of Communication with Friends and Family: More than three times a week    Frequency of Social Gatherings with Friends and Family: More than three times a week    Attends Religious Services: More than 4 times per year    Active Member of [employer] or Organizations: Yes    Attends Engineer, structural: More than 4 times per year    Marital Status: Married    Her Allergies Are:  No Known Allergies:   Her Current Medications Are:  Outpatient Encounter Medications as of 04/20/2024  Medication Sig   Calcium Carbonate-Vit D-Min (CALCIUM 1200 PO) Take 1 tablet by mouth daily.   clonazePAM  (KLONOPIN ) 1 MG tablet Take 1 tablet (1 mg total) by mouth 2 (two) times daily as needed.   lovastatin  (MEVACOR ) 20 MG tablet Take 20 mg by mouth at bedtime.   Multiple Vitamin (MULTIVITAMIN) tablet Take 1 tablet by mouth daily.   clotrimazole -betamethasone  (LOTRISONE ) cream APPLY TO AFFECTED AREA TWICE A DAY (Patient not taking: Reported on 04/20/2024)   donepezil  (ARICEPT ) 10 MG tablet TAKE 1 TABLET BY MOUTH EVERYDAY AT BEDTIME (Patient not taking: Reported on 04/20/2024)   famotidine  (PEPCID ) 20 MG tablet TAKE 1 TABLET BY MOUTH TWICE A DAY (Patient not taking: Reported on 04/20/2024)   mirtazapine  (REMERON ) 7.5 MG tablet TAKE 1 TABLET BY MOUTH AT BEDTIME. (Patient not taking: Reported on 04/20/2024)   ondansetron  (ZOFRAN -ODT) 4 MG disintegrating tablet Take 1 tablet (4 mg total) by mouth every 8 (eight) hours as needed for nausea or vomiting. (Patient not taking: Reported on 04/20/2024)   pantoprazole  (PROTONIX ) 40 MG tablet Take 1 tablet (40 mg total) by mouth daily. Take 20 to 30 minutes before a meal (Patient not taking: Reported on  04/20/2024)   PARoxetine  (PAXIL ) 20 MG tablet Take 2 tablets (40 mg total) by mouth daily. (Patient not taking: Reported on  04/20/2024)   Rimegepant Sulfate (NURTEC) 75 MG TBDP Take 1 tablet (75 mg total) by mouth daily as needed (migraine). (Patient not taking: Reported on 04/20/2024)   SUMAtriptan  (IMITREX ) 25 MG tablet Take 1 tablet (25 mg total) by mouth every 2 (two) hours as needed for migraine. May repeat in 2 hours if headache persists or recurs. (Patient not taking: Reported on 04/20/2024)   topiramate  (TOPAMAX ) 50 MG tablet Take 1 tablet (50 mg total) by mouth 2 (two) times daily. (Patient not taking: Reported on 04/20/2024)   trimethoprim  (TRIMPEX ) 100 MG tablet TAKE 1 TABLET EVERY DAY (Patient not taking: Reported on 04/20/2024)   No facility-administered encounter medications on file as of 04/20/2024.  :  Review of Systems:  Out of a complete 14 point review of systems, all are reviewed and negative with the exception of these symptoms as listed below:  Review of Systems  Neurological:        Patient is here alone for memory follow-up. She reports she has more short term memory loss. She states it is not everyday. She reports she still drives although her husband does a lot of it. Patient states she eats and drinks well. She states she sleeps well. MMSE 23/30 Animals x 11.    Objective:  Neurological Exam  Physical Exam Physical Examination:   Vitals:   04/20/24 0843  BP: 132/75  Pulse: 89   General Examination: The patient is a very pleasant 73 y.o. female in no acute distress. She appears well-developed and well-nourished and well groomed.   HEENT: Normocephalic, atraumatic, pupils are equal, round and reactive to light, extraocular tracking is good without limitation to gaze excursion or nystagmus noted.  Corrective eyeglasses in place.  Hearing is grossly intact. Face is symmetric with normal facial animation. Speech is clear with no dysarthria noted. There is no hypophonia. There is no lip, neck/head, jaw or voice tremor. Neck is supple with full range of passive and active motion. There are no carotid  bruits on auscultation.    Chest: Clear to auscultation without wheezing, rhonchi or crackles noted.   Heart: S1+S2+0, regular and normal without murmurs, rubs or gallops noted.    Abdomen: Soft, non-tender and non-distended.   Extremities: There is no pitting edema in the distal lower extremities bilaterally.    Skin: Warm and dry without trophic changes noted.    Musculoskeletal: exam reveals no obvious joint deformities.    Neurologically:  Mental status: The patient is awake, alert and able to provide her history but does have a tendency to repeat herself.  Speech is clear with normal prosody and enunciation. Thought process is linear. Mood is normal and affect is normal.     04/20/2024    8:50 AM 02/10/2023    7:30 AM 08/10/2022    8:14 AM  MMSE - Mini Mental State Exam  Orientation to time 2 5 5   Orientation to Place 5 5 5   Registration 3 3 3   Attention/ Calculation 4 3 4   Recall 0 2 3  Language- name 2 objects 2 2 2   Language- repeat 1 1 1   Language- follow 3 step command 3 3 3   Language- read & follow direction 1 1 1   Write a sentence 1 1 1   Copy design 1 1 1   Total score 23 27 29     On 08/10/2022: CDT:  4/4, AFT: 13/min. On 02/10/2023: CDT: 4/4, AFT: 10/min.   Cranial nerves II - XII are as described above under HEENT exam.  Motor exam: Normal bulk, strength and tone is noted. There is no action or resting tremor.  Fine motor skills and coordination: Grossly intact. Cerebellar testing: No dysmetria or intention tremor. There is no truncal or gait ataxia.  Sensory exam: intact to light touch in the upper and lower extremities.  Gait, station and balance: She stands easily. No veering to one side is noted. No leaning to one side is noted. Posture is age-appropriate and stance is narrow based. Gait shows normal stride length and normal pace. No problems turning are noted.    Assessment and Plan:    In summary, ALLI CANNIZZO is a very pleasant 74 year old female with an  underlying medical history of reflux disease, hyperlipidemia, breast cancer, arthritis, anxiety, depression, and borderline overweight state, who presents for follow-up consultation of her memory loss.  Her memory scores have declined, she also reports more forgetfulness.  She has probable dementia without any obvious behavioral disturbance.  She has a follow-up appointment with Dr. Cheryll Corti pending for July.  She stopped taking the probably a few months ago, she has had some GI issues, appetite loss and weight loss but these issues have improved and her weight has plateaued.  She is advised to start memantine and low-dose, 5 mg strength twice daily.  We talked about expectations and possible common side effects.  I provided a new prescription.  She is advised to follow-up in this clinic in a few months to see one of our nurse practitioners.  We talked about the importance of staying healthy, pursuing good nutrition, good hydration and staying active physically and mentally.  I answered all her questions today and she was in agreement with our plan. I spent 40 minutes in total face-to-face time and in reviewing records during pre-charting, more than 50% of which was spent in counseling and coordination of care, reviewing test results, reviewing medications and treatment regimen and/or in discussing or reviewing the diagnosis of mild dementia without behavioral disturbance, the prognosis and treatment options. Pertinent laboratory and imaging test results that were available during this visit with the patient were reviewed by me and considered in my medical decision making (see chart for details).

## 2024-05-15 ENCOUNTER — Telehealth: Payer: Self-pay | Admitting: Neurology

## 2024-05-15 NOTE — Telephone Encounter (Signed)
 Spouse Request pt be seen earlier for worsening memory

## 2024-05-16 ENCOUNTER — Telehealth: Payer: Self-pay

## 2024-05-16 ENCOUNTER — Other Ambulatory Visit: Payer: Self-pay | Admitting: Neurology

## 2024-05-16 DIAGNOSIS — R413 Other amnesia: Secondary | ICD-10-CM

## 2024-05-16 DIAGNOSIS — Z820 Family history of epilepsy and other diseases of the nervous system: Secondary | ICD-10-CM

## 2024-05-16 DIAGNOSIS — F0390 Unspecified dementia without behavioral disturbance: Secondary | ICD-10-CM

## 2024-05-16 NOTE — Telephone Encounter (Signed)
 For referral for 07/06/2022, it shows as completed. A new 2025 referral would need to be created.

## 2024-05-16 NOTE — Telephone Encounter (Signed)
 Forwarding to [employer] covering for Dr. Augustus Ledger.  Pended referral

## 2024-05-16 NOTE — Telephone Encounter (Signed)
 Attempted call to patient left a voice mail requesting a return call.

## 2024-05-16 NOTE — Telephone Encounter (Signed)
 Spoke with patient - wants to continue with [facility] but does not want the same doctor she was seeing - Dr. Omar Bibber

## 2024-05-16 NOTE — Telephone Encounter (Signed)
 Sending to referral team to see if they can assist with a sooner neurology appt?

## 2024-05-16 NOTE — Telephone Encounter (Signed)
 Copied from CRM [id number]. Topic: General - Other >> May 16, 2024  9:27 AM Retta Caster wrote: Reason for CRM: Patient husband requesting  PCP to call [facility] due to they are billing wrong insurance.   Ambulatory referral to Neurology (Order # [id number]) on 07/06/2022-Also Patient husband needs call back ASAP due to they due not have her scheduled till Aug and she needs [facility] for severe testing.She needs to get in ASAP. [phone]

## 2024-05-17 ENCOUNTER — Other Ambulatory Visit: Payer: Self-pay | Admitting: Neurology

## 2024-05-17 ENCOUNTER — Telehealth: Payer: Self-pay | Admitting: Neurology

## 2024-05-17 DIAGNOSIS — G309 Alzheimer's disease, unspecified: Secondary | ICD-10-CM

## 2024-05-17 DIAGNOSIS — F03918 Unspecified dementia, unspecified severity, with other behavioral disturbance: Secondary | ICD-10-CM

## 2024-05-17 DIAGNOSIS — F22 Delusional disorders: Secondary | ICD-10-CM

## 2024-05-17 DIAGNOSIS — Z820 Family history of epilepsy and other diseases of the nervous system: Secondary | ICD-10-CM

## 2024-05-17 NOTE — Telephone Encounter (Signed)
 Patient had incorrect medicare ID # listed Provided correct medicare # to [facility] billing

## 2024-05-17 NOTE — Telephone Encounter (Signed)
 Patient and husband Cindy [name] informed.

## 2024-05-17 NOTE — Telephone Encounter (Signed)
 Spoke with [facility] and confirmed that patient will just need to pick a provider she would like to see and will need to call [facility] to request the change.

## 2024-05-17 NOTE — Telephone Encounter (Signed)
 Per Angie, Dr Dohmeier agrees to see the patient.

## 2024-05-17 NOTE — Telephone Encounter (Signed)
 [facility] has resubmitted the claim - the turn around time is 30-45 days-  States patient can disregard the bill.

## 2024-05-17 NOTE — Progress Notes (Signed)
 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    She had a fall on concrete this March but had no injuries, and had another near fall in April .   Sundowning ? According to husband  she gets restless and somewhat agitated by late afternoon. Very repetitive in conversation.  First noted name recall difficulties before her 50 B day,  Had trouble to calculate the tip in a Restaurant in March 2023,  Recently she arrived 2 days earlier than scheduled for a family event, having mixed up dates.   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  Executive function  needs to be further tested.  Patient and husband want to know if her dx is AD as they have interest in advanced treatment options but yet none have been discussed - treatments to target the AD disorder more aggressively .   Aricept  to be reinstated- if tolerated, if not, will use patch.  Fam Hx: Nephew with Kinefelter;s , age 66. Great niece died of metastatic melanoma age 27,  Brother had  early AD onset in his late 38s and died after many years in a memory facility,  yet 2 older sisters are living and are cognitively well.

## 2024-05-17 NOTE — Telephone Encounter (Signed)
 Ok with me

## 2024-05-17 NOTE — Telephone Encounter (Signed)
 Yes, I can see here Friday at ( AM if you can make that happen for me ? And  I want the husband to be in the room with her for history taking , TY. CD

## 2024-05-17 NOTE — Telephone Encounter (Signed)
 Called the patient back and advised I am able to get her in on fri 6/13 at 11:30 am checking in 11 am. Pt verbalized understanding.

## 2024-05-22 ENCOUNTER — Telehealth: Payer: Self-pay | Admitting: Neurology

## 2024-05-22 NOTE — Telephone Encounter (Signed)
 no auth required sent to GI [phone]

## 2024-05-26 ENCOUNTER — Other Ambulatory Visit: Payer: Self-pay | Admitting: Neurology

## 2024-05-26 ENCOUNTER — Encounter: Payer: Self-pay | Admitting: Neurology

## 2024-05-26 ENCOUNTER — Ambulatory Visit (INDEPENDENT_AMBULATORY_CARE_PROVIDER_SITE_OTHER): Admitting: Neurology

## 2024-05-26 VITALS — BP 140/80 | HR 76 | Ht 67.0 in | Wt 157.6 lb

## 2024-05-26 DIAGNOSIS — R413 Other amnesia: Secondary | ICD-10-CM | POA: Insufficient documentation

## 2024-05-26 DIAGNOSIS — G319 Degenerative disease of nervous system, unspecified: Secondary | ICD-10-CM | POA: Insufficient documentation

## 2024-05-26 DIAGNOSIS — G309 Alzheimer's disease, unspecified: Secondary | ICD-10-CM | POA: Insufficient documentation

## 2024-05-26 DIAGNOSIS — F0280 Dementia in other diseases classified elsewhere without behavioral disturbance: Secondary | ICD-10-CM | POA: Insufficient documentation

## 2024-05-26 DIAGNOSIS — Z820 Family history of epilepsy and other diseases of the nervous system: Secondary | ICD-10-CM | POA: Diagnosis not present

## 2024-05-26 DIAGNOSIS — F03918 Unspecified dementia, unspecified severity, with other behavioral disturbance: Secondary | ICD-10-CM | POA: Diagnosis not present

## 2024-05-26 DIAGNOSIS — F0390 Unspecified dementia without behavioral disturbance: Secondary | ICD-10-CM

## 2024-05-26 MED ORDER — DONEPEZIL HCL 5 MG PO TABS: 5.0000 mg | ORAL_TABLET | Freq: Every day | ORAL | 0 refills | Status: DC

## 2024-05-26 MED ORDER — DONEPEZIL HCL 10 MG PO TABS: 10.0000 mg | ORAL_TABLET | Freq: Every day | ORAL | 1 refills | Status: DC

## 2024-05-26 MED ORDER — MEMANTINE HCL 5 MG PO TABS
5.0000 mg | ORAL_TABLET | Freq: Two times a day (BID) | ORAL | 5 refills | Status: DC
Start: 2024-05-26 — End: 2024-06-23

## 2024-05-26 MED ORDER — MIRTAZAPINE 7.5 MG PO TABS: 7.5000 mg | ORAL_TABLET | Freq: Every day | ORAL | 2 refills | Status: DC

## 2024-05-26 NOTE — Progress Notes (Signed)
 Namenda  and Aricept  can be taken together- they do not exclude from another. They also can be continued should IV therapies start.

## 2024-05-26 NOTE — Addendum Note (Signed)
 Addended by: Neomia Banner on: 05/26/2024 12:44 PM   Modules accepted: Orders

## 2024-05-26 NOTE — Patient Instructions (Addendum)
 Donepezil  Tablets What is this medication?   Start at 5 mg for 30 days in PM and  after 30 days , take 10 mg daily - '  ARICEPT     DONEPEZIL  (doe NEP e zil) treats memory loss and confusion (dementia) in people who have Alzheimer disease. It works by improving attention, memory, and the ability to engage in daily activities. It is not a cure for dementia or Alzheimer disease. This medicine may be used for other purposes; ask your health care provider or pharmacist if you have questions. COMMON BRAND NAME(S): Aricept  What should I tell my care team before I take this medication? They need to know if you have any of these conditions: Head injury Heart disease Irregular heartbeat or rhythm Liver disease Lung or breathing disease, such as asthma Seizures Stomach ulcers, other stomach or intestine problems Stomach bleeding Trouble passing urine An unusual or allergic reaction to donepezil , other medications, foods, dyes, or preservatives Pregnant or trying to get pregnant Breastfeeding How should I use this medication? Take this medication by mouth with a glass of water. Follow the directions on the prescription label. You may take this medication with or without food. Take this medication at regular intervals. This medication is usually taken before bedtime. Do not take it more often than directed. Continue to take your medication even if you feel better. Do not stop taking except on your care team's advice. If you are taking the 23 mg donepezil  tablet, swallow it whole; do not cut, crush, or chew it. Talk to your care team about the use of this medication in children. Special care may be needed. Overdosage: If you think you have taken too much of this medicine contact a poison control center or emergency room at once. NOTE: This medicine is only for you. Do not share this medicine with others. What if I miss a dose? If you miss a dose, take it as soon as you can. If it is almost time  for your next dose, take only that dose, do not take double or extra doses. What may interact with this medication? Do not take this medication with any of the following: Certain medications for fungal infections, such as itraconazole, fluconazole , posaconazole, voriconazole Cisapride Dextromethorphan; quinidine Dronedarone Pimozide Quinidine Thioridazine This medication may also interact with the following: Antihistamines for allergy, cough, and cold Atropine Bethanechol Carbamazepine Certain medications for bladder problems, such as oxybutynin or tolterodine Certain medications for Parkinson disease, such as benztropine or trihexyphenidyl Certain medications for stomach problems, such as dicyclomine  or hyoscyamine Certain medications for travel sickness, such as scopolamine Dexamethasone  Dofetilide Ipratropium NSAIDs, medications for pain and inflammation, such as ibuprofen  or naproxen Other medications for Alzheimer disease Other medications that cause heart rhythm changes Phenobarbital Phenytoin Rifampin, rifabutin, or rifapentine Ziprasidone This list may not describe all possible interactions. Give your health care provider a list of all the medicines, herbs, non-prescription drugs, or dietary supplements you use. Also tell them if you smoke, drink alcohol, or use illegal drugs. Some items may interact with your medicine. What should I watch for while using this medication? Visit your care team for regular checks on your progress. Tell your care team if your symptoms do not start to get better or if they get worse. This medication may affect your coordination, reaction time, or judgment. Do not drive or operate machinery until you know how this medication affects you. Sit up or stand slowly to reduce the risk of dizzy or fainting spells. Drinking  alcohol with this medication can increase the risk of these side effects. What side effects may I notice from receiving this  medication? Side effects that you should report to your care team as soon as possible: Allergic reactions--skin rash, itching, hives, swelling of the face, lips, tongue, or throat Peptic ulcer--burning stomach pain, loss of appetite, bloating, burping, heartburn, nausea, vomiting Seizures Slow heartbeat--dizziness, feeling faint or lightheaded, confusion, trouble breathing, unusual weakness or fatigue Stomach bleeding--bloody or black, tar-like stools, vomiting blood or brown material that looks like coffee grounds Trouble passing urine Side effects that usually do not require medical attention (report these to your care team if they continue or are bothersome): Diarrhea Fatigue Loss of appetite Muscle pain or cramps Nausea Trouble sleeping This list may not describe all possible side effects. Call your doctor for medical advice about side effects. You may report side effects to FDA at 1-800-FDA-1088. Where should I keep my medication? Keep out of reach of children. Store at room temperature between 15 and 30 degrees C (59 and 86 degrees F). Throw away any unused medication after the expiration date. NOTE: This sheet is a summary. It may not cover all possible information. If you have questions about this medicine, talk to your doctor, pharmacist, or health care provider.  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    2)   AD BIO- marker panel. Alzheimer's disease risk factors.    MOCA 18/ 30 but MMSE 23/ 30- enough to consider  MAB-  therapy.        3) MRI brain  12- July -2025.06/24/2024  2:20 PM at [facility] AT [address]     If positive  AD panel, will follow  with PET amyloid brain scan.      I plan to follow up  either personally or through our NP within 2-3. months.

## 2024-05-26 NOTE — Progress Notes (Addendum)
 SLEEP MEDICINE CLINIC    Provider:  Neomia Banner, MD  Primary Care Physician:  Adela Holter, DO [address] [facility] [address] [place] [address]     Referring Provider: Adela Holter, Do [address] [place],  [place] [address]          Chief Complaint according to patient   Patient presents with:     New Patient (Initial Visit)           HISTORY OF PRESENT ILLNESS:  Cindy [name] is a 73 y.o. female patient who is seen upon referral on 05/26/2024 from Dr Augustus Ledger and Family members,  for a New Look as memory.   .  Chief concern according to patient :  Progressive Memory Loss.     I have the pleasure of seeing Cindy [name] 05/26/24 a right -handed female with a MBA , BS RN, Masters in health business administration,.  Progression over the last 12 months ,but notable MCI started 3 years ago ;some little blips,  repetitive comments or  repeated questions,  Problems with calculating a tip in a restaurant were noted in March 2023.   Had none of these difficulties at her B day January 2024,  fluent conversation,  drove to venue. In September 2024,she had tried to pay the bill for a table of 4 that was already paid  -(a tap had been started for the table, so it wasn't as if the bill would have been brought to the table ).   The patient used to drive even at night until 12 months ago , safely. No accidents, using navigation system, safely able to park. Has according to her husband never gotten lost.   She had 2 falls alone in Spring this year,  her overall movement have slowed a little, her posture is less erect.  She denied a head injury.  She drives in daytime to  church, markets and to  local vendors.  Had some confusion about  a social appointment when she went to the locality 2 days before the ceremony took place, and on the day of the ceremony had some trouble to find words during conversation.   Very recently, Cindy [name], had  been confused in the late afternoon- she was concerned about the car parked in the couple's long time home's driveway - which she didn't recognize it as her own [place].   Her husband had to show her the purchase contract from over 5 years ago .  He also felt /reported she didn't recognize him while both were at home one night, she felt he was an Hospital doctor / ?    Has been more easily confused about events, appointments, and one of her social media apps went dormant. She has not logged in since March 2025  and seems not to be able to reactivate her account .   She repeatedly ask the same questions during small talk but was neither anxious nor concerned.      Family medical /sleep history: brother died of AD, onset in this age of 3s.   2 older sisters are now also physically disabled, but not  cognitively impaired. One nieve committed suicide, a grandniece Set designer, a former Charity fundraiser at [employer] ) died of metastatic melanoma , leaving 3 young children behind that the [facility] had regularly watched and housed during their mother's long illness , hospice stay and after her death, Vaughn Georges eldest daughter has  DM type 1. Nandita took additional pediatric diabetic courses and nursed the infant until she received an insulin pump.  With the recent remarriage of the widower, there came a painful separation from the three children who have moved with step mother and stepsisters to a location much further away..  Sister Polly's only living child , her nephew Baker Bon , suffers from Klinefelters syndrome.   Social history:  Patient is retired from Civil engineer, contracting, a Recruitment consultant, [employer]  graduate , and lives in a household with her husband, Cindy [name].  Her husband brought two young teenage sons into the marriage over 30 years ago of whom one is deceased at age  ( 41?) .   One step grandson has worked for a Lobbyist , lives near [place], [place],, and is in graduate school,   engaged.     She sleeps through the night ,  Apetite has recently increased, ( Remeron ?)  circa over the last 4 months, she enjoys food, rarely drinks alcohol ( 2 glasses a months or less) and never smoked,   She eats regularly  a full breakfast, with coffee and cream,   also 2 cups of applesauce daily as snacks.  Lunch and dinner are full meals, also.    Routine for meals , bedtimes,  attend the same church, both love music and  a re regular at choir practice.  The couple lives in a home that they constructed on land that used to be the [place] family [place], the 4 children received plots of land to built on ,  her brother continued farming , now  maintained and continued by her nephew.  She used to live close to her older siblings, but with advancing age these have  moved to senior residences themselves.          Review of Systems: Out of a complete 14 system review, the patient complains of only the following symptoms, and all other reviewed systems are negative.:     Social History   Socioeconomic History   Marital status: Married    Spouse name: Cindy [name]   Number of children: 0   Years of education: 18   Highest education level: Master's degree (e.g., MA, MS, MEng, MEd, MSW, MBA)  Occupational History   Occupation: Retired.   Occupation: retired  Tobacco Use   Smoking status: Never   Smokeless tobacco: Never  Vaping Use   Vaping status: Never Used  Substance and Sexual Activity   Alcohol use: Not Currently    Comment: rarely   Drug use: No   Sexual activity: Yes    Birth control/protection: Post-menopausal  Other Topics Concern   Not on file  Social History Narrative   Lives with her husband. They help take care of her brother's grandchildren. She enjoys singing in the choir and gardening.   Social Drivers of Corporate investment banker Strain: Low Risk  (12/28/2022)   Overall Financial Resource Strain (CARDIA)    Difficulty of Paying Living Expenses: Not hard at all   Food Insecurity: No Food Insecurity (11/10/2023)   Hunger Vital Sign    Worried About Running Out of Food in the Last Year: Never true    Ran Out of Food in the Last Year: Never true  Transportation Needs: No Transportation Needs (11/10/2023)   PRAPARE - Administrator, Civil Service (Medical): No    Lack of Transportation (Non-Medical): No  Physical Activity: Inactive (12/28/2022)  Exercise Vital Sign    Days of Exercise per Week: 0 days    Minutes of Exercise per Session: 0 min  Stress: No Stress Concern Present (12/28/2022)   [employer] of Occupational Health - Occupational Stress Questionnaire    Feeling of Stress : Not at all  Social Connections: Socially Integrated (12/28/2022)   Social Connection and Isolation Panel    Frequency of Communication with Friends and Family: More than three times a week    Frequency of Social Gatherings with Friends and Family: More than three times a week    Attends Religious Services: More than 4 times per year    Active Member of [employer] or Organizations: Yes    Attends Engineer, structural: More than 4 times per year    Marital Status: Married    Family History  Problem Relation Age of Onset   Cancer Mother        bone   Hypertension Father    Diabetes Father    Stroke Father    Hypertension Sister    Hypertension Sister    Alzheimer's disease Brother    Colon cancer Neg Hx    Esophageal cancer Neg Hx    Rectal cancer Neg Hx    Stomach cancer Neg Hx     Past Medical History:  Diagnosis Date   Arthritis    Cancer (HCC)    breast   Depression    GERD (gastroesophageal reflux disease)    Hyperlipidemia    Tendonitis     Past Surgical History:  Procedure Laterality Date   BREAST SURGERY  2000   right   ELBOW SURGERY     right     Current Outpatient Medications on File Prior to Visit  Medication Sig Dispense Refill   Calcium Carbonate-Vit D-Min (CALCIUM 1200 PO) Take 1 tablet by mouth daily.      clonazePAM  (KLONOPIN ) 1 MG tablet Take 1 tablet (1 mg total) by mouth 2 (two) times daily as needed. 60 tablet 1   lovastatin  (MEVACOR ) 20 MG tablet Take 20 mg by mouth at bedtime.     Multiple Vitamin (MULTIVITAMIN) tablet Take 1 tablet by mouth daily.     ondansetron  (ZOFRAN -ODT) 4 MG disintegrating tablet Take 1 tablet (4 mg total) by mouth every 8 (eight) hours as needed for nausea or vomiting. 30 tablet 0   PARoxetine  (PAXIL ) 20 MG tablet Take 2 tablets (40 mg total) by mouth daily. (Patient taking differently: Take 20 mg by mouth daily.) 180 tablet 3   Rimegepant Sulfate (NURTEC) 75 MG TBDP Take 1 tablet (75 mg total) by mouth daily as needed (migraine). 30 tablet 1   SUMAtriptan  (IMITREX ) 25 MG tablet Take 1 tablet (25 mg total) by mouth every 2 (two) hours as needed for migraine. May repeat in 2 hours if headache persists or recurs. 10 tablet 2   trimethoprim  (TRIMPEX ) 100 MG tablet TAKE 1 TABLET EVERY DAY 90 tablet 3   famotidine  (PEPCID ) 20 MG tablet TAKE 1 TABLET BY MOUTH TWICE A DAY (Patient not taking: Reported on 05/26/2024) 180 tablet 1   No current facility-administered medications on file prior to visit.    No Known Allergies   DIAGNOSTIC DATA (LABS, IMAGING, TESTING) - I reviewed patient records, labs, notes, testing and imaging myself where available.  Lab Results  Component Value Date   WBC 6.1 01/03/2024   HGB 11.1 01/03/2024   HCT 35.1 01/03/2024   MCV 83 01/03/2024   PLT 284  01/03/2024      Component Value Date/Time   NA 142 01/03/2024 1457   K 4.1 01/03/2024 1457   CL 104 01/03/2024 1457   CO2 24 01/03/2024 1457   GLUCOSE 76 01/03/2024 1457   GLUCOSE 94 12/27/2023 1130   BUN 5 (L) 01/03/2024 1457   CREATININE 0.78 01/03/2024 1457   CREATININE 1.02 12/16/2022 1153   CALCIUM 9.3 01/03/2024 1457   PROT 6.4 01/03/2024 1457   ALBUMIN 3.9 01/03/2024 1457   AST 15 01/03/2024 1457   ALT 12 01/03/2024 1457   ALKPHOS 118 01/03/2024 1457   BILITOT 0.3 01/03/2024  1457   GFRNONAA >60 12/27/2023 1130   GFRNONAA 65 11/22/2020 1116   GFRAA 76 11/22/2020 1116   Lab Results  Component Value Date   CHOL 198 12/23/2023   HDL 61 12/23/2023   LDLCALC 122 (H) 12/23/2023   LDLDIRECT 126.4 12/18/2013   TRIG 82 12/23/2023   CHOLHDL 2.4 12/16/2022   Lab Results  Component Value Date   HGBA1C 5.7 (H) 08/10/2022   Lab Results  Component Value Date   VITAMINB12 634 08/10/2022   Lab Results  Component Value Date   TSH 1.170 12/23/2023    PHYSICAL EXAM:  Today's Vitals   05/26/24 1132  BP: (!) 140/80  Pulse: 76  Weight: 157 lb 9.6 oz (71.5 kg)  Height: 5' 7 (1.702 m)   Body mass index is 24.68 kg/m.   Wt Readings from Last 3 Encounters:  05/26/24 157 lb 9.6 oz (71.5 kg)  04/20/24 154 lb (69.9 kg)  01/26/24 142 lb (64.4 kg)     Ht Readings from Last 3 Encounters:  05/26/24 5' 7 (1.702 m)  04/20/24 5' 7.5 (1.715 m)  01/26/24 5' 8 (1.727 m)      General: The patient is awake, alert and appears not in acute distress. The patient is well groomed. Head: Normocephalic, atraumatic. Neck is supple.  Retrognathia is not seen.  Dental status: biological  Cardiovascular:  Regular rate and cardiac rhythm by pulse,  without distended neck veins. Respiratory: Lungs are clear to auscultation.  Skin:  Without evidence of ankle edema, or rash. Trunk: The patient's posture is erect.   NEUROLOGIC EXAM: The patient is awake and alert, oriented to place and time.   Memory subjective described as impaired      05/26/2024   11:47 AM  Montreal Cognitive Assessment   Visuospatial/ Executive (0/5) 3  Naming (0/3) 3  Attention: Read list of digits (0/2) 2  Attention: Read list of letters (0/1) 0  Attention: Serial 7 subtraction starting at 100 (0/3) 2  Language: Repeat phrase (0/2) 0  Language : Fluency (0/1) 1  Abstraction (0/2) 1  Delayed Recall (0/5) 0  Orientation (0/6) 6  Total 18      04/20/2024    8:50 AM 02/10/2023    7:30 AM  08/10/2022    8:14 AM  MMSE - Mini Mental State Exam  Orientation to time 2 5 5   Orientation to Place 5 5 5   Registration 3 3 3   Attention/ Calculation 4 3 4   Recall 0 2 3  Language- name 2 objects 2 2 2   Language- repeat 1 1 1   Language- follow 3 step command 3 3 3   Language- read & follow direction 1 1 1   Write a sentence 1 1 1   Copy design 1 1 1   Total score 23 27 29      Attention span & concentration ability appears impaired, Cindy [name] is  pleasant, well groomed and cooperative.  Speech is fluent,  without  dysarthria, dysphonia or aphasia.  Mood and affect are appropriate.   Cranial nerves: no loss of smell or taste reported  Pupils are equal and briskly reactive to light. Funduscopic exam deferred .  Extraocular movements in vertical and horizontal planes were intact and without nystagmus. No Diplopia. Visual fields by finger perimetry are intact. Hearing was intact to soft voice and finger rubbing.    Facial sensation intact to fine touch.  Facial motor strength is symmetric and tongue and uvula move midline.  Neck ROM : rotation, tilt and flexion extension were normal for age and shoulder shrug was symmetrical.    Motor exam:  Symmetric bulk, tone and ROM.   Normal tone without cog wheeling, symmetric grip strength .   Sensory:  Fine touch, pinprick and vibration were tested  and  normal.  Proprioception tested in the upper extremities was normal.   Coordination: Rapid alternating movements in the fingers/hands were of normal speed.  The Finger-to-nose maneuver was intact without evidence of ataxia, dysmetria or tremor.   Gait and station: Patient could rise unassisted from a seated position, walked without assistive device.  Stance is of normal width/ base and the patient turned with 3 steps.  Toe and heel walk were deferred.  Deep tendon reflexes: in the  upper and lower extremities are symmetric and intact.  Babinski response was deferred.    ASSESSMENT AND PLAN 73  y.o. year old female MBA  here with:    1) Strong family dementia  history, her only brother had been born 60, he died  06/23/11, at the time 12 years in  institutional care,  onset of presumed AD at age 30 plus.   2)    MOCA 18/ 30 but MMSE 23/ 30- at this time just  high enough score to  be considered for Lequembi  MAB- iv therapy.    Plan : AD BIO- marker panel. Alzheimer's disease risk factors. E4 testing late onset, and early onset risk factors. Full GNA dementia panel ,   3) MRI brain with/ without  12- July -2025.06/24/2024  2:20 PM at [facility] AT [address]  .  This patient is a breast cancer survivor.  Rule out vascular dementia.  Patient had a fall in March and in April , rule out asymptomatic  TBI .   If positive AD biomarker ( and/ or genetic  panel), will follow with PET amyloid brain scan.    I plan to follow up either personally or through our NP within 2-3. months.  I do like to repeat an MMSE  at that time.   I would like to thank Adela Holter, DO for allowing me to meet with and to take care of this pleasant patient.    After spending a total time of  45  minutes face to face and additional time for physical and neurologic examination, review of laboratory studies,  personal review of imaging studies, reports and results of other testing and review of referral information / records as far as provided in visit,   Electronically signed by: Neomia Banner, MD 05/26/2024 4:54 PM  [facility] Board certified by The [employer] of Sleep Medicine and Diplomate of the [employer] of Sleep Medicine. Board certified In Neurology through the ABPN, Fellow of the [employer] of Neurology.

## 2024-05-29 ENCOUNTER — Telehealth: Payer: Self-pay | Admitting: Neurology

## 2024-05-29 NOTE — Telephone Encounter (Signed)
Referral for neuropsychology fax to [facility]. Phone: [phone], Fax: [phone].

## 2024-05-30 ENCOUNTER — Other Ambulatory Visit (INDEPENDENT_AMBULATORY_CARE_PROVIDER_SITE_OTHER): Payer: Self-pay

## 2024-05-30 DIAGNOSIS — Z820 Family history of epilepsy and other diseases of the nervous system: Secondary | ICD-10-CM

## 2024-05-30 DIAGNOSIS — Z0289 Encounter for other administrative examinations: Secondary | ICD-10-CM

## 2024-05-30 DIAGNOSIS — F22 Delusional disorders: Secondary | ICD-10-CM

## 2024-05-30 DIAGNOSIS — F03918 Unspecified dementia, unspecified severity, with other behavioral disturbance: Secondary | ICD-10-CM

## 2024-06-02 LAB — ATN PROFILE

## 2024-06-02 LAB — APOE ALZHEIMER'S RISK

## 2024-06-09 LAB — B12 AND FOLATE PANEL
Folate: 17.5 ng/mL (ref >3.0)
Vitamin B-12: 497 pg/mL (ref 232–1245)

## 2024-06-09 LAB — APOE ALZHEIMER'S RISK

## 2024-06-09 LAB — ATN PROFILE
A -- Beta-amyloid 42/40 Ratio: 0.094 — ABNORMAL LOW (ref >0.102)
Beta-amyloid 40: 206.08 pg/mL
Beta-amyloid 42: 19.31 pg/mL
N -- NfL, Plasma: 2.77 pg/mL (ref 0.00–6.04)
T -- p-tau181: 2.28 pg/mL — ABNORMAL HIGH (ref 0.00–0.97)

## 2024-06-14 ENCOUNTER — Ambulatory Visit: Payer: Self-pay | Admitting: Neurology

## 2024-06-14 DIAGNOSIS — Z820 Family history of epilepsy and other diseases of the nervous system: Secondary | ICD-10-CM

## 2024-06-14 DIAGNOSIS — F0390 Unspecified dementia without behavioral disturbance: Secondary | ICD-10-CM

## 2024-06-14 DIAGNOSIS — R413 Other amnesia: Secondary | ICD-10-CM

## 2024-06-15 ENCOUNTER — Encounter: Payer: Self-pay | Admitting: Psychology

## 2024-06-15 ENCOUNTER — Encounter: Payer: BLUE CROSS/BLUE SHIELD | Attending: Psychology | Admitting: Psychology

## 2024-06-15 DIAGNOSIS — G309 Alzheimer's disease, unspecified: Secondary | ICD-10-CM | POA: Insufficient documentation

## 2024-06-15 DIAGNOSIS — F0280 Dementia in other diseases classified elsewhere without behavioral disturbance: Secondary | ICD-10-CM

## 2024-06-15 DIAGNOSIS — F418 Other specified anxiety disorders: Secondary | ICD-10-CM | POA: Insufficient documentation

## 2024-06-15 DIAGNOSIS — F02A0 Dementia in other diseases classified elsewhere, mild, without behavioral disturbance, psychotic disturbance, mood disturbance, and anxiety: Secondary | ICD-10-CM | POA: Insufficient documentation

## 2024-06-15 DIAGNOSIS — R413 Other amnesia: Secondary | ICD-10-CM | POA: Insufficient documentation

## 2024-06-15 DIAGNOSIS — R4789 Other speech disturbances: Secondary | ICD-10-CM | POA: Insufficient documentation

## 2024-06-15 NOTE — Progress Notes (Signed)
 Neuropsychological Evaluation   Patient:  Cindy [name]   DOB: 06-30-1951  MR Number: [id number]  Location: [facility] [address] [place], [address] [place] [address] Dept: [phone]  Start: 8 AM End: 9 AM  Provider/Observer:     Norleen JONELLE Asa PsyD  Chief Complaint:      Chief Complaint  Patient presents with   Memory Loss   Anxiety   Depression   Other    Word finding difficulties    Reason For Service:      Cindy [name] is a 73 year old female referred for neuropsychological evaluation by her treating neurologist True Mar, MD and she has completed the initial or first neuropsychological evaluation at the end of 2023.  This is a follow-up appointment to consider repeat testing if warranted.  I have included portions of the previous neuropsychological evaluation below for convenience and her full neuropsych evaluation can be found in the patient's EMR dated 11/26/2022.   During most recent clinical interview in 2024, the patient reports that she feels like she is doing better than she was the previous year.  The patient reports that she was started on Aricept  to see if it would help any.  Patient began developing severe vivid nightmares that triggered her to stop taking the medication.  She coordinated with her treating neurologist and the medication was changed from taking it at night to daytime and the nightmares have stopped.  Patient reports that she did not notice any particular improvement in her memory or worsening of her memory when she discontinued the Aricept  but she is now taking it again without any apparent side effect.  Patient reports that she feels like her memory is better than it was when I saw her 1 year ago but she does continue to have significant memory difficulties.  The patient reports that her mood is good.  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  The patient reports that they did note changes in her memory from what they were familiar with her capacity in the past.  Patient does acknowledge ongoing memory difficulties but does not feel like they are worse.  Patient denies any geographic disorientation and denies any changes in reasoning or problem-solving or visual spatial/visual constructional changes.  The patient reports that she is still doing fine when she is cooking or driving or other independent activities of daily living.  The patient reports that she still has difficulty remembering issues with semantic memory greater than episodic memory.   During follow-up clinical interview, the patient did repeat several comments and descriptions that she had said almost word for word earlier in the clinical interview.  It was rather unclear whether she was aware that she had already made the statements are not but she did tend to repeat herself and descriptions of what were going on.  The patient had a very positive affect with good mood state and was engaged and alert throughout.  Patient did appear oriented x 4.  I did not do any screening of memory functions during today's visit as we were setting her up for formal repeat testing to be done very soon.  The patient is aware of significant memory changes but recent mental status exams do not show significant or profound difficulties.  We discussed the benefits and drawbacks of doing repeat testing in the patient agreed to  and wanted to repeat testing for direct comparisons to her performances last year.   New laboratory results since start of testing procedures:  ATN profile: The patient demonstrated low beta amyloid 42/40 and high P tau 181 concentrations which are patterns consistent with the presence of Alzheimer's related pathology.  The patient had genetic testing including APOE Alzheimer's risk profile demonstrating 1 copy of the  APOE4 variant that does indicate increased risk for late onset Alzheimer's type pathology. Again, below you will find the complete clinical history that was derived during the first evaluation back from 11/23 with new information above from the recent neuropsychological clinical interview.     Initial clinical interview from 2023:  Cindy [name] is a 73 year old female referred for neuropsychological evaluation by her treating neurologist True Mar, MD due to increasing memory difficulties and memory loss with the family history of dementia and one of her siblings was diagnosed in the sibling's early 26s.  Patient has a past history of depression that developed after failed marriage in the late 1980s and was also exacerbated after a diagnosis and treatment for breast cancer in 1999.  Patient has a past medical history including reflux disease, hyperlipidemia, breast cancer, arthritis, anxiety, depression, overweight status.  Patient has been describing memory difficulties and other cognitive issues for a little over a year now.  Primary memory issues have to do with difficulty remembering names of people and objects particular people that she has known for years.  Patient has also described with issues related to concentration and attention.   During the clinical interview today, the patient reports that she really started noticing significant memory issues over the past 8 to 10 months and that she will see someone that she has done very well and cannot recall their name.  She does report that this tends to clear up and improve at times.  The patient is concerned about these changes as she has a brother with a history of diagnosis of Alzheimer's dementia who passed away at age 23.  He was diagnosed initially in his early 41s.  He spent 7 years in a nursing home ultimately dying of pneumonia and Alzheimer symptoms.  The patient is the youngest of 4 children with her oldest sister being 29 years old and  living in a retirement community and her second oldest sister is 63 years old with arthritis but no cognitive issues.   The patient denies any visual hallucinations, tremors or other big/major medical issues.  Current medications include Wellbutrin  and Paxil .  Patient reports that she loves to sleep and also take some naps during the day.  The patient does not have any symptoms that would suggest obstructive sleep apnea or other pulmonary issues.   The patient has had significant stressors over the past several years with one of them being a great niece who she was very close to passing away from melanoma.  The patient also was close to the niece's young children and the patient has had less contact the niece's 3 young children after her great niece passed away.  The patient reports that her other psychosocial stressors within the family as well as the patient and her husband having to take care of some aspects with her children.  She describes her husband as very helpful and he is often encouraging her to do things to help with her depression.   Patient had a brain MRI with and without contrast performed on 08/27/2022 and interpreted by Charlie Crete, MD. impressions  from this MRI showed that brain volume was normal for age and was compared to a 2017 CT scan suggesting some mild atrophy.  T2/FLAIR hyperintense foci in the subcortical and deep white matter and subcortical regions, parietal lobes and more in the frontal lobes.  There was no acute findings.     Impression/Diagnosis:                     The results of the current neuropsychological evaluation taking into account the patient's subjective reports, available medical information as well as objective neuropsychological assessment do show some consistencies between 1 another for the most part.  However, some of the elements of objective assessment are well outside and more impaired than would be explained by direct clinical observation as well as  subjective reports.  The patient is describing more memory difficulties over the past 8 to 10 months triggering her concern as there is a family history of family members being diagnosed with Alzheimer's dementia.  The patient does not describe a particularly long or steady deterioration in cognitive functioning.  There are no indications of significant cerebrovascular disease and she is showing some age-appropriate small vessel changes but nothing severe.  The primary and most causative deficit noted on cognitive testing was related to significant and severe auditory encoding capacity and to a lesser degree visual encoding capacity.  The patient appears to be maintaining information processing speed as well as maintaining verbal fluency and expressive fluency particularly for lexical fluency.  The patient does describe some targeted naming issues and she did have some mild weakness for semantic fluency noted.  Patient denies any tremors, visual hallucinations or deficits with regard to completing basic ADLs and IADLs.  The level of memory impairments derived during objective neuropsychological assessment would be inconsistent with those real world variables.   As far as diagnostic considerations I strongly suspect that much of the patient's cognitive difficulties are related to functional issues and stressors related to depression, anxiety and psychosocial stressors.  The patient does appear to have objective findings of changes in cognitive functioning particularly with regard to memory but the observed objective assessment of memory would be far outside of those predicted from direct clinical observation, the patient's subjective reports and descriptions of her functioning etc.  I also suspect that the objective assessment was particularly stressful for the patient further exacerbating attentional components.  The patient does acknowledge significant changes in attention and concentration.  The pattern of  cognitive strengths and weaknesses, subjective reports and clinical observations are not particularly consistent with those typically seen with a progressive dementia such as Alzheimer's or Lewy body.  I suspect that depression/anxiety are playing a bigger role and the clinical changes in the patient over the past year also lining up with timelines of significant stress the patient has experienced.  While these may be coincidental in timing and clouding the ability to get a specific assessment I do think that the most likely an appropriate diagnosis at this point would not be of a progressive degenerative condition such as Alzheimer's but 1 of a mild cognitive disorder primarily memory impairment that is being exacerbated by depression/anxiety symptoms.  The depression and anxiety are having a significant impact on attention and concentration which is directly and indirectly impacting all other cognitive domains.   The patient is currently being treated for depression and anxiety with psychotropic interventions.  Bupropion , clonazepam , paroxetine  are all part of her psychotropic interventions.  The patient does describe excessive  sleeping and this may be playing a role as well.  Patient denies any symptoms consistent with obstructive sleep apnea.  As these psychological variables are complicating direct interpretation it would be appropriate to readminister these measures in approximately 9 months to assess for any progressive changes over time.  But in any event, her pattern does not indicate symptoms that I would typically associate with a dementia of the Alzheimer's type or Lewy body dementia etc. but there may be a combination of factors at play but depression and anxiety and significant impairments of attention and concentration are the most likely culprits.   The patient has been started on Aricept .  Today, she reports that she did have an increase in very vivid nightmares that triggered her to stop taking  the medication.  Patient consulted with her treating neurologist Dr. Buck who suggested that she begin taking it in the morning rather than the evening and the nightmares have stopped.  Patient reported today that she did not notice any worsening of her memory when she discontinued the medication.  She is having no other side effects.   Tests Administered: Controlled Oral Word Association Test (COWAT; FAS & Animals)  Wechsler Adult Intelligence Scale, 4th Edition (WAIS-IV) Wechsler Memory Scale, 4th Edition (WMS-IV); Older Adult Battery   Participation Level:   Active  Participation Quality:  Appropriate      Behavioral Observation:  The patient appeared well-groomed and appropriately dressed. Her manners were polite and appropriate to the situation. The patient's attitude towards testing was positive and her effort was good.   Her Well Groomed, Alert, and Appropriate.   Test Results:   Review of issues related to validity of both the initial and current neuropsychological assessment strongly suggest that this is a valid assessment.  Embedded validity checks were all appropriate in their findings with no indication of attempts to either purposefully or subconsciously manipulate or alter performances.  This does appear to be a fair and valid assessment of the current neuropsychological status with this patient.   Initially, an estimation was made as to the patient's historic/premorbid intellectual and cognitive functioning.  The patient completed her associates degree in nursing and then attended [employer] to obtain her BSN in nursing.  Patient was in the honor society.  Patient also attended [employer] to complete a dual masters program with an Ambulance person in Administrator, Civil Service.  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  It is  estimated that the patient performed in the high average to superior range of intellectual and cognitive functioning historically and we will utilize a conservative estimation standpoint of roughly 1 standard deviation above normative population for comparison purposes to current assessment measures.  2023 Test Results:   COWAT FAS Total = 43 Z = 0.08 Animals Total = 14 Z = -1   WAIS-IV                      Composite Score Summary              Scale Sum of Scaled Scores Composite Score Percentile Rank 95% Conf. Interval Qualitative Description  Verbal Comprehension 24 VCI 89 23 84-95 Low Average  Perceptual Reasoning 20 PRI 81 10 76-88 Low Average  Working Memory 12 WMI 77 6 72-85 Borderline  Processing Speed 18 PSI 94 34 86-103 Average  Full Scale 74  FSIQ 82 12 78-86 Low Average  General Ability 44 GAI 82 12 78-87 Low Average                    Verbal Comprehension Subtests Summary        Subtest Raw Score Scaled Score Percentile Rank Reference Group Scaled Score SEM  Similarities 15 6 9 5  1.04  Vocabulary 33 9 37 9 0.67  Information 13 9 37 10 0.73  (Comprehension) 20 8 25 8  1.08                      Perceptual Reasoning Subtests Summary          Subtest Raw Score Scaled Score Percentile Rank Reference Group Scaled Score SEM  Block Design 24 8 25 6  1.08  Matrix Reasoning 6 5 5 3  0.90  Visual Puzzles 7 7 16 5  0.85  (Figure Weights) 10 9 37 7 0.95  (Picture Completion) 9 8 25 7  1.16                         Working Surveyor, minerals Raw Score Scaled Score Percentile Rank Reference Group Scaled Score SEM  Digit Span 13 3 1 2  0.79  Arithmetic 12 9 37 9 0.99  (Letter-Number Seq.) 13 6 9 5  1.04                      Processing Speed Subtests Summary          Subtest Raw Score Scaled Score Percentile Rank Reference Group Scaled Score SEM  Symbol Search 24 9 37 7 1.31  Coding 51 9 37 6 0.99  (Cancellation) 27 7 16 6  1.34          WMS-IV                  Index Score Summary            Index Sum of Scaled Scores Index Score Percentile Rank 95% Confidence Interval Qualitative Descriptor  Auditory Memory (AMI) 22 74 4 69-82 Borderline  Visual Memory (VMI) 10 73 4 69-79 Borderline  Immediate Memory (IMI) 21 81 10 76-88 Low Average  Delayed Memory (DMI) 11 61 0.5 56-72 Extremely Low                  Primary Subtest Scaled Score Summary        Subtest Domain Raw Score Scaled Score Percentile Rank  Logical Memory I AM 30 9 37  Logical Memory II AM 9 6 9   Verbal Paired Associates I AM 9 6 9   Verbal Paired Associates II AM 0 1 0.1  Visual Reproduction I VM 24 6 9   Visual Reproduction II VM 4 4 2   Symbol Span VWM 14 8 25                   Auditory Memory Process Score Summary          Process Score Raw Score Scaled Score Percentile Rank Cumulative Percentage (Base Rate)  LM II Recognition 14 - - <=2%  VPA II Recognition 13 - - <=2%                      Visual Memory Process Score Summary          Process Score Raw Score Scaled Score Percentile  Rank Cumulative Percentage (Base Rate)  VR II Recognition 2 - - 3-9%     2024 Test Results:  COWAT:  FAS total= 30 Z= -0.38 Animals total= 19 Z= 0.73   WAIS-IV:            Composite Score Summary          Scale Sum of Scaled Scores Composite Score Percentile Rank 95% Conf. Interval Qualitative Description  Verbal Comprehension 24 VCI 89 23 84-95 Low Average  Perceptual Reasoning 24 PRI 88 21 82-95 Low Average  Working Memory 18 WMI 95 37 89-102 Average  Processing Speed 19 PSI 97 42 89-106 Average  Full Scale 85 FSIQ 89 23 85-93 Low Average  General Ability 48 GAI 87 19 82-92 Low Average              Verbal Comprehension Subtests Summary        Subtest Raw Score Scaled Score Percentile Rank Reference Group Scaled Score SEM  Similarities 14 6 9 5  0.95  Vocabulary 29 8 25 8  0.67  Information 15 10 50 11 0.73             Perceptual Reasoning  Subtests Summary        Subtest Raw Score Scaled Score Percentile Rank Reference Group Scaled Score SEM  Block Design 24 8 25 6  0.99  Matrix Reasoning 9 8 25 4  0.90  Visual Puzzles 8 8 25 6  0.99          Working Comptroller Raw Score Scaled Score Percentile Rank Reference Group Scaled Score SEM  Digit Span 23 9 37 7 0.73  Arithmetic 12 9 37 9 1.20          Processing Speed Subtests Summary        Subtest Raw Score Scaled Score Percentile Rank Reference Group Scaled Score SEM  Symbol Search 22 9 37 6 1.12  Coding 51 10 50 6 1.12    WMS-IV:            Index Score Summary        Index Sum of Scaled Scores Index Score Percentile Rank 95% Confidence Interval Qualitative Descriptor  Auditory Memory (AMI) 14 60 0.4 56-68 Extremely Low  Visual Memory (VMI) 8 66 1 62-72 Extremely Low  Immediate Memory (IMI) 15 69 2 64-77 Extremely Low  Delayed Memory (DMI) 7 51 0.1 47-63 Extremely Low           Primary Subtest Scaled Score Summary       Subtest Domain Raw Score Scaled Score Percentile Rank  Logical Memory I AM 20 6 9   Logical Memory II AM 6 5 5   Verbal Paired Associates I AM 2 2 0.4  Verbal Paired Associates II AM 0 1 0.1  Visual Reproduction I VM 23 7 16   Visual Reproduction II VM 0 1 0.1  Symbol Span VWM 2 2 0.4            Auditory Memory Process Score Summary      Process Score Raw Score Scaled Score Percentile Rank Cumulative Percentage (Base Rate)  LM II Recognition 13 - - 3-9%  VPA II Recognition 14 - - <=2%         Visual Memory Process Score Summary      Process Score Raw Score Scaled Score Percentile Rank Cumulative Percentage (Base Rate)  VR II Recognition 5 - - 51-75%    Summary of Results:  Testing Periods: 2023 initial assessment, 2024 repeat assessment  Verbal fluency Initial mild weakness in semantic fluency with average lexical fluency performance in 2023. Significant reduction in lexical fluency from 2023 to 2024. Semantic  fluency remained stable but weak. Overall mild loss in both lexical and semantic fluency domains.  GLOBAL COGNITIVE CAPACITY General stability maintained between assessments. Significant improvements noted in working memory scores from 2023 to 2024. Average performance on visual scanning, visual searching, and speed of mental operations remained stable across both testing dates. Primary improvements observed in working memory with stable to slight increases in overall cognitive functioning.  WECHSLER ADULT INTELLIGENCE SCALE SUBTESTS General stability with mild to moderate weaknesses in verbal reasoning and problem solving. Stable but slightly lower than expected performance on vocabulary knowledge. Stable performance for general fund of information. Visual spatial and visual reasoning performances showed weaknesses but demonstrated improvements between assessments. Significant weakness and deficits on broad visual intelligence and perceptual organizational skills in 2023 improved to low average performance in 2024, representing significant improvement. Overall stability or slight improvement on visual spatial, visual constructional, and visual processing components.  Auditory encoding and processing Significant improvements in auditory encoding, particularly primary auditory encoding between 2023 and 2024. Stability maintained in ability to actively process information in auditory store across both assessments.  Visual encoding and processing The patient continued with significant deficits for auditory encoding and significant lack of ability to actively process information or auditory scores.  While there were no change between 2023 and 2024 deficits remained significant.  MEMORY AND LEARNING COMPONENTS Greatest changes observed in this domain. Already significant weaknesses and deficits relative to premorbid estimates for auditory and visual memory components in 2023, including initial learning and  retention over delay periods. Progressive decline noted in these already weak 2023 performances. Significant change in auditory memory. Significant loss in visual memory and learning. Significant change and loss in immediate memory. Significant loss in delayed memory over past year. Individual subtest analysis shows losses in each subtest except initial learning for visual figures, but significant degradation and loss of this information over time. Some improvement noted for visual memory under recognition/cued recall. No improvements with recognition cued recall for language-based information in both assessments.  CLINICAL SIGNIFICANCE Memory loss represents primary area of concern, as other cognitive domains have not shown particular decay or loss of performance over the past year. Almost all changes noted between current and previous neuropsychological test data relate to memory loss.  However, there is a significant loss in global cognitive functioning for both of the 2023 and 2024 assessments from estimates of premorbid intellectual and cognitive functioning.  Impression/Diagnosis:   The results of the current neuropsychological evaluation with direct comparisons to 2023 assessment including review of available medical records, subjective reports from patient and family members, laboratory findings, and objective neuropsychological test data all show consistencies.  While there has not been significant declines or changes in global cognitive capacity over the past year or so she does show significant loss of global capacity from premorbid estimates overall.  The most significant change is progressive loss in memory functions for both auditory and visual memory with significant deficits for auditory and visual encoding capacity her ability to actively process information in her auditory visual Register, significant deficits in storage and organization of information and deficits with retrieval even under  cued recall formats.  While initially in 2023 the deficits were felt to be more related to depression, anxiety and psychosocial stressors the progressive decline in change does  begin to become much more consistent with a mild major neurocognitive disorder of the Alzheimer's type.  Anxiety and depression do appear to be playing some role but the changes over the past year are much more consistent with what you would expect with an Alzheimer's type pathology representing the cognitive decline over the past year.  These findings are also consistent with laboratory studies for both ATN blood profile as well as APOE4 genetic profile.  I will sit down with the patient and her family and go over the results of the current neuropsychological test data.  Reviewing most recent neurological consults there does appear to be a process ongoing considering Lequembi MAB iv therapy and I will let Dr. Dohmeier/Dr.Yan/Dr. Buck know that the current results are in and have been interpreted from the neuropsychological evaluation to help with any decision making regarding ongoing therapy and treatment.  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 Clinical Neuropsychologist

## 2024-06-18 ENCOUNTER — Other Ambulatory Visit: Payer: Self-pay | Admitting: Neurology

## 2024-06-21 ENCOUNTER — Ambulatory Visit: Payer: BLUE CROSS/BLUE SHIELD | Admitting: Family Medicine

## 2024-06-22 ENCOUNTER — Encounter (HOSPITAL_BASED_OUTPATIENT_CLINIC_OR_DEPARTMENT_OTHER): Payer: BLUE CROSS/BLUE SHIELD | Admitting: Psychology

## 2024-06-22 DIAGNOSIS — F418 Other specified anxiety disorders: Secondary | ICD-10-CM

## 2024-06-22 DIAGNOSIS — R4789 Other speech disturbances: Secondary | ICD-10-CM | POA: Diagnosis not present

## 2024-06-22 DIAGNOSIS — R413 Other amnesia: Secondary | ICD-10-CM

## 2024-06-22 DIAGNOSIS — F02A0 Dementia in other diseases classified elsewhere, mild, without behavioral disturbance, psychotic disturbance, mood disturbance, and anxiety: Secondary | ICD-10-CM

## 2024-06-22 DIAGNOSIS — G309 Alzheimer's disease, unspecified: Secondary | ICD-10-CM

## 2024-06-22 NOTE — Progress Notes (Signed)
 Neuropsychological Evaluation   Patient:  Cindy [name]   DOB: 1951-11-21  MR Number: [id number]  Location: [facility] PHYSICAL MEDICINE AND REHABILITATION [address] [place], [address] [place] [address] Dept: [phone]  Start: 1 PM End: 2 PM  Provider/Observer:     Norleen JONELLE Asa PsyD  Chief Complaint:      Chief Complaint  Patient presents with   Memory Loss   06/22/2024: Today I provided feedback regarding the results of the recent neuropsychological evaluation and have included a summation of that visit below.  I have also included the reason for service for the evaluation and the summary of the neuropsychological evaluation below for convenience.  The complete neuropsychological evaluation can be found in the patient's EMR dated 06/15/2024.  OUT OF SESSION TASK REVIEW: - Patient was last seen in September 2024 for neuropsychological testing. - This session is a follow-up to review the results of repeat testing.  CURRENT PRESENTATION: - Patient reports worsening memory issues. - Husband has noticed changes, such as asking the same question repeatedly within a short timeframe. - Patient reports increased desire to sleep, especially after being active the previous day.  SESSION CONTENT: - The session focused on reviewing the results of repeat neuropsychological testing performed in September 2024 and comparing them to the initial testing from 2023. - Discussed the findings, which show a pattern of cognitive change consistent with Alzheimer's disease pathology. - Explained that while the changes since the first testing are not large, they are present and show a decline. - Informed the Patient that the diagnosis is mild major neurocognitive disorder due to Alzheimer's disease, without behavioral disturbances. This is a late-onset presentation. 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 - Differentiated the presentation from other causes of cognitive decline, noting the MRI results do not show evidence of strokes or significant small vessel disease (cerebrovascular change). - Patient's physical presentation, including gait and movement, does not suggest Lewy body dementia. - Discussed that while the APOE4 gene mutation and tau protein levels are correlated with Alzheimer's, they are not definitive diagnostic markers on their own but add confidence to the clinical picture. - Patient inquired about the heritability of Alzheimer's. Explained that certain genes increase vulnerability and noted the Patient's brother passed away from the condition. - Discussed potential treatment options, specifically mentioning newer infusion medications that can slow the progression of the disease. - Explained that these medications have potential risks risk factors and as such require a thorough diagnostic workup before consideration.  These will be issues that Dr. Chalice will discuss with her coming visit. - Clarified that the decision regarding medication is to be made by the Patient and her neurologist, Dr. Chalice. This clinician will not make a recommendation on medical treatment. - Patient confirmed she has an upcoming appointment with Dr. Lenn in approximately three weeks. - Advised the Patient on how to support her husband in responding to her memory lapses, suggesting he simply answer questions again rather than pointing out they were just asked.  INTERVENTION: - Psychoeducation provided regarding the diagnosis of late-onset Alzheimer's disease pathology. - Explained the role of neuropsychological testing, genetic markers (APOE4), and blood tests (tau proteins) in forming the diagnostic impression. - Used an analogy comparing tau proteins to  cholesterol to explain their role in  brain health and pathology with relationship of properly folded versus missed folded tau proteins.  Provided supportive counseling and advice for managing the condition and interacting with family.  RISK ASSESSMENT AND MANAGEMENT: - Suicidal Ideation: No evidence of suicidal ideation. - Homicidal Ideation: No evidence of homicidal ideation. - Self-harm: No evidence of self-harm. - Violence & Aggression: No evidence of violence or aggression.  MENTAL STATUS EXAMINATION: Mood: Reported feeling concerned but was receptive to information. Affect: Appropriate to the content of the discussion, range was congruent with mood. Thoughts: Thought process was linear and goal-directed. Content focused on understanding the diagnosis and its implications. Insight: Good. Demonstrates understanding of memory difficulties and the implications of the diagnosis. Judgment: Good. Appears capable of understanding treatment options and making informed decisions with her neurologist.  OUT OF SESSION TASKS - Provided Patient with specific lifestyle recommendations to potentially slow disease progression: - Maintain daily sustained physical activity. - Remain socially active and continue engaging in enjoyable activities like church choir. - Eat a healthy diet with plenty of vegetables. - Maintain a regular sleep-wake cycle, aligning with the sun (awake during the day, asleep at night). - Advised Patient to share the neuropsychological report with her husband.  PLAN FOR NEXT SESSION - No follow-up session scheduled at this time. - Patient was informed that this clinician is available if needed in the future. - Plan is for the Patient to follow up with her neurologist, Dr. Chalice, to discuss medical management and potential treatments. This clinician will send a secure chat to Dr. Chalice to ensure she has the report. The Patient's primary care provider, Dr. Velma Ku, also  has access to the report via the EMR.  Reason For Service:      Cindy [name] is a 73 year old female referred for neuropsychological evaluation by her treating neurologist True Mar, MD and she has completed the initial or first neuropsychological evaluation at the end of 2023.  This is a follow-up appointment to consider repeat testing if warranted.  I have included portions of the previous neuropsychological evaluation below for convenience and her full neuropsych evaluation can be found in the patient's EMR dated 11/26/2022.   During most recent clinical interview in 2024, the patient reports that she feels like she is doing better than she was the previous year.  The patient reports that she was started on Aricept  to see if it would help any.  Patient began developing severe vivid nightmares that triggered her to stop taking the medication.  She coordinated with her treating neurologist and the medication was changed from taking it at night to daytime and the nightmares have stopped.  Patient reports that she did not notice any particular improvement in her memory or worsening of her memory when she discontinued the Aricept  but she is now taking it again without any apparent side effect.  Patient reports that she feels like her memory is better than it was when I saw her 1 year ago but she does continue to have significant memory difficulties.  The patient reports that her mood is good.  Recently, the patient had a 2-week vacation with some long-term friends that she has that also were nurses.  The patient reports that they did note changes in her memory from what they were familiar with her capacity in the past.  Patient does acknowledge ongoing memory difficulties but does not feel like they are worse.  Patient denies any geographic disorientation and denies any changes in reasoning or problem-solving or visual  spatial/visual constructional changes.  The patient reports that she is still doing fine when  she is cooking or driving or other independent activities of daily living.  The patient reports that she still has difficulty remembering issues with semantic memory greater than episodic memory.   During follow-up clinical interview, the patient did repeat several comments and descriptions that she had said almost word for word earlier in the clinical interview.  It was rather unclear whether she was aware that she had already made the statements are not but she did tend to repeat herself and descriptions of what were going on.  The patient had a very positive affect with good mood state and was engaged and alert throughout.  Patient did appear oriented x 4.  I did not do any screening of memory functions during today's visit as we were setting her up for formal repeat testing to be done very soon.  The patient is aware of significant memory changes but recent mental status exams do not show significant or profound difficulties.  We discussed the benefits and drawbacks of doing repeat testing in the patient agreed to and wanted to repeat testing for direct comparisons to her performances last year.   Impression/Diagnosis:   The results of the current neuropsychological evaluation with direct comparisons to 2023 assessment including review of available medical records, subjective reports from patient and family members, laboratory findings, and objective neuropsychological test data all show consistencies.  While there has not been significant declines or changes in global cognitive capacity over the past year or so she does show significant loss of global capacity from premorbid estimates overall.  The most significant change is progressive loss in memory functions for both auditory and visual memory with significant deficits for auditory and visual encoding capacity her ability to actively process information in her auditory visual Register, significant deficits in storage and organization of information and  deficits with retrieval even under cued recall formats.  While initially in 2023 the deficits were felt to be more related to depression, anxiety and psychosocial stressors the progressive decline in change does begin to become much more consistent with a mild major neurocognitive disorder of the Alzheimer's type.  Anxiety and depression do appear to be playing some role but the changes over the past year are much more consistent with what you would expect with an Alzheimer's type pathology representing the cognitive decline over the past year.  These findings are also consistent with laboratory studies for both ATN blood profile as well as APOE4 genetic profile.  I will sit down with the patient and her family and go over the results of the current neuropsychological test data.  Reviewing most recent neurological consults there does appear to be a process ongoing considering Lequembi MAB iv therapy and I will let Dr. Dohmeier/Dr.Yan/Dr. Buck know that the current results are in and have been interpreted from the neuropsychological evaluation to help with any decision making regarding ongoing therapy and treatment.  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 Clinical Neuropsychologist

## 2024-06-23 ENCOUNTER — Telehealth: Payer: Self-pay | Admitting: Neurology

## 2024-06-23 DIAGNOSIS — G309 Alzheimer's disease, unspecified: Secondary | ICD-10-CM

## 2024-06-23 MED ORDER — MEMANTINE HCL 10 MG PO TABS: 10.0000 mg | ORAL_TABLET | Freq: Two times a day (BID) | ORAL | 5 refills | Status: DC

## 2024-06-23 MED ORDER — DONEPEZIL HCL 10 MG PO TABS: 10.0000 mg | ORAL_TABLET | Freq: Every day | ORAL | 1 refills | Status: DC

## 2024-06-24 ENCOUNTER — Ambulatory Visit
Admission: RE | Admit: 2024-06-24 | Discharge: 2024-06-24 | Disposition: A | Discharge status: Home / Self Care | Payer: Self-pay | Source: Ambulatory Visit | Attending: Neurology | Admitting: Neurology

## 2024-06-24 DIAGNOSIS — G309 Alzheimer's disease, unspecified: Secondary | ICD-10-CM

## 2024-06-24 DIAGNOSIS — G319 Degenerative disease of nervous system, unspecified: Secondary | ICD-10-CM | POA: Diagnosis not present

## 2024-06-24 DIAGNOSIS — G3184 Mild cognitive impairment, so stated: Secondary | ICD-10-CM | POA: Diagnosis not present

## 2024-06-24 DIAGNOSIS — F02B18 Dementia in other diseases classified elsewhere, moderate, with other behavioral disturbance: Secondary | ICD-10-CM | POA: Diagnosis not present

## 2024-06-24 MED ORDER — GADOPICLENOL 0.5 MMOL/ML IV SOLN
7.0000 mL | Freq: Once | INTRAVENOUS | Status: AC | PRN
  Administered 2024-06-24: 7 mL via INTRAVENOUS

## 2024-06-26 NOTE — Telephone Encounter (Signed)
 Pet scan: no auth required sent to [facility] [phone]

## 2024-06-28 ENCOUNTER — Ambulatory Visit: Payer: BLUE CROSS/BLUE SHIELD | Admitting: Family Medicine

## 2024-07-05 ENCOUNTER — Ambulatory Visit (INDEPENDENT_AMBULATORY_CARE_PROVIDER_SITE_OTHER): Admitting: Family Medicine

## 2024-07-05 ENCOUNTER — Encounter: Payer: Self-pay | Admitting: Family Medicine

## 2024-07-05 VITALS — BP 142/75 | HR 84 | Ht 67.0 in | Wt 160.0 lb

## 2024-07-05 DIAGNOSIS — G319 Degenerative disease of nervous system, unspecified: Secondary | ICD-10-CM

## 2024-07-05 DIAGNOSIS — F418 Other specified anxiety disorders: Secondary | ICD-10-CM

## 2024-07-05 NOTE — Progress Notes (Signed)
 Cindy [name] - 73 y.o. female MRN [id number]  Date of birth: 02/23/1951  Subjective Chief Complaint  Patient presents with   Back Pain   Insect Bite    HPI Cindy [name] is a 73 y.o. female here today for follow up.  She reports she is feeling well today.  She has since seen Dr. Chalice for further workup and management of her cognitive decline.  Recent MRI unremarkable.  She has upcoming PET scan to evaluate for amyloid plaque.  Currently she is treated with Aricept  and Namenda .  Her husband notes that she is more delirious when taking evening dose of Namenda .  He has changed to only doing this in the morning.  She does fine with this during the day.  Continues on paroxetine  and mirtazapine .  Rare use of clonazepam .    ROS:  A comprehensive ROS was completed and negative except as noted per HPI  No Known Allergies  Past Medical History:  Diagnosis Date   Arthritis    Cancer (HCC)    breast   Depression    GERD (gastroesophageal reflux disease)    Hyperlipidemia    Tendonitis     Past Surgical History:  Procedure Laterality Date   BREAST SURGERY  2000   right   ELBOW SURGERY     right    Social History   Socioeconomic History   Marital status: Married    Spouse name: Todd   Number of children: 0   Years of education: 18   Highest education level: Master's degree (e.g., MA, MS, MEng, MEd, MSW, MBA)  Occupational History   Occupation: Retired.   Occupation: retired  Tobacco Use   Smoking status: Never   Smokeless tobacco: Never  Vaping Use   Vaping status: Never Used  Substance and Sexual Activity   Alcohol use: Not Currently    Comment: rarely   Drug use: No   Sexual activity: Yes    Birth control/protection: Post-menopausal  Other Topics Concern   Not on file  Social History Narrative   Lives with her husband. They help take care of her brother's grandchildren. She enjoys singing in the choir and gardening.   Social Drivers of Manufacturing engineer Strain: Low Risk  (12/28/2022)   Overall Financial Resource Strain (CARDIA)    Difficulty of Paying Living Expenses: Not hard at all  Food Insecurity: No Food Insecurity (11/10/2023)   Hunger Vital Sign    Worried About Running Out of Food in the Last Year: Never true    Ran Out of Food in the Last Year: Never true  Transportation Needs: No Transportation Needs (11/10/2023)   PRAPARE - Administrator, Civil Service (Medical): No    Lack of Transportation (Non-Medical): No  Physical Activity: Inactive (12/28/2022)   Exercise Vital Sign    Days of Exercise per Week: 0 days    Minutes of Exercise per Session: 0 min  Stress: No Stress Concern Present (12/28/2022)   [employer] of Occupational Health - Occupational Stress Questionnaire    Feeling of Stress : Not at all  Social Connections: Socially Integrated (12/28/2022)   Social Connection and Isolation Panel    Frequency of Communication with Friends and Family: More than three times a week    Frequency of Social Gatherings with Friends and Family: More than three times a week    Attends Religious Services: More than 4 times per year    Active Member of [employer]  or Organizations: Yes    Attends Engineer, structural: More than 4 times per year    Marital Status: Married    Family History  Problem Relation Age of Onset   Cancer Mother        bone   Hypertension Father    Diabetes Father    Stroke Father    Hypertension Sister    Hypertension Sister    Alzheimer's disease Brother    Colon cancer Neg Hx    Esophageal cancer Neg Hx    Rectal cancer Neg Hx    Stomach cancer Neg Hx     Health Maintenance  Topic Date Due   Hepatitis B Vaccines (2 of 3 - 19+ 3-dose series) 11/16/2011   Medicare Annual Wellness (AWV)  12/29/2023   COVID-19 Vaccine (7 - 2024-25 season) 01/27/2025 (Originally 08/15/2023)   INFLUENZA VACCINE  07/14/2024   MAMMOGRAM  07/20/2024   DTaP/Tdap/Td (4 - Td or Tdap)  04/05/2028   Colonoscopy  08/28/2029   Pneumococcal Vaccine: 50+ Years  Completed   DEXA SCAN  Completed   Hepatitis C Screening  Completed   Zoster Vaccines- Shingrix  Completed   HPV VACCINES  Aged Out   Meningococcal B Vaccine  Aged Out     ----------------------------------------------------------------------------------------------------------------------------------------------------------------------------------------------------------------- Physical Exam BP (!) 142/75 (BP Location: Left Arm, Patient Position: Sitting, Cuff Size: Normal)   Pulse 84   Ht 5' 7 (1.702 m)   Wt 160 lb (72.6 kg)   SpO2 98%   BMI 25.06 kg/m   Physical Exam Constitutional:      Appearance: Normal appearance.  HENT:     Head: Normocephalic and atraumatic.  Cardiovascular:     Rate and Rhythm: Normal rate and regular rhythm.  Pulmonary:     Effort: Pulmonary effort is normal.     Breath sounds: Normal breath sounds.  Musculoskeletal:     Cervical back: Neck supple.  Neurological:     Mental Status: She is alert.  Psychiatric:        Mood and Affect: Mood normal.        Behavior: Behavior normal.     ------------------------------------------------------------------------------------------------------------------------------------------------------------------------------------------------------------------- Assessment and Plan  Depression with anxiety She did discontinue bupropion  as she thought it may be causing her more nausea.  Adding mirtazapine  at bedtime.  She will continue Paxil  at current strength.  Neurodegenerative cognitive impairment [facility]) She is seeing neurology.  Has upcoming PET scan for amyloid evaluation.  She will continue Namenda  and Aricept .   No orders of the defined types were placed in this encounter.   Return in about 4 months (around 11/05/2024) for f/u From neurol evaluation.

## 2024-07-05 NOTE — Assessment & Plan Note (Signed)
 She is seeing neurology.  Has upcoming PET scan for amyloid evaluation.  She will continue Namenda  and Aricept .

## 2024-07-05 NOTE — Assessment & Plan Note (Signed)
 She did discontinue bupropion  as she thought it may be causing her more nausea.  Adding mirtazapine  at bedtime.  She will continue Paxil  at current strength.

## 2024-07-10 ENCOUNTER — Other Ambulatory Visit: Payer: Self-pay | Admitting: Family Medicine

## 2024-07-10 DIAGNOSIS — R21 Rash and other nonspecific skin eruption: Secondary | ICD-10-CM

## 2024-07-10 DIAGNOSIS — G43019 Migraine without aura, intractable, without status migrainosus: Secondary | ICD-10-CM

## 2024-07-16 ENCOUNTER — Other Ambulatory Visit: Payer: Self-pay | Admitting: Neurology

## 2024-07-17 ENCOUNTER — Telehealth: Payer: Self-pay | Admitting: Neurology

## 2024-07-17 NOTE — Telephone Encounter (Signed)
 LVM and sent mychart msg informing pt of appt change with Dr. Chalice

## 2024-07-17 NOTE — Telephone Encounter (Signed)
 Cindy [name] has left her 4 voice mails to schedule the pet scan and haven't received a call back so they are cancelling the order. 07/17/24 lvm tdc 07/10/24 lvm tdc  07/04/24 lvm tdc  06/27/24 lvm tdc

## 2024-07-20 ENCOUNTER — Telehealth: Payer: Self-pay | Admitting: Neurology

## 2024-07-20 DIAGNOSIS — G309 Alzheimer's disease, unspecified: Secondary | ICD-10-CM

## 2024-07-20 DIAGNOSIS — F0390 Unspecified dementia without behavioral disturbance: Secondary | ICD-10-CM

## 2024-07-20 NOTE — Telephone Encounter (Signed)
 While I had the pt on the phone I went ahead and completed the FAQ score

## 2024-07-20 NOTE — Telephone Encounter (Signed)
 Order sent to [facility] to schedule. [phone]

## 2024-07-20 NOTE — Telephone Encounter (Signed)
 Attempted to call the pt and there was no answer. LVM for the patient to call back to discuss moving forward with treatment.  Called the pt husband's number who is listed on DPR he is with the patient.  Was able to speak with both the husband and the pt about the multiple attempts made to get her scheduled for the PET imaging. Advised that was the last pc needed to move forward with her testing to determine leqembi infusion. They verbalized understanding.

## 2024-07-25 ENCOUNTER — Encounter (HOSPITAL_COMMUNITY)
Admission: RE | Admit: 2024-07-25 | Discharge: 2024-07-25 | Disposition: A | Discharge status: Home / Self Care | Payer: Self-pay | Source: Ambulatory Visit | Attending: Neurology | Admitting: Neurology

## 2024-07-25 DIAGNOSIS — F0390 Unspecified dementia without behavioral disturbance: Secondary | ICD-10-CM | POA: Diagnosis not present

## 2024-07-25 DIAGNOSIS — G309 Alzheimer's disease, unspecified: Secondary | ICD-10-CM | POA: Diagnosis not present

## 2024-07-25 DIAGNOSIS — R413 Other amnesia: Secondary | ICD-10-CM | POA: Diagnosis not present

## 2024-07-25 MED ORDER — FLORBETAPIR F 18 500-1900 MBQ/ML IV SOLN
9.7900 | Freq: Once | INTRAVENOUS | Status: AC
  Administered 2024-07-25 (×2): 9.79 via INTRAVENOUS

## 2024-07-26 ENCOUNTER — Other Ambulatory Visit: Payer: Self-pay | Admitting: Neurology

## 2024-07-26 ENCOUNTER — Ambulatory Visit: Payer: Self-pay | Admitting: Neurology

## 2024-07-26 DIAGNOSIS — Z1231 Encounter for screening mammogram for malignant neoplasm of breast: Secondary | ICD-10-CM | POA: Diagnosis not present

## 2024-07-26 LAB — HM MAMMOGRAPHY

## 2024-07-26 MED ORDER — LEQEMBI 200 MG/2ML IV SOLN
INTRAVENOUS | 9 refills | Status: DC
Start: 2024-07-26 — End: 2024-08-18

## 2024-07-26 NOTE — Telephone Encounter (Signed)
 Called the patient and reviewed the results and recommendation of starting leqembi . Advised the pt of that process and she had concerns and questions about the medication. I did review that process through the infusion suite and advised I would pass along the concern of the medication to Dr Chalice. In the meantime the pt was ok with starting the insurance auth process to get the medication approved.

## 2024-07-26 NOTE — Telephone Encounter (Signed)
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 A positive scan demonstrates the presence of AD pathology.  I will inform Cindy [name] of these results but reached only VM- . Our goal is to start monoclonal antibody infusion therapy.  Please prepare order for infusion with Lequembi. ----- Message ----- From: Interface, Rad Results In Sent: 07/26/2024  11:32 AM EDT To: Dedra Gores, MD

## 2024-07-31 ENCOUNTER — Ambulatory Visit: Admitting: Neurology

## 2024-07-31 NOTE — Telephone Encounter (Signed)
 Paperwork was signed and given to [facility] to start the auth process.

## 2024-08-15 ENCOUNTER — Encounter: Payer: Self-pay | Admitting: Neurology

## 2024-08-18 ENCOUNTER — Other Ambulatory Visit: Payer: Self-pay | Admitting: Family Medicine

## 2024-08-18 ENCOUNTER — Encounter: Payer: Self-pay | Admitting: Neurology

## 2024-08-18 ENCOUNTER — Ambulatory Visit (INDEPENDENT_AMBULATORY_CARE_PROVIDER_SITE_OTHER): Admitting: Neurology

## 2024-08-18 VITALS — BP 110/64 | HR 64 | Ht 67.5 in | Wt 152.6 lb

## 2024-08-18 DIAGNOSIS — G319 Degenerative disease of nervous system, unspecified: Secondary | ICD-10-CM

## 2024-08-18 DIAGNOSIS — F0280 Dementia in other diseases classified elsewhere without behavioral disturbance: Secondary | ICD-10-CM | POA: Diagnosis not present

## 2024-08-18 DIAGNOSIS — G309 Alzheimer's disease, unspecified: Secondary | ICD-10-CM | POA: Diagnosis not present

## 2024-08-18 DIAGNOSIS — F330 Major depressive disorder, recurrent, mild: Secondary | ICD-10-CM | POA: Diagnosis not present

## 2024-08-18 MED ORDER — DONEPEZIL HCL 10 MG PO TABS: 10.0000 mg | ORAL_TABLET | Freq: Every day | ORAL | 3 refills | Status: AC

## 2024-08-18 MED ORDER — BUPROPION HCL ER (XL) 150 MG PO TB24: 150.0000 mg | ORAL_TABLET | Freq: Every morning | ORAL | 5 refills | Status: DC

## 2024-08-18 MED ORDER — BUPROPION HCL ER (XL) 150 MG PO TB24: 450.0000 mg | ORAL_TABLET | Freq: Every day | ORAL | 5 refills | Status: DC

## 2024-08-18 MED ORDER — MIRTAZAPINE 7.5 MG PO TABS: 7.5000 mg | ORAL_TABLET | Freq: Every evening | ORAL | 2 refills | Status: DC | PRN

## 2024-08-18 MED ORDER — LEQEMBI 200 MG/2ML IV SOLN: INTRAVENOUS | 9 refills | Status: AC

## 2024-08-18 NOTE — Progress Notes (Addendum)
 Provider:  Dedra Gores, MD  Primary Care Physician:  Alvia Bring, DO [address] [facility] [address] [place] [address]     Referring Provider: Alvia Bring, Do [address] [place],  [place] [address]          Chief Complaint according to patient   Patient presents with:                HISTORY OF PRESENT ILLNESS:  Cindy [name] is a 73 y.o. female patient who is here for revisit 08/18/2024 for Alzheimer's disease with mild - moderate manifestations:   Followed up on:  1) Dementia panel blood test and ATN,  positive for p-tau and Amyloid index shift.  We have already discussed these results by phone.   2) genetics for AD late onset risk ( Apolipo E 4 one allel) and CMET.  No other treatable cause of dementia was present . elevated risk for genetic late onset Alzheimer's disease by 20-30 % with one allel , higher in women. An individual with only one allel has a higher risk for ARIA>   MRI brain:  no bleeds, no CAA -Cerebral Amyloid Angiopathy *.  PET scan confirmed amyloid deposits.     *CAA is a major risk factor for developing ARIA.  Not having CAA  does not exclude risk for developing ARiA-  but CAA patients would not be considered for therapy.    ARIA (Amyloid-Related Imaging Abnormalities) are a class of potential side effects in patients receiving anti-amyloid therapies for Alzheimer's Disease, classified as ARIA-E (edema and/or effusion) or ARIA-H (hemorrhage).  These abnormalities are detected via MRI and, while often asymptomatic, can sometimes cause symptoms like headache or confusion.        08/18/2024   11:29 AM 04/20/2024    8:50 AM 02/10/2023    7:30 AM 08/10/2022    8:14 AM  MMSE - Mini Mental State Exam  Orientation to time 5 2 5 5   Orientation to Place 5 5 5 5   Registration 3 3 3 3   Attention/ Calculation 2 4 3 4   Recall 0 0 2 3  Language- name 2 objects 2 2 2 2   Language- repeat 1 1 1 1   Language-  follow 3 step command 3 3 3 3   Language- read & follow direction 1 1 1 1   Write a sentence 1 1 1 1   Copy design 1 1 1 1   Total score 24 23 27 29     .    05/26/2024   11:47 AM  Montreal Cognitive Assessment   Visuospatial/ Executive (0/5) 3  Naming (0/3) 3  Attention: Read list of digits (0/2) 2  Attention: Read list of letters (0/1) 0  Attention: Serial 7 subtraction starting at 100 (0/3) 2  Language: Repeat phrase (0/2) 0  Language : Fluency (0/1) 1  Abstraction (0/2) 1  Delayed Recall (0/5) 0  Orientation (0/6) 6  Total 18      Chief report according to patient :  I feel my memory has been stable - and I am ready to start an Monoclonal AB therapy . We discussed Leqembi  as I have more experience with this medication than Kisunla.   Her mood is good, but I like for her to stay on her antidepressive medications.   I have ordered Remeron  as a PRN sleep aid instead of daily - she had some evening hour confusional episodes  and these let to some agitation , upset her husband greatly.  My hope is to initiate  monoclonal Ab antiamyloid Therapy ASAP.  Continue Aricept  /Namenda  alongside.   Discussion of MONOCLONAL AB THERAPY ANTI AMYLOID - infusion drugs like leqembi  or kisunla is potentially worth it for some individuals with early-stage Alzheimer's disease or mild cognitive impairment (MCI) with confirmed amyloid pathology, as clinical trials show it can slow disease progression, but not cure it.  The decision is individual and depends on weighing modest clinical benefits against substantial risks, like amyloid-related imaging abnormalities (ARIA), the drug's cost, the need for frequent monitoring (infusions, MRIs), and the logistics of treatment. It is most beneficial when started early in the disease process, ideally before significant brain damage occurs.       Review of Systems: Out of a complete 14 system review, the patient complains of only the following symptoms, and all  other reviewed systems are negative.:  STM loss,  Semantic memory impairment.     Dr Corina has followed up on 7 -09-2024 with a new REvisit;   06/22/2024: Today I provided feedback regarding the results of the recent neuropsychological evaluation and have included a summation of that visit below.  I have also included the reason for service for the evaluation and the summary of the neuropsychological evaluation below for convenience.  The complete neuropsychological evaluation can be found in the patient's EMR dated 06/15/2024.   OUT OF SESSION TASK REVIEW: - Patient was last seen in September 2024 for neuropsychological testing , this was her second visit. . - This session is a follow-up to review the results of repeat testing.  CURRENT PRESENTATION: - Patient reports worsening memory issues. - Husband has noticed changes, such as asking the same question repeatedly within a short timeframe. - Patient reports increased desire to sleep, especially after being active the previous day.  SESSION CONTENT: - The session focused on reviewing the results of repeat neuropsychological testing performed in September 2024 and comparing them to the initial testing from 2023. - Discussed the findings, which show a pattern of cognitive change consistent with Alzheimer's disease pathology. - Explained that while the changes since the first testing are not large, they are present and show a decline. - Informed the Patient that the diagnosis is mild major neurocognitive disorder due to Alzheimer's disease, without behavioral disturbances. This is a late-onset presentation. - Discussed contributing factors to the diagnosis, including the pattern of cognitive changes, genetic testing results (APOE4 gene mutation), and blood tests showing a higher degree of misfolded tau proteins (ATN profile). - Differentiated the presentation from other causes of cognitive decline, noting the MRI results do not show  evidence of strokes or significant small vessel disease (cerebrovascular change). - Patient's physical presentation, including gait and movement, does not suggest Lewy body dementia. - Discussed that while the APOE4 gene mutation and tau protein levels are correlated with Alzheimer's, they are not definitive diagnostic markers on their own but add confidence to the clinical picture. - Patient inquired about the heritability of Alzheimer's. Explained that certain genes increase vulnerability and noted the Patient's brother passed away from the condition. - Discussed potential treatment options, specifically mentioning newer infusion medications that can slow the progression of the disease. - Explained that these medications have potential risks risk factors and as such require a thorough diagnostic workup before consideration.  These will be issues that Dr. Chalice will discuss with her coming visit. - Clarified that the decision regarding medication is to  be made by the Patient and her neurologist, Dr. Chalice. This clinician will not make a recommendation on medical treatment. - Patient confirmed she has an upcoming appointment with Dr. Lenn in approximately three weeks. - Advised the Patient on how to support her husband in responding to her memory lapses, suggesting he simply answer questions again rather than pointing out they were just asked.  INTERVENTION: - Psychoeducation provided regarding the diagnosis of late-onset Alzheimer's disease pathology. - Explained the role of neuropsychological testing, genetic markers (APOE4), and blood tests (tau proteins) in forming the diagnostic impression. - Used an analogy comparing tau proteins to cholesterol to explain their role in brain health and pathology with relationship of properly folded versus missed folded tau proteins.  Provided supportive counseling and advice for managing the condition and interacting with family.  RISK ASSESSMENT AND  MANAGEMENT: - Suicidal Ideation: No evidence of suicidal ideation. - Homicidal Ideation: No evidence of homicidal ideation. - Self-harm: No evidence of self-harm. - Violence & Aggression: No evidence of violence or aggression.  MENTAL STATUS EXAMINATION: Mood: Reported feeling concerned but was receptive to information. Affect: Appropriate to the content of the discussion, range was congruent with mood. Thoughts: Thought process was linear and goal-directed. Content focused on understanding the diagnosis and its implications. Insight: Good. Demonstrates understanding of memory difficulties and the implications of the diagnosis. Judgment: Good. Appears capable of understanding treatment options and making informed decisions with her neurologist.  OUT OF SESSION TASKS - Provided Patient with specific lifestyle recommendations to potentially slow disease progression: - Maintain daily sustained physical activity. - Remain socially active and continue engaging in enjoyable activities like church choir. - Eat a healthy diet with plenty of vegetables. - Maintain a regular sleep-wake cycle, aligning with the sun (awake during the day, asleep at night). - Advised Patient to share the neuropsychological report with her husband.  PLAN FOR NEXT SESSION - No follow-up session scheduled at this time. - Patient was informed that this clinician is available if needed in the future. - Plan is for the Patient to follow up with her neurologist, Dr. Chalice, to discuss medical management and potential treatments. This clinician will send a secure chat to Dr. Chalice to ensure she has the report.  The Patient's primary care provider, Dr. Velma Ku, also has access to the report via the EMR.   Reason For Service:   Cindy [name] is a 73 year old female was originally referred for neuropsychological evaluation by her then treating neurologist True Mar, MD and she had completed the initial  neuropsychological evaluation at the end of 2023. This is a follow-up appointment to consider repeat testing if warranted.  I have included portions of the previous neuropsychological evaluation below for convenience and her full neuropsych evaluation can be found in the patient's EMR dated 11/26/2022. During most recent clinical interview in 2024, the patient reports that she feels like she is doing better than she was the previous year.  The patient reports that she was started on Aricept  to see if it would help any.  Patient began developing severe vivid nightmares that triggered her to stop taking the medication.  She coordinated with her treating neurologist and the medication was changed from taking it at night to daytime and the nightmares have stopped.   Patient reports that she did not notice any particular improvement in her memory or worsening of her memory when she discontinued the Aricept  but she is now taking it again without any apparent side effect.  Patient reports that she feels like her memory is better than it was when I saw her 1 year ago but she does continue to have significant memory difficulties.  The patient reports that her mood is good.   The patient reports that they did note changes in her memory from what they were familiar with her capacity in the past.  Patient does acknowledge ongoing memory difficulties but does not feel like they are worse.  Patient denies any geographic disorientation and denies any changes in reasoning or problem-solving or visual spatial/visual constructional changes.  The patient reports that she is still doing fine when she is cooking or driving or other independent activities of daily living.  The patient reports that she still has difficulty remembering issues with semantic memory greater than episodic memory.    Social History   Socioeconomic History   Marital status: Married    Spouse name: Todd   Number of children: 0   Years of education: 18    Highest education level: Master's degree (e.g., MA, MS, MEng, MEd, MSW, MBA)  Occupational History   Occupation: Retired.   Occupation: retired  Tobacco Use   Smoking status: Never   Smokeless tobacco: Never  Vaping Use   Vaping status: Never Used  Substance and Sexual Activity   Alcohol use: Not Currently    Comment: rarely   Drug use: No   Sexual activity: Yes    Birth control/protection: Post-menopausal  Other Topics Concern   Not on file  Social History Narrative   Lives with her husband. They help take care of her brother's grandchildren. She enjoys singing in the choir and gardening.   Social Drivers of Corporate investment banker Strain: Low Risk  (12/28/2022)   Overall Financial Resource Strain (CARDIA)    Difficulty of Paying Living Expenses: Not hard at all  Food Insecurity: No Food Insecurity (11/10/2023)   Hunger Vital Sign    Worried About Running Out of Food in the Last Year: Never true    Ran Out of Food in the Last Year: Never true  Transportation Needs: No Transportation Needs (11/10/2023)   PRAPARE - Administrator, Civil Service (Medical): No    Lack of Transportation (Non-Medical): No  Physical Activity: Inactive (12/28/2022)   Exercise Vital Sign    Days of Exercise per Week: 0 days    Minutes of Exercise per Session: 0 min  Stress: No Stress Concern Present (12/28/2022)   [employer] of Occupational Health - Occupational Stress Questionnaire    Feeling of Stress : Not at all  Social Connections: Socially Integrated (12/28/2022)   Social Connection and Isolation Panel    Frequency of Communication with Friends and Family: More than three times a week    Frequency of Social Gatherings with Friends and Family: More than three times a week    Attends Religious Services: More than 4 times per year    Active Member of [employer] or Organizations: Yes    Attends Engineer, structural: More than 4 times per year    Marital Status:  Married    Family History  Problem Relation Age of Onset   Cancer Mother        bone   Hypertension Father    Diabetes Father    Stroke Father    Hypertension Sister    Hypertension Sister    Alzheimer's disease Brother    Colon cancer Neg Hx    Esophageal cancer Neg Hx  Rectal cancer Neg Hx    Stomach cancer Neg Hx     Past Medical History:  Diagnosis Date   Arthritis    Cancer (HCC)    breast   Depression    GERD (gastroesophageal reflux disease)    Hyperlipidemia    Tendonitis     Past Surgical History:  Procedure Laterality Date   BREAST SURGERY  2000   right   ELBOW SURGERY     right     Current Outpatient Medications on File Prior to Visit  Medication Sig Dispense Refill   Calcium Carbonate-Vit D-Min (CALCIUM 1200 PO) Take 1 tablet by mouth daily.     clonazePAM  (KLONOPIN ) 1 MG tablet Take 1 tablet (1 mg total) by mouth 2 (two) times daily as needed. 60 tablet 1   donepezil  (ARICEPT ) 10 MG tablet Take 1 tablet (10 mg total) by mouth daily. 90 tablet 1   lovastatin  (MEVACOR ) 20 MG tablet Take 20 mg by mouth at bedtime.     memantine  (NAMENDA ) 10 MG tablet TAKE 1 TABLET BY MOUTH TWICE A DAY 180 tablet 2   Multiple Vitamin (MULTIVITAMIN) tablet Take 1 tablet by mouth daily.     Rimegepant Sulfate (NURTEC) 75 MG TBDP Take 1 tablet (75 mg total) by mouth daily as needed (migraine). 30 tablet 1   SUMAtriptan  (IMITREX ) 25 MG tablet Take 1 tablet (25 mg total) by mouth every 2 (two) hours as needed for migraine. May repeat in 2 hours if headache persists or recurs. 10 tablet 2   trimethoprim  (TRIMPEX ) 100 MG tablet TAKE 1 TABLET EVERY DAY 90 tablet 3   buPROPion  (WELLBUTRIN  XL) 150 MG 24 hr tablet Take 450 mg by mouth daily. (Patient not taking: Reported on 08/18/2024)     famotidine  (PEPCID ) 20 MG tablet TAKE 1 TABLET BY MOUTH TWICE A DAY (Patient not taking: Reported on 08/18/2024) 180 tablet 1   Lecanemab -irmb (LEQEMBI ) 200 MG/2ML SOLN Infusion at [facility] 2 mL 9    mirtazapine  (REMERON ) 7.5 MG tablet Take 1 tablet (7.5 mg total) by mouth at bedtime. Helps with sleep (Patient not taking: Reported on 08/18/2024) 90 tablet 2   ondansetron  (ZOFRAN -ODT) 4 MG disintegrating tablet Take 1 tablet (4 mg total) by mouth every 8 (eight) hours as needed for nausea or vomiting. 30 tablet 0   PARoxetine  (PAXIL ) 20 MG tablet Take 2 tablets (40 mg total) by mouth daily. (Patient not taking: Reported on 08/18/2024) 180 tablet 3   No current facility-administered medications on file prior to visit.       DIAGNOSTIC DATA (LABS, IMAGING, TESTING) - I reviewed patient records, labs, notes, testing and imaging myself where available.  Lab Results  Component Value Date   WBC 6.1 01/03/2024   HGB 11.1 01/03/2024   HCT 35.1 01/03/2024   MCV 83 01/03/2024   PLT 284 01/03/2024      Component Value Date/Time   NA 142 01/03/2024 1457   K 4.1 01/03/2024 1457   CL 104 01/03/2024 1457   CO2 24 01/03/2024 1457   GLUCOSE 76 01/03/2024 1457   GLUCOSE 94 12/27/2023 1130   BUN 5 (L) 01/03/2024 1457   CREATININE 0.78 01/03/2024 1457   CREATININE 1.02 12/16/2022 1153   CALCIUM 9.3 01/03/2024 1457   PROT 6.4 01/03/2024 1457   ALBUMIN 3.9 01/03/2024 1457   AST 15 01/03/2024 1457   ALT 12 01/03/2024 1457   ALKPHOS 118 01/03/2024 1457   BILITOT 0.3 01/03/2024 1457   GFRNONAA >60 12/27/2023  1130   GFRNONAA 65 11/22/2020 1116   GFRAA 76 11/22/2020 1116   Lab Results  Component Value Date   CHOL 198 12/23/2023   HDL 61 12/23/2023   LDLCALC 122 (H) 12/23/2023   LDLDIRECT 126.4 12/18/2013   TRIG 82 12/23/2023   CHOLHDL 2.4 12/16/2022   Lab Results  Component Value Date   HGBA1C 5.7 (H) 08/10/2022   Lab Results  Component Value Date   VITAMINB12 497 05/30/2024   Lab Results  Component Value Date   TSH 1.170 12/23/2023    PHYSICAL EXAM:  Vitals:   08/18/24 1127  BP: 110/64  Pulse: 64   No data found. Body mass index is 23.55 kg/m.   Wt Readings from Last 3  Encounters:  08/18/24 152 lb 9.6 oz (69.2 kg)  07/05/24 160 lb (72.6 kg)  05/26/24 157 lb 9.6 oz (71.5 kg)     Ht Readings from Last 3 Encounters:  08/18/24 5' 7.5 (1.715 m)  07/05/24 5' 7 (1.702 m)  05/26/24 5' 7 (1.702 m)      General: The patient is awake, alert and appears not in acute distress and groomed. Head: Normocephalic, atraumatic.  Attention span & concentration ability appears impaired, Jasiya is pleasant, well groomed and cooperative.  Speech is fluent,  without  dysarthria, dysphonia or aphasia.  Mood and affect are appropriate.   Cranial nerves: no loss of smell or taste reported  Pupils are equal and briskly reactive to light. Funduscopic exam deferred .  Extraocular movements in vertical and horizontal planes were intact and without nystagmus.  No Diplopia. Visual fields by finger perimetry are intact. Hearing was intact to soft voice and finger rubbing.    Facial sensation intact to fine touch.  Facial motor strength is symmetric and tongue and uvula move midline.  Neck ROM : rotation, tilt and flexion extension were normal for age and shoulder shrug was symmetrical.    Motor exam:  Symmetric bulk, tone and ROM.   Normal tone without cog wheeling, symmetric grip strength .   Sensory:  Fine touch, vibration were intact .  Proprioception tested in the upper extremities was normal.   Coordination: Rapid alternating movements in the fingers/hands were of normal speed.  Instructions were given once and followed.  The Finger-to-nose maneuver was without evidence of ataxia, dysmetria or tremor.   Gait and station: Patient could rise unassisted from a seated position, walked without assistive device.  Stance is of normal width/ base and the patient turned with 3 steps.  Toe and heel walk were deferred.  Deep tendon reflexes: in the  upper and lower extremities are symmetric and intact. Gait and Station: Normal.  Romberg's sign is absent.   ASSESSMENT AND PLAN  :   73 y.o. year old female  here with:   1) AD dementia with som,e episodic behaviour changes, had two events of sun downing in May,  Positive Family history and genetic predisposition increased by one allel of E4 . Her brother was affected with early onset ( age at onset 57-60) Alzheimer's dementia.   2) while still presently rated at a mild degree of AD dementia by results of her MMSE 24/ 30 and Dr Lajean testing , she has shown Moderate severity by [facility] score with more pronounced deficits in STM, recall- and deficits in semantic memory,  in repeating sentences and finding words. Orientation and calculation were good, executive function was preserved.    Brain MRI not showing vascular insults and no micro  bleeds.(CAA) ,  PET scan confirmed amyloid deposits,  ATN confirmed AD pathology.   I agree with the patient's husband and her that she would like to receive treatment rather earlier than later  and that both understood the potential for side effects, and we discussed the addition of MONOCLONAL AB THERAPY ANTI AMYLOID THERAPY  - One question is if drugs  like leqembi  or kisunla are helpful, as these may slow the disease progression.  I explained that these are potentially worth it for  individuals with early-stage Alzheimer's disease or mild cognitive impairment (MCI) with confirmed amyloid pathology, as clinical trials show it can slow disease progression, but not cure it.  The decision is individual and depends on weighing modest clinical benefits against substantial risks, like amyloid-related imaging abnormalities (ARIA), the drug's cost, the need for frequent monitoring (infusions, MRIs), and the logistics of treatment.  It is most beneficial when started early in the disease process, ideally before significant brain damage occurs.  There is also a subcutaneus injection form in the development  for Leqembi , and this would create easier access and adherence to the the therapy,  reducing also infusion related costs. We expect the side effects would be similar.   The patient and her husband want to start therapy.    Plan :  will start Lequembi ;Continue on Donepezil  ( 10 mg in AM) and Namenda  ( 10 mg bid po) , both refilled.   Repeat MRI after infusion started. Please check prescription benefits for the medication and if it can be infused here or at hospital.   2) Depression history- current mood is good but I want her to continue on her established and well tolerated medications for depression,  which consists of Effexor and Wellbutrin , and which she had discontinued. I am happy to refill Wellbutrin  today , but let Dr Alvia know about the interruption in therapy- and I have not been sure if we should restart Effexor (Venlafaxine) at all or at 20 mg after a hiatus ( because of interaction with MIRTAZEPINE Remeron ) .    3) evening agitation ; 'sun downing ' , Remeron  was prescribed as an antianxiety drug but can be used to initiate sleep. While it is usually meant for daily regular intake it can be used prn.  I have not refilled Effexor as it has potential side effects with Remeron .  4) establish and adhere to routines and schedules to create a framework for less variability, less uncertainly in day to day operations, thus lowering anxiety and potential for disorientation.  Continue your social activities, maintaining hobbies and interests that bring you joy.      I would like to thank Alvia Bring, DO and Norleen Asa, PhD ,  for allowing me to follow along with this pleasant patient.   The patient's condition requires frequent monitoring and adjustments in the treatment plan, reflecting the ongoing complexity of care.  This provider is the continuing focal point for all needed services for this condition.  After spending a total time of  35  minutes face to face and time for  history taking, physical and neurologic examination, review of laboratory studies,  discussion of treatment plan and risks and benefits of the medication available.  personal review of imaging studies, reports and results of other testing and review of referral information / records as far as provided in visit,   Electronically signed by: Dedra Gores, MD 08/18/2024 12:02 PM  [facility] and [facility] Board certified by  The [employer] of Sleep Medicine and Diplomate of the [employer] of Sleep Medicine. Board certified In Neurology through the ABPN, Fellow of the [employer] of Neurology.

## 2024-08-18 NOTE — Patient Instructions (Addendum)
 LEQEMBI  ; Kisunla:   MONOCLONAL AB THERAPY ANTI-AMYLOID therapy for early AD or MCI with Alzheimer's disease pathology:  Lecanemab  Injection What is this medication? LECANEMAB  (lek AN e mab) treats Alzheimer disease. It works by decreasing the buildup of amyloid, a protein that may cause Alzheimer disease. This may slow down the worsening of symptoms. It is a monoclonal antibody. This medicine may be used for other purposes; ask your health care provider or pharmacist if you have questions. COMMON BRAND NAME(S): LEQEMBI  What should I tell my care team before I take this medication? They need to know if you have any of these conditions: An unusual or allergic reaction to lecanemab , other medications, foods, dyes, or preservatives Take medications that treat or prevent blood clots Pregnant or trying to get pregnant Breast-feeding How should I use this medication? This medication is injected into a vein. It is given by your care team in a hospital or clinic setting. A special MedGuide will be given to you before each treatment. Be sure to read this information carefully each time. Talk to your care team about the use of this medication in children. Special care may be needed. Overdosage: If you think you have taken too much of this medicine contact a poison control center or emergency room at once. NOTE: This medicine is only for you. Do not share this medicine with others. What if I miss a dose? Keep appointments for follow-up doses. It is important not to miss your dose. Call your care team if you are unable to keep an appointment. What may interact with this medication? Interactions have not been studied. This list may not describe all possible interactions. Give your health care provider a list of all the medicines, herbs, non-prescription drugs, or dietary supplements you use. Also tell them if you smoke, drink alcohol, or use illegal drugs. Some items may interact with your  medicine.   What should I watch for while using this medication?   Visit your care team for regular checks on your progress. Tell your care team if your symptoms do not start to get better or if they get worse. What side effects may I notice from receiving this medication? Side effects that you should report to your care team as soon as possible: Allergic reactions or angioedema--skin rash, itching or hives, swelling of the face, eyes, lips, tongue, arms, or legs, trouble swallowing or breathing Headache, worsening confusion, dizziness, change in vision, nausea, seizures Infusion reactions--chest pain, shortness of breath or trouble breathing, feeling faint or lightheaded Side effects that usually do not require medical attention (report these to your care team if they continue or are bothersome): Cough Diarrhea Headache Confusion and disorientation  This list may not describe all possible side effects.   Call your doctor for medical advice about side effects. You may report side effects to FDA at 1-800-FDA-1088. Where should I keep my medication? This medication is given in a hospital or clinic. It will not be stored at home. NOTE: This sheet is a summary. It may not cover all possible information. If you have questions about this medicine, talk to your doctor, pharmacist, or health care provider.  2024 Elsevier/Gold Standard (2023-11-12 00:00:00) AMYLOID - like leqembi  or kisunla is potentially worth it for some individuals with early-stage Alzheimer's disease or mild cognitive impairment (MCI) with confirmed amyloid pathology, as clinical trials show it can slow disease progression, but not cure it.  The decision is individual and depends on weighing modest clinical benefits against  substantial risks, like amyloid-related imaging abnormalities (ARIA), the drug's cost, the need for frequent monitoring (infusions, MRIs), and the logistics of treatment. It is most beneficial when started  early in the disease process, ideally before significant brain damage occurs.

## 2024-08-18 NOTE — Addendum Note (Signed)
 Addended by: CHALICE SAUNAS on: 08/18/2024 02:36 PM   Modules accepted: Orders

## 2024-08-21 ENCOUNTER — Ambulatory Visit: Admitting: Neurology

## 2024-08-30 ENCOUNTER — Ambulatory Visit: Payer: Self-pay

## 2024-08-30 DIAGNOSIS — L280 Lichen simplex chronicus: Secondary | ICD-10-CM | POA: Diagnosis not present

## 2024-08-30 NOTE — Telephone Encounter (Addendum)
 This RN contacted patient to advise to hold imodium until speaking with provider to confirm safe with her medication regimen. Pt verbalized understanding. Confirmed date and time of OV tomorrow.  FYI Only or Action Required?: Action required by provider: update on patient condition.  Patient was last seen in primary care on 07/05/2024 by Alvia Bring, DO.  Called Nurse Triage reporting Diarrhea.  Symptoms began yesterday.  Interventions attempted: Other: tea.  Symptoms are: unchanged.  Triage Disposition: See Physician Within 24 Hours  Patient/caregiver understands and will follow disposition?: Yes Reason for Disposition  [1] MODERATE diarrhea (e.g., 4-6 times / day more than normal) AND [2] age > 70 years  Answer Assessment - Initial Assessment Questions Patient reports she has tried drinking tea for her symptoms. This RN recommended she try OTC imodium per package instructions and drink water instead of tea.   ED precautions reviewed including: black, tarry or coffee-grind appearance to the stool, blood, abdominal pain, fever. Pt verbalized understanding.  Scheduled an OV next day and advised patient that she may call to cancel at her discretion if her symptoms fully resolve.  1. DIARRHEA SEVERITY: How bad is the diarrhea? How many more stools have you had in the past 24 hours than normal?      3-4 bowel movements  2. ONSET: When did the diarrhea begin?      08/29/24  3. STOOL DESCRIPTION:  How loose or watery is the diarrhea? What is the stool color? Is there any blood or mucous in the stool?     Brown liquid  4. VOMITING: Are you also vomiting? If Yes, ask: How many times in the past 24 hours?      Denies  5. ABDOMEN PAIN: Are you having any abdomen pain? If Yes, ask: What does it feel like? (e.g., crampy, dull, intermittent, constant)      Denies  10. ANTIBIOTIC USE: Are you taking antibiotics now or have you taken antibiotics in the past 2  months?       Denies  11. OTHER SYMPTOMS: Do you have any other symptoms? (e.g., fever, blood in stool)       Denies  Protocols used: [facility] Copied from CRM #[id number]. Topic: Clinical - Red Word Triage >> Aug 30, 2024  1:49 PM Alfonso ORN wrote: Red Word that prompted transfer to Nurse Triage: uncontrolled diarrhea coming in liquid form

## 2024-08-30 NOTE — Telephone Encounter (Signed)
 This RN made 1st attempt to reach patient. Left message with call back number.    Message from Graeme ORN sent at 08/30/2024  1:20 PM EDT  Summary: DIARRHEA   Diarrhea - a couple time this morning. No other symptoms. Requesting something be called in.

## 2024-08-30 NOTE — Telephone Encounter (Signed)
 Patient scheduled for 08/31/2024 with Zada Palin, NP

## 2024-08-31 ENCOUNTER — Ambulatory Visit

## 2024-08-31 ENCOUNTER — Ambulatory Visit (INDEPENDENT_AMBULATORY_CARE_PROVIDER_SITE_OTHER): Admitting: Medical-Surgical

## 2024-08-31 ENCOUNTER — Encounter: Payer: Self-pay | Admitting: Medical-Surgical

## 2024-08-31 VITALS — BP 95/58 | HR 69 | Resp 20 | Ht 67.5 in | Wt 153.0 lb

## 2024-08-31 DIAGNOSIS — M25541 Pain in joints of right hand: Secondary | ICD-10-CM | POA: Diagnosis not present

## 2024-08-31 DIAGNOSIS — M79644 Pain in right finger(s): Secondary | ICD-10-CM

## 2024-08-31 DIAGNOSIS — M19041 Primary osteoarthritis, right hand: Secondary | ICD-10-CM | POA: Diagnosis not present

## 2024-08-31 DIAGNOSIS — M79641 Pain in right hand: Secondary | ICD-10-CM | POA: Diagnosis not present

## 2024-08-31 MED ORDER — LOVASTATIN 20 MG PO TABS: 20.0000 mg | ORAL_TABLET | Freq: Every day | ORAL | 3 refills | Status: AC

## 2024-08-31 MED ORDER — MELOXICAM 15 MG PO TABS: 15.0000 mg | ORAL_TABLET | Freq: Every day | ORAL | 0 refills | Status: DC

## 2024-08-31 MED ORDER — ONDANSETRON 4 MG PO TBDP: 4.0000 mg | ORAL_TABLET | Freq: Three times a day (TID) | ORAL | 0 refills | Status: DC | PRN

## 2024-08-31 NOTE — Progress Notes (Signed)
        Established patient visit  History of Present Illness   Discussed the use of AI scribe software for clinical note transcription with the patient, who gave verbal consent to proceed.  History of Present Illness   Cindy [name] is a 73 year old female who presents with hand and back pain. Her husband accompanies her and helps serve as historian.  Hand arthralgia - Pain localized to the PIP joint of the right thumb for approximately one month - Pain is tender and intermittent - Alleviated by wearing a fingerless glove and use of Tylenol  - Exacerbated by movement and hand use - No pain in other hand joints  Upper back pain - Pain located between the shoulder blades, present since the beginning of the year - Pain is distributed across the upper back - Associated with slouching posture - No specific treatments used to date - Open to home exercise interventions      Physical Exam   Physical Exam Vitals reviewed.  Constitutional:      General: She is not in acute distress.    Appearance: Normal appearance.  HENT:     Head: Normocephalic and atraumatic.  Cardiovascular:     Rate and Rhythm: Normal rate and regular rhythm.     Pulses: Normal pulses.     Heart sounds: Normal heart sounds. No murmur heard.    No friction rub. No gallop.  Pulmonary:     Effort: Pulmonary effort is normal. No respiratory distress.     Breath sounds: Normal breath sounds. No wheezing.  Musculoskeletal:     Right hand: Bony tenderness (PIP joint of thumb) present.  Skin:    General: Skin is warm and dry.  Neurological:     Mental Status: She is alert and oriented to person, place, and time.  Psychiatric:        Mood and Affect: Mood normal.        Behavior: Behavior normal.        Thought Content: Thought content normal.        Judgment: Judgment normal.    Assessment & Plan    Assessment and Plan    Right thumb joint pain  Right thumb joint pain for one month with tenderness  and swelling. Differential includes arthritis and synovitis. - Order x-ray of right hand to assess for arthritis or synovitis. - Consider topical Voltaren gel for anti-inflammatory effect. - Continue use of glove for support and swelling reduction.  Upper back pain between shoulder blades Upper back pain since the beginning of the year, possibly related to poor posture. - Provide exercises and stretches for neck and posture improvement. - Prescribe meloxicam  once daily for inflammation. - Advise to take meloxicam  with food to prevent stomach irritation. - Instruct to avo - Provide home exercises to improve posture and strengthen upper back muscles. - Educate on maintaining good posture and using supportive pillows.  Follow up   Return if symptoms worsen or fail to improve. __________________________________ Zada FREDRIK Palin, DNP, APRN, FNP-BC Primary Care and Sports Medicine [facility] [place]

## 2024-09-07 ENCOUNTER — Telehealth: Payer: Self-pay

## 2024-09-07 ENCOUNTER — Ambulatory Visit: Payer: Self-pay | Admitting: Medical-Surgical

## 2024-09-07 NOTE — Telephone Encounter (Signed)
 Order for Leqembi  infusion rec'd by [facility]. Pt was called by [facility] on 08/28/24 to schedule initial infusion appt. Pt stated she is still not sure and wants to discuss further with Dr. Chalice. Pt was advised by [facility] to call [facility] or send a [facility] message making provider aware. To date Pt has not called or sent a message. Provider made aware of Pt uncertainty of starting infusion.

## 2024-09-07 NOTE — Telephone Encounter (Signed)
 Spoke w/Pt regarding concerns about Leqembi  infusion. Pt stated she had endured cancer treatments in the past and was so sick she did not want that to happen again. Informed Pt we do not want her to experience that either and discussed some of the side effects, but we have not had many reports of [facility] pts on Leqembi  having serious side effects at this time. Pt stated she has also spoken with Dr. Chalice and she feels better about the infusions so she is ready to go. Informed Pt will make [facility] aware so they can get her scheduled. Pt voiced understanding and thanks for the call.  Made [facility] aware Pt is ready to be scheduled. [facility] able to schedule Pt on 10/2 for initial Leqembi  infusion.

## 2024-09-11 ENCOUNTER — Telehealth: Payer: Self-pay

## 2024-09-11 DIAGNOSIS — M79644 Pain in right finger(s): Secondary | ICD-10-CM

## 2024-09-11 DIAGNOSIS — M79641 Pain in right hand: Secondary | ICD-10-CM

## 2024-09-11 NOTE — Telephone Encounter (Signed)
 Copied from CRM [id number]. Topic: Clinical - Lab/Test Results >> Sep 11, 2024  1:20 PM Shanda MATSU wrote: Reason for CRM: Patient calling back in regards to call made to her about Xray results.

## 2024-09-11 NOTE — Telephone Encounter (Signed)
 Patient informed of x-ray results and recommendations. She is wanting to know if she should expect referral to PT or if she is to do home exercises?

## 2024-09-12 NOTE — Telephone Encounter (Signed)
 Referral placed to include bilateral hands and right thumb.  Thanks, Zada

## 2024-09-12 NOTE — Telephone Encounter (Signed)
 In speaking with patient - states her Left hand is now just about as bad as the right hand and thumb pain .  Can the referral be sent to bilateral hands and right thumb?

## 2024-09-13 NOTE — Telephone Encounter (Signed)
 Patient informed.

## 2024-09-14 DIAGNOSIS — G309 Alzheimer's disease, unspecified: Secondary | ICD-10-CM | POA: Diagnosis not present

## 2024-09-14 DIAGNOSIS — F03918 Unspecified dementia, unspecified severity, with other behavioral disturbance: Secondary | ICD-10-CM | POA: Diagnosis not present

## 2024-09-22 ENCOUNTER — Other Ambulatory Visit: Payer: Self-pay | Admitting: Neurology

## 2024-09-28 ENCOUNTER — Telehealth: Payer: Self-pay | Admitting: *Deleted

## 2024-09-28 DIAGNOSIS — G309 Alzheimer's disease, unspecified: Secondary | ICD-10-CM | POA: Diagnosis not present

## 2024-09-28 DIAGNOSIS — F03918 Unspecified dementia, unspecified severity, with other behavioral disturbance: Secondary | ICD-10-CM | POA: Diagnosis not present

## 2024-09-28 NOTE — Telephone Encounter (Signed)
 Received message from Roberdel with [facility].  Pt will need to have MRI prior to 10/12/2024 (read as well).  She started her infusion #1 09/14/2024.

## 2024-10-03 NOTE — Therapy (Addendum)
 " OUTPATIENT PHYSICAL THERAPY SHOULDER EVALUATION PHYSICAL THERAPY DISCHARGE SUMMARY  Visits from Start of Care: Eval only   Current functional level related to goals / functional outcomes: See evaluation    Remaining deficits: Unchanged    Education / Equipment: HEP    Patient agrees to discharge. Patient goals were met. Patient is being discharged due to meeting the stated rehab goals.   Valeri Sula P. Ina PT, MPH 12/20/2024 8:10 AM    Patient Name: Cindy [name] MRN: [id number] DOB:[birth year], 73 y.o., female Today's Date: 10/04/2024  END OF SESSION:  PT End of Session - 10/04/24 1127     Visit Number 1    Number of Visits 1    Date for Recertification  10/04/24    Authorization Type [employer] copay $15    PT Start Time 1015    PT Stop Time 1100    PT Time Calculation (min) 45 min    Activity Tolerance Patient tolerated treatment well          Past Medical History:  Diagnosis Date   Arthritis    Cancer (HCC)    breast   Depression    GERD (gastroesophageal reflux disease)    Hyperlipidemia    Tendonitis    Past Surgical History:  Procedure Laterality Date   BREAST SURGERY  2000   right   ELBOW SURGERY     right   Patient Active Problem List   Diagnosis Date Noted   Neurodegenerative cognitive impairment 05/26/2024   Family history of Alzheimer's disease 05/26/2024   Alzheimer's disease (HCC) 05/26/2024   Short-term memory loss 05/26/2024   Epistaxis 01/12/2024   Nausea 11/11/2023   Complicated UTI (urinary tract infection) 11/10/2023   UTI due to extended-spectrum beta lactamase (ESBL) producing Escherichia coli 11/10/2023   UTI (urinary tract infection) 10/12/2023   Intractable migraine without aura and without status migrainosus 10/06/2023   Intractable headache 09/27/2023   Rash, skin 06/10/2023   Mild cognitive impairment with memory loss 12/16/2022   Sebaceous cyst of labia 01/13/2022   MDD (major depressive disorder), recurrent  episode 05/23/2020   Blepharitis of both upper and lower eyelid 05/14/2020   Dermatochalasis of both upper eyelids 05/14/2020   Urinary frequency 11/16/2019   Cystitis 10/30/2019   Hypermetropia of both eyes 01/11/2018   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   Nuclear sclerotic cataract of both eyes 01/11/2018   PVD (posterior vitreous detachment), right 01/11/2018   Age-related nuclear cataract of both eyes 01/11/2018   Hyperlipidemia 10/20/2012   HSV-1 infection 10/20/2012   GERD (gastroesophageal reflux disease) 10/20/2012   Depression with anxiety 02/01/2009   BREAST CANCER, HX OF 08/26/2007    PCP: Dr Velma Ku   REFERRING PROVIDER: Zada Palin; NP  REFERRING DIAG: Pain of R thumb   THERAPY DIAG:  Thumb pain, right  Arthritis  Rationale for Evaluation and Treatment: Rehabilitation  ONSET DATE: 09/14/23   SUBJECTIVE:  SUBJECTIVE STATEMENT: Patient's husband reports patient has experienced R thumb pain for the past month with no known injury. She has pain in the R hand but no problems with the R hand. Symptoms have progressively worsened over the past year or more. Notices the thumb pain with overuse.  Hand dominance: Right  PERTINENT HISTORY: R elbow surgery; depression; GERD; hyperlipidemia; alzheimer's disease;  She has also had upper back pain for the past year which is attributed to slouched posture.   PAIN:  Are you having pain? Yes: NPRS scale: 2-3/10  Pain location: R thumb and hand  Pain description: tender  Aggravating factors: overuse with weeding Relieving factors: hot shower   PRECAUTIONS: None  RED FLAGS: None   WEIGHT BEARING RESTRICTIONS: No  FALLS:  Has patient fallen in last 6 months? 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  DIAGNOSTIC FINDINGS:  Xray R hand 09/07/24:  BONES AND JOINTS: Osteophyte formation and joint space narrowing of the thumb interphalangeal joint and second through fifth DIP and PIP joints consistent with osteoarthritis.   SOFT TISSUES: The soft tissues are unremarkable.   IMPRESSION: 1. Osteoarthritis involving the thumb interphalangeal joint and second through fifth DIP and PIP joints.  PATIENT SURVEYS:  Cognitively unable to complete   COGNITION: Overall cognitive status: History of cognitive impairments - at baseline     SENSATION: WFL  POSTURE: Patient presents with head forward posture with increased thoracic kyphosis; shoulders rounded and elevated; scapulae abducted and rotated along the thoracic spine; head of the humerus anterior in orientation.   UPPER EXTREMITY ROM: UE AROM WFL's bilat   Active ROM Right eval Left eval  Shoulder flexion    Shoulder extension    Shoulder abduction    Shoulder adduction    Shoulder internal rotation    Shoulder external rotation    Elbow flexion    Elbow extension    Wrist flexion    Wrist extension    Wrist ulnar deviation    Wrist radial deviation    Wrist pronation    Wrist supination    (Blank rows = not tested)  UPPER EXTREMITY MMT: UE strength WFL's bilat   MMT Right eval Left eval  Shoulder flexion    Shoulder extension    Shoulder abduction    Shoulder adduction    Shoulder internal rotation    Shoulder external rotation    Middle trapezius    Lower trapezius     Elbow flexion    Elbow extension    Wrist flexion    Wrist extension    Wrist ulnar deviation    Wrist radial deviation    Wrist pronation    Wrist supination    Grip strength (lbs)(3 trials) 55 50  (Blank rows = not tested)   JOINT MOBILITY TESTING:  WFL's bilat hands   PALPATION:  WFL's no tenderness noted with palpation through hands/R thumb  TREATMENT DATE: Evaluation findings; ergonomic resources; splinting options; joint protection website; AROM wrists and hands; answered questions from patient and husband re-evaluation and recommendations providing written suggestions and instructions    PATIENT EDUCATION: Education details: POC; HEP  Person educated: Patient and Spouse Education method: Explanation, Demonstration, Tactile cues, Verbal cues, and Handouts Education comprehension: verbalized understanding, returned demonstration, verbal cues required, tactile cues required, and needs further education  HOME EXERCISE PROGRAM: Access Code: [email or web address] URL: [email or web address] Date: 10/04/2024 Prepared by: Adianna Darwin  Exercises - Wrist Prayer Stretch at Table  - 2 x daily - 7 x weekly - 1 sets - 5 reps - 5 sec  hold - Seated Reverse Wrist Prayer Stretch  - 2 x daily - 7 x weekly - 1 sets - 5 reps - 5 sec  hold - Finger Spreading  - 2 x daily - 7 x weekly - 1 sets - 10 reps - 1-2 sec  hold - Finger MP Flexion AROM  - 2 x daily - 7 x weekly - 1 sets - 5 reps -   hold - Thumb AROM Opposition  - 2 x daily - 7 x weekly - 1 sets - 3 reps - 30 sec  hold - Thumb Radial Adduction with Thumb Flexion AROM on Table  - 2 x daily - 7 x weekly - 1 sets - 5 reps - 5 sec  hold  ASSESSMENT:  CLINICAL IMPRESSION: Patient is a 73 y.o. female who was seen today for physical therapy evaluation and treatment for R thumb pain and R > L hand pain. Patient  presents with no pain today but reports tenderness primarily in the R thumb with overuse. UE ROM and strength WFL's throughout. Provided patient and husband with resources to decrease pain or tenderness when irritated; splinting options to use when working in yard; primary school teacher website for those with hand arthritis; exercises.    OBJECTIVE IMPAIRMENTS: decreased activity tolerance, impaired UE functional use, improper body mechanics, and pain.   ACTIVITY LIMITATIONS: carrying and lifting  PARTICIPATION LIMITATIONS: meal prep, cleaning, laundry, and yard work  PERSONAL FACTORS: Past/current experiences, Profession, Time since onset of injury/illness/exacerbation, and comorbidities as noted above are also affecting patient's functional outcome.   REHAB POTENTIAL: Good  CLINICAL DECISION MAKING: Evolving/moderate complexity  EVALUATION COMPLEXITY: Moderate   GOALS: Goals reviewed with patient? Yes  SHORT TERM GOALS: Target date: 10/04/24  Independent in HEP with husband's assist  Baseline: Goal status: met  2.  Provided patient and husband with resources to decrease pain or tenderness when irritated; splinting options to use when working in yard; primary school teacher website for those with hand arthritis Baseline:  Goal status: met  PLAN:  PT FREQUENCY: one time visit  PT DURATION: other: one time only visit   PLANNED INTERVENTIONS: 97164- PT Re-evaluation, 97110-Therapeutic exercises, 97530- Therapeutic activity, 97112- Neuromuscular re-education, 97535- Self Care, [id number]- Manual therapy, Patient/Family education, Taping, and Joint mobilization  PLAN FOR NEXT SESSION: patient and husband will call with any questions    Arma Reining SHAUNNA Baptist, PT 10/04/2024, 11:30 AM  "

## 2024-10-03 NOTE — Telephone Encounter (Signed)
 MRI order sent to [facility] to schedule, needs to be done and read by 10/30. [phone]

## 2024-10-04 ENCOUNTER — Encounter: Payer: Self-pay | Admitting: Rehabilitative and Restorative Service Providers"

## 2024-10-04 ENCOUNTER — Other Ambulatory Visit: Payer: Self-pay

## 2024-10-04 ENCOUNTER — Ambulatory Visit: Attending: Medical-Surgical | Admitting: Rehabilitative and Restorative Service Providers"

## 2024-10-04 DIAGNOSIS — M79642 Pain in left hand: Secondary | ICD-10-CM | POA: Diagnosis not present

## 2024-10-04 DIAGNOSIS — M79644 Pain in right finger(s): Secondary | ICD-10-CM | POA: Insufficient documentation

## 2024-10-04 DIAGNOSIS — M1990 Unspecified osteoarthritis, unspecified site: Secondary | ICD-10-CM | POA: Insufficient documentation

## 2024-10-04 DIAGNOSIS — M79641 Pain in right hand: Secondary | ICD-10-CM | POA: Diagnosis not present

## 2024-10-07 ENCOUNTER — Other Ambulatory Visit: Payer: Self-pay | Admitting: Family Medicine

## 2024-10-09 ENCOUNTER — Ambulatory Visit (HOSPITAL_COMMUNITY)
Admission: RE | Admit: 2024-10-09 | Discharge: 2024-10-09 | Disposition: A | Discharge status: Home / Self Care | Source: Ambulatory Visit | Attending: Neurology | Admitting: Neurology

## 2024-10-09 DIAGNOSIS — G309 Alzheimer's disease, unspecified: Secondary | ICD-10-CM | POA: Diagnosis not present

## 2024-10-09 DIAGNOSIS — R9082 White matter disease, unspecified: Secondary | ICD-10-CM | POA: Diagnosis not present

## 2024-10-09 MED ORDER — GADOBUTROL 1 MMOL/ML IV SOLN
7.0000 mL | Freq: Once | INTRAVENOUS | Status: AC | PRN
Start: 2024-10-09 — End: 2024-10-09
  Administered 2024-10-09: 7 mL via INTRAVENOUS

## 2024-10-11 ENCOUNTER — Telehealth: Payer: Self-pay | Admitting: Neurology

## 2024-10-11 NOTE — Telephone Encounter (Signed)
 I was asked to review this MRI performed at [facility] so that the patient can have her next infusion of lecanemab  scheduled tomorrow  This study shows chronic microvascular ischemic changes.  No acute findings.    No evidence of amyloid related imaging abnormalities.  No microhemorrhages.  This study is unchanged compared to the 06/24/2024 MRI.  The patient should be able to do her scheduled infusion

## 2024-10-12 DIAGNOSIS — F03918 Unspecified dementia, unspecified severity, with other behavioral disturbance: Secondary | ICD-10-CM | POA: Diagnosis not present

## 2024-10-12 DIAGNOSIS — Z006 Encounter for examination for normal comparison and control in clinical research program: Secondary | ICD-10-CM | POA: Diagnosis not present

## 2024-10-12 DIAGNOSIS — G309 Alzheimer's disease, unspecified: Secondary | ICD-10-CM | POA: Diagnosis not present

## 2024-10-16 ENCOUNTER — Ambulatory Visit: Payer: Self-pay | Admitting: Neurology

## 2024-10-17 NOTE — Telephone Encounter (Signed)
 Contacted pt, informed her All normal MRI findings, unchanged, no evidence of ARIA ( no microbleeds in amyloid patients) . Should continue infusion therapy.  Advised to call the office back with any additional questions or concerns that she had none at this time.  Patient verbally understood results and was appreciative.

## 2024-10-26 DIAGNOSIS — G309 Alzheimer's disease, unspecified: Secondary | ICD-10-CM | POA: Diagnosis not present

## 2024-10-26 DIAGNOSIS — Z006 Encounter for examination for normal comparison and control in clinical research program: Secondary | ICD-10-CM | POA: Diagnosis not present

## 2024-10-26 DIAGNOSIS — F03918 Unspecified dementia, unspecified severity, with other behavioral disturbance: Secondary | ICD-10-CM | POA: Diagnosis not present

## 2024-10-30 ENCOUNTER — Telehealth: Payer: Self-pay | Admitting: Family Medicine

## 2024-10-30 NOTE — Telephone Encounter (Signed)
 Copied from CRM #[id number]. Topic: Clinical - Refused Triage >> Oct 30, 2024 10:45 AM Delon HERO wrote: Patient's husband is calling to report that the patient has lost 20-30lbs. And is having pain in hand after being sent to specialist  . Declined transfer to triage. Requesting to schedule appointment with Dr. Will only.   Routed to E2C2 NT in error. Routing to PCP office.

## 2024-10-30 NOTE — Telephone Encounter (Signed)
 The patient has been scheduled to see the provider on 10/31/24 at 1030 am for the following concerns.

## 2024-10-30 NOTE — Telephone Encounter (Unsigned)
 Copied from CRM #[id number]. Topic: Clinical - Refused Triage >> Oct 30, 2024 10:45 AM Delon HERO wrote: Patient's husband is calling to report that the patient has lost 20-30lbs. And is having pain in hand after being sent to specialist  . Declined transfer to triage. Requesting to schedule appointment with Dr. Will only.

## 2024-10-31 ENCOUNTER — Ambulatory Visit: Admitting: Family Medicine

## 2024-11-01 ENCOUNTER — Ambulatory Visit: Admitting: Family Medicine

## 2024-11-01 ENCOUNTER — Encounter: Payer: Self-pay | Admitting: Family Medicine

## 2024-11-01 VITALS — BP 130/83 | HR 96 | Ht 67.5 in | Wt 137.0 lb

## 2024-11-01 DIAGNOSIS — R634 Abnormal weight loss: Secondary | ICD-10-CM | POA: Insufficient documentation

## 2024-11-01 NOTE — Patient Instructions (Signed)
 -Stop bupropion   -Take paroxetine  daily.   -Take mirtazapine  each night.   -Try to increase protein and calorie intake.  Shakes such as Information Systems Manager can help supplement this.   High-Protein and High-Calorie Diet Eating high-protein and high-calorie foods can help you to gain weight, heal after an injury, and get better after an illness or surgery. The amount of daily protein and calories you need depends on: Your body weight. The reason you were told to follow this diet. Usually, a high-protein, high-calorie diet means that you should: Eat 250-500 extra calories each day. Make sure that you get enough of your daily calories from protein. Ask your health care provider how many of your calories should come from protein and how many calories total you need each day. Follow the diet as told by your provider. What are tips for following this plan? Reading food labels Check the nutrition facts label for calories, and grams of fat and protein. Items with more than 4 grams of protein are high-protein foods. General information  Ask your provider if you should take a nutritional supplement. Try to eat six small meals each day instead of three large meals. A goal is usually to eat every 2 to 3 hours. Eat a balanced diet. In each meal, include one food that's high in protein and one food with fat in it. Keep nutritious snacks available, such as nuts, trail mixes, dried fruit, and whole-milk yogurt. If you have kidney disease or diabetes, talk with your provider about how much protein is safe for you. Too much protein may put extra stress on your kidneys. Replace zero-calorie drinks with drinks that have calories in them, such as milk and 100% fruit juice. Consider setting a timer to remind you to eat. You'll want to eat even if you do not feel very hungry. Preparing meals Milk and dairy foods. Add whole milk, half-and-half, or heavy cream to cereal, pudding, soup, or hot  cocoa. Add whole milk to instant breakfast drinks. Add powdered milk to baked goods, smoothies, or milkshakes. Add powdered milk, cream, or butter to mashed potatoes. Replace water with milk or heavy cream when making foods such as oatmeal, pudding, or cocoa. Make cream-based pastas and soups. Add cheese to cooked vegetables. Make whole-milk yogurt parfaits. Top them with granola, fruit, or nuts. Add cottage cheese to fruit. Add cream cheese to sandwiches or as a topping on crackers and bread. Eggs. Add hard-boiled eggs to salads. Keep hard-boiled eggs in the fridge to snack on. Add cheese to cooked eggs. Beans, nuts, and seeds. Add peanut butter to oatmeal or smoothies. Use peanut butter as a dip for fruits and vegetables or as a topping for pretzels, celery, or crackers. Add beans to casseroles, dips, and spreads. Add pureed beans to sauces and soups. Salads, soups, and other foods. Add avocado, cheese, or both to sandwiches or salads. Add avocado to smoothies. Add meat, poultry, or seafood to rice, pasta, casseroles, salads, and soups. Use mayonnaise when making egg salad, chicken salad, or tuna salad. Add oil or butter to cooked vegetables and grains. What high-protein foods should I eat?  Vegetables Soybeans. Peas. Grains Quinoa. Bulgur wheat. Buckwheat. Meats and other proteins Beef, pork, and poultry. Fish and seafood. Eggs. Tofu. Textured vegetable protein (TVP). Peanut butter. Nuts and seeds. Dried beans. Protein powders. Hummus. Jerky. Dairy Whole milk. Whole-milk yogurt. Powdered milk. Cheese. Cottage cheese. Eggnog. Beverages High-protein supplement drinks. Soy milk. Other foods Protein bars. The items listed  above may not be all the foods and drinks you can have. Talk with an expert in healthy eating called a dietitian to learn more. What high-calorie foods should I eat? Fruits Dried fruit. Fruit leather. Canned fruit in syrup. Fruit juice.  Avocado. Vegetables Vegetables cooked in oil or butter. Fried potatoes. Grains Pasta. Quick breads. Muffins. Pancakes. Granola. Meats and other proteins Peanut butter and other nut butters. Nuts and seeds. Dairy Heavy cream. Whipped cream. Cream cheese. Sour cream. Ice cream. Custard. Pudding. Whole-milk dairy products. Beverages Meal-replacement beverages. Nutrition shakes. Fruit juice. Seasonings and condiments Salad dressing. Mayonnaise. Alfredo sauce. Fruit preserves or jelly. Honey. Syrup. Sweets and desserts Cake. Cookies. Pie. Pastries. Candy bars. Chocolate. Fats and oils Butter or margarine. Oil. Gravy. Other foods Meal-replacement bars. The items listed above may not be all the foods and drinks you can have. Talk with an expert in healthy eating to learn more. This information is not intended to replace advice given to you by your health care provider. Make sure you discuss any questions you have with your health care provider. Document Revised: 04/26/2023 Document Reviewed: 04/26/2023 Elsevier Patient Education  2024 [employer].

## 2024-11-01 NOTE — Assessment & Plan Note (Signed)
 Seems related to decreased appetite.  Will have her discontinue bupropion  as this may be contributing.  Recommend taking mirtazapine  regularly each night.  Start paroxetine  as well.  Discussed trying to increase intake of calorie dense foods as well as higher protein intake.

## 2024-11-01 NOTE — Progress Notes (Signed)
 Cindy [name] last seen in clinic season [place] Cindy [name] - 73 y.o. female MRN [id number]  Date of birth: April 09, 1951  Subjective Chief Complaint  Patient presents with   Unintentional Weight Loss   Cyst    HPI Cindy [name] is a 73 y.o. female here today for follow-up.  No concerns about weight loss.  She has had weight loss of about 15.  Pounds over the past few months.  She reports that she does not have much of an appetite.  Denies any abdominal pain, nausea or vomiting.  No dysphagia noted.  Bowels are moving normally.  She is also seeing neurology for management of her Alzheimer's dementia.  She is on Leqembi .  She is prescribed combination of bupropion , mirtazapine  and paroxetine .  Feels like depression and anxiety are well-controlled.  She has not been taking mirtazapine  too often.  ROS:  A comprehensive ROS was completed and negative except as noted per HPI  No Known Allergies  Past Medical History:  Diagnosis Date   Arthritis    Cancer (HCC)    breast   Depression    GERD (gastroesophageal reflux disease)    Hyperlipidemia    Tendonitis     Past Surgical History:  Procedure Laterality Date   BREAST SURGERY  2000   right   ELBOW SURGERY     right    Social History   Socioeconomic History   Marital status: Married    Spouse name: Todd   Number of children: 0   Years of education: 18   Highest education level: Master's degree (e.g., MA, MS, MEng, MEd, MSW, MBA)  Occupational History   Occupation: Retired.   Occupation: retired  Tobacco Use   Smoking status: Never   Smokeless tobacco: Never  Vaping Use   Vaping status: Never Used  Substance and Sexual Activity   Alcohol use: Not Currently    Comment: rarely   Drug use: No   Sexual activity: Yes    Birth control/protection: Post-menopausal  Other Topics Concern   Not on file  Social History Narrative   Lives with her husband. They help take care of her brother's grandchildren. She enjoys  singing in the choir and gardening.   Social Drivers of Corporate Investment Banker Strain: Low Risk  (12/28/2022)   Overall Financial Resource Strain (CARDIA)    Difficulty of Paying Living Expenses: Not hard at all  Food Insecurity: No Food Insecurity (11/10/2023)   Hunger Vital Sign    Worried About Running Out of Food in the Last Year: Never true    Ran Out of Food in the Last Year: Never true  Transportation Needs: No Transportation Needs (11/10/2023)   PRAPARE - Administrator, Civil Service (Medical): No    Lack of Transportation (Non-Medical): No  Physical Activity: Inactive (12/28/2022)   Exercise Vital Sign    Days of Exercise per Week: 0 days    Minutes of Exercise per Session: 0 min  Stress: No Stress Concern Present (12/28/2022)   [employer] of Occupational Health - Occupational Stress Questionnaire    Feeling of Stress : Not at all  Social Connections: Socially Integrated (12/28/2022)   Social Connection and Isolation Panel    Frequency of Communication with Friends and Family: More than three times a week    Frequency of Social Gatherings with Friends and Family: More than three times a week    Attends Religious Services: More than 4 times per year  Active Member of Clubs or Organizations: Yes    Attends Banker Meetings: More than 4 times per year    Marital Status: Married    Family History  Problem Relation Age of Onset   Cancer Mother        bone   Hypertension Father    Diabetes Father    Stroke Father    Hypertension Sister    Hypertension Sister    Alzheimer's disease Brother    Colon cancer Neg Hx    Esophageal cancer Neg Hx    Rectal cancer Neg Hx    Stomach cancer Neg Hx     Health Maintenance  Topic Date Due   Medicare Annual Wellness (AWV)  12/01/2024 (Originally 12/29/2023)   Influenza Vaccine  03/13/2025 (Originally 07/14/2024)   COVID-19 Vaccine (7 - 2025-26 season) 11/17/2025 (Originally 08/14/2024)    Mammogram  07/26/2025   DTaP/Tdap/Td (4 - Td or Tdap) 04/05/2028   Colonoscopy  08/28/2029   Pneumococcal Vaccine: 50+ Years  Completed   Bone Density Scan  Completed   Hepatitis C Screening  Completed   Zoster Vaccines- Shingrix  Completed   Meningococcal B Vaccine  Aged Out   Hepatitis B Vaccines 19-59 Average Risk  Discontinued     ----------------------------------------------------------------------------------------------------------------------------------------------------------------------------------------------------------------- Physical Exam BP 130/83 (BP Location: Right Arm, Patient Position: Sitting, Cuff Size: Normal)   Pulse 96   Ht 5' 7.5 (1.715 m)   Wt 137 lb (62.1 kg)   SpO2 99%   BMI 21.14 kg/m   Physical Exam Constitutional:      Appearance: Normal appearance.  HENT:     Head: Normocephalic and atraumatic.  Eyes:     General: No scleral icterus. Cardiovascular:     Rate and Rhythm: Normal rate and regular rhythm.  Pulmonary:     Effort: Pulmonary effort is normal.     Breath sounds: Normal breath sounds.  Abdominal:     General: There is no distension.     Palpations: Abdomen is soft.     Tenderness: There is no abdominal tenderness.  Musculoskeletal:     Cervical back: Neck supple.  Neurological:     Mental Status: She is alert.  Psychiatric:        Mood and Affect: Mood normal.        Behavior: Behavior normal.     ------------------------------------------------------------------------------------------------------------------------------------------------------------------------------------------------------------------- Assessment and Plan  Abnormal weight loss Seems related to decreased appetite.  Will have her discontinue bupropion  as this may be contributing.  Recommend taking mirtazapine  regularly each night.  Start paroxetine  as well.  Discussed trying to increase intake of calorie dense foods as well as higher protein  intake.   No orders of the defined types were placed in this encounter.   Return in about 4 weeks (around 11/29/2024) for weight loss.

## 2024-11-06 ENCOUNTER — Ambulatory Visit: Payer: Self-pay

## 2024-11-06 ENCOUNTER — Ambulatory Visit: Admitting: Family Medicine

## 2024-11-06 NOTE — Telephone Encounter (Signed)
 Patient scheduled.

## 2024-11-06 NOTE — Telephone Encounter (Signed)
 FYI Only or Action Required?: Action required by provider: Pt offered OV today with NP and both pt and husband adamantly declined stating she would only see an MD. Advised UC/ED as alternate option as pt/spouse seem very concerned, they also decline this. Advised I was not able to schedule with anyone but NP today. Pt is asking to be worked in with Dr. Alvia today. This RN educated pt on home care, new-worsening symptoms, when to call back/seek emergent care. Pt verbalized understanding and agrees to plan.   Patient was last seen in primary care on 11/01/2024 by Cindy Bring, DO.  Called Nurse Triage reporting Vaginal Discharge.  Symptoms began several days ago.  Interventions attempted: Nothing.  Symptoms are: gradually worsening.  Triage Disposition: See HCP Within 4 Hours (Or PCP Triage)  Patient/caregiver understands and will follow disposition?: No, wishes to speak with PCP    Copied from CRM #[id number]. Topic: Clinical - Red Word Triage >> Nov 06, 2024  8:04 AM Cindy [name] wrote: [facility] that prompted transfer to Nurse Triage: Left labia severe pain/ swollen and now draining. Reason for Disposition  [1] Genital area looks infected (e.g., draining sore, spreading redness) AND [2] fever  Answer Assessment - Initial Assessment Questions 1. SYMPTOM: What's the main symptom you're concerned about? (e.g., pain, itching, dryness)     Left labial swelling,  2. LOCATION: Where is the swelling located? (e.g., inside/outside, left/right)     Left labia 3. ONSET: When did the swelling  start?     Saturday afternoon 4. PAIN: Is there any pain? If Yes, ask: How bad is it? (Scale: 1-10; mild, moderate, severe)     Moderate 5. ITCHING: Is there any itching? If Yes, ask: How bad is it? (Scale: 1-10; mild, moderate, severe)     None 6. CAUSE: What do you think is causing the discharge? Have you had the same problem before? What happened then?     Unknown 7. OTHER  SYMPTOMS: Do you have any other symptoms? (e.g., fever, itching, vaginal bleeding, pain with urination, injury to genital area, vaginal foreign body)     Painful, hard area, serosanguinous drainage this AM  Protocols used: Vaginal Symptoms-A-AH

## 2024-11-07 ENCOUNTER — Telehealth: Payer: Self-pay | Admitting: Family Medicine

## 2024-11-07 ENCOUNTER — Ambulatory Visit: Admitting: Adult Health

## 2024-11-07 ENCOUNTER — Other Ambulatory Visit: Payer: Self-pay

## 2024-11-07 ENCOUNTER — Ambulatory Visit
Admission: EM | Admit: 2024-11-07 | Discharge: 2024-11-07 | Disposition: A | Discharge status: Home / Self Care | Attending: Family Medicine | Admitting: Family Medicine

## 2024-11-07 ENCOUNTER — Ambulatory Visit: Admitting: Family Medicine

## 2024-11-07 DIAGNOSIS — L02214 Cutaneous abscess of groin: Secondary | ICD-10-CM | POA: Diagnosis not present

## 2024-11-07 MED ORDER — DOXYCYCLINE HYCLATE 100 MG PO CAPS: 100.0000 mg | ORAL_CAPSULE | Freq: Two times a day (BID) | ORAL | 0 refills | Status: AC

## 2024-11-07 MED ORDER — FLUCONAZOLE 150 MG PO TABS: ORAL_TABLET | ORAL | 0 refills | Status: AC

## 2024-11-07 NOTE — ED Triage Notes (Addendum)
 Has c/o left groin abscess x couple of weeks but last since the beginning of last week. No fever.  has been using sitz baths. Saw Dr. Alvia last week. Took tylenol  a couple of times. Needs refill for wellbutrin .

## 2024-11-07 NOTE — ED Provider Notes (Signed)
 Cindy [name]    CSN: [id number] Arrival date & time: 11/07/24  1222      History   Chief Complaint Chief Complaint  Patient presents with   Abscess    HPI Cindy [name] is a 73 y.o. female.   HPI 73 year old female presents with palpable abscess of left groin for 3 weeks.  PMH significant for breast cancer, depression, and HLD.  Patient is accompanied by her husband today.  Past Medical History:  Diagnosis Date   Arthritis    Cancer (HCC)    breast   Depression    GERD (gastroesophageal reflux disease)    Hyperlipidemia    Tendonitis     Patient Active Problem List   Diagnosis Date Noted   Abnormal weight loss 11/01/2024   Neurodegenerative cognitive impairment 05/26/2024   Family history of Alzheimer's disease 05/26/2024   Alzheimer's disease (HCC) 05/26/2024   Short-term memory loss 05/26/2024   Epistaxis 01/12/2024   Nausea 11/11/2023   Complicated UTI (urinary tract infection) 11/10/2023   UTI due to extended-spectrum beta lactamase (ESBL) producing Escherichia coli 11/10/2023   UTI (urinary tract infection) 10/12/2023   Intractable migraine without aura and without status migrainosus 10/06/2023   Intractable headache 09/27/2023   Rash, skin 06/10/2023   Mild cognitive impairment with memory loss 12/16/2022   Sebaceous cyst of labia 01/13/2022   MDD (major depressive disorder), recurrent episode 05/23/2020   Blepharitis of both upper and lower eyelid 05/14/2020   Dermatochalasis of both upper eyelids 05/14/2020   Urinary frequency 11/16/2019   Cystitis 10/30/2019   Hypermetropia of both eyes 01/11/2018   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      Home Medications    Prior to Admission medications   Medication Sig Start Date End Date Taking? Authorizing Provider  doxycycline  (VIBRAMYCIN ) 100 MG capsule Take 1 capsule (100 mg total) by mouth 2 (two) times daily for 10 days. 11/07/24 11/17/24 Yes Teddy Sharper, FNP  fluconazole  (DIFLUCAN ) 150 MG tablet Take 1 tab p.o. for vaginal candidiasis, may repeat 1 tab p.o. in 3 days if symptoms are not resolved. 11/07/24  Yes Teddy Sharper, FNP  Calcium Carbonate-Vit D-Min (CALCIUM 1200 PO) Take 1 tablet by mouth daily.    [provider]  clonazePAM  (KLONOPIN ) 1 MG tablet TAKE 1 TABLET BY MOUTH 2 TIMES DAILY AS NEEDED. 08/24/24   Alvia Bring, DO  donepezil  (ARICEPT ) 10 MG tablet Take 1 tablet (10 mg total) by mouth daily. 08/18/24   Dohmeier, Dedra, MD  famotidine  (PEPCID ) 20 MG tablet TAKE 1 TABLET BY MOUTH TWICE A DAY 10/09/24   Alvia Bring, DO  Lecanemab -irmb (LEQEMBI ) 200 MG/2ML SOLN Infusion at [facility] 08/18/24   Dohmeier, Dedra, MD  lovastatin  (MEVACOR ) 20 MG tablet Take 1 tablet (20 mg total) by mouth at bedtime. 08/31/24   Willo Mini, NP  meloxicam  (MOBIC ) 15 MG tablet Take 1 tablet (15 mg total) by mouth daily. 08/31/24   Jessup,  Joy, NP  memantine  (NAMENDA ) 10 MG tablet TAKE 1 TABLET BY MOUTH TWICE A DAY 07/20/24   Dohmeier, Dedra, MD  mirtazapine  (REMERON ) 7.5 MG tablet Take 1 tablet (7.5 mg total) by mouth at bedtime as needed. Helps with sleep prn 08/18/24   Dohmeier, Dedra, MD  Multiple Vitamin (MULTIVITAMIN) tablet Take 1 tablet by mouth daily.    [provider]  ondansetron  (ZOFRAN -ODT) 4 MG disintegrating tablet Take 1 tablet (4 mg total) by mouth every 8 (eight) hours as needed for nausea or vomiting. 08/31/24   Willo Mini, NP  PARoxetine   (PAXIL ) 20 MG tablet Take 40 mg by mouth daily. 09/14/24   [provider]  Rimegepant Sulfate (NURTEC) 75 MG TBDP Take 1 tablet (75 mg total) by mouth daily as needed (migraine). 12/27/23   Alvia Bring, DO  SUMAtriptan  (IMITREX ) 25 MG tablet Take 1 tablet (25 mg total) by mouth every 2 (two) hours as needed for migraine. May repeat in 2 hours if headache persists or recurs. 12/27/23   Alvia Bring, DO  trimethoprim  (TRIMPEX ) 100 MG tablet TAKE 1 TABLET EVERY DAY 07/05/23   Alvia Bring, DO    Family History Family History  Problem Relation Age of Onset   Cancer Mother        bone   Hypertension Father    Diabetes Father    Stroke Father    Hypertension Sister    Hypertension Sister    Alzheimer's disease Brother    Colon cancer Neg Hx    Esophageal cancer Neg Hx    Rectal cancer Neg Hx    Stomach cancer Neg Hx     Social History Social History   Tobacco Use   Smoking status: Never   Smokeless tobacco: Never  Vaping Use   Vaping status: Never Used  Substance Use Topics   Alcohol use: Not Currently    Comment: rarely   Drug use: No     Allergies   Patient has no known allergies.   Review of Systems Review of Systems  Skin:        Cyst of left groin for 3 weeks  All other systems reviewed and are negative.    Physical Exam Triage Vital Signs ED Triage Vitals  Encounter Vitals Group     BP      Girls Systolic BP Percentile      Girls Diastolic BP Percentile      Boys Systolic BP Percentile      Boys Diastolic BP Percentile      Pulse      Resp      Temp      Temp src      SpO2      Weight      Height      Head Circumference      Peak Flow      Pain Score      Pain Loc      Pain Education      Exclude from Growth Chart    No data found.  Updated Vital Signs BP 121/65   Pulse 84   Temp 98.6 F (37 C)   Resp 16   SpO2 95%    Physical Exam Vitals and nursing note reviewed. Exam conducted with a chaperone present.   Constitutional:      Appearance: Normal appearance. She is normal weight.  HENT:     Head: Normocephalic and atraumatic.     Mouth/Throat:     Mouth:  Mucous membranes are moist.     Pharynx: Oropharynx is clear.  Eyes:     Extraocular Movements: Extraocular movements intact.     Conjunctiva/sclera: Conjunctivae normal.     Pupils: Pupils are equal, round, and reactive to light.  Cardiovascular:     Rate and Rhythm: Normal rate and regular rhythm.     Heart sounds: Normal heart sounds.  Pulmonary:     Effort: Pulmonary effort is normal.     Breath sounds: Normal breath sounds. No wheezing, rhonchi or rales.  Genitourinary:     Comments: [place]nut (4.0 cm x 3.0 cm) sized non-indurated, non-erythematous cyst noted Musculoskeletal:        General: Normal range of motion.  Skin:    General: Skin is warm and dry.  Neurological:     General: No focal deficit present.     Mental Status: She is alert and oriented to person, place, and time. Mental status is at baseline.  Psychiatric:        Mood and Affect: Mood normal.        Behavior: Behavior normal.      UC Treatments / Results  Labs (all labs ordered are listed, but only abnormal results are displayed) Labs Reviewed - No data to display  EKG   Radiology No results found.  Procedures Procedures (including critical [name] time)  Medications Ordered in UC Medications - No data to display  Initial Impression / Assessment and Plan / UC Course  I have reviewed the triage vital signs and the nursing notes.  Pertinent labs & imaging results that were available during my [name] of the patient were reviewed by me and considered in my medical decision making (see chart for details).     MDM: 1.  Abscess of groin, left-Rx'd doxycycline  100 mg capsule, take 1 capsule twice daily x 10 days. Patient take medication as directed with food to completion.  Encouraged to increase daily water intake to 64 ounces per day while taking this  medication.  Advised if symptoms worsen and/or unresolved please follow-up with your PCP or general surgery for further evaluation.  Patient discharged home, hemodynamically stable. Final Clinical Impressions(s) / UC Diagnoses   Final diagnoses:  Abscess of groin, left     Discharge Instructions      Patient take medication as directed with food to completion.  Encouraged to increase daily water intake to 64 ounces per day while taking this medication.  Advised if symptoms worsen and/or unresolved please follow-up with your PCP or general surgery for further evaluation.     ED Prescriptions     Medication Sig Dispense Auth. Provider   doxycycline  (VIBRAMYCIN ) 100 MG capsule Take 1 capsule (100 mg total) by mouth 2 (two) times daily for 10 days. 20 capsule Margurete Guaman, FNP   fluconazole  (DIFLUCAN ) 150 MG tablet Take 1 tab p.o. for vaginal candidiasis, may repeat 1 tab p.o. in 3 days if symptoms are not resolved. 5 tablet Sherryl Valido, FNP      PDMP not reviewed this encounter.   Teddy Sharper, FNP 11/07/24 1345

## 2024-11-07 NOTE — Discharge Instructions (Addendum)
 Patient take medication as directed with food to completion.  Encouraged to increase daily water intake to 64 ounces per day while taking this medication.  Advised if symptoms worsen and/or unresolved please follow-up with your PCP or general surgery for further evaluation.

## 2024-11-07 NOTE — Telephone Encounter (Signed)
 Copied from CRM #[id number]. Topic: Clinical - Prescription Issue >> Nov 07, 2024  4:41 PM Shanda MATSU wrote: Reason for CRM: Patient calling in regards to issue she is having trying to get med, bupropion  filled, patient stated that pharmacy is adving her that med has already been filled, but patient stated this is not accurate, is req a call back.

## 2024-11-08 ENCOUNTER — Telehealth: Payer: Self-pay | Admitting: Family Medicine

## 2024-11-08 NOTE — Telephone Encounter (Signed)
 LM for pt that I didn't see the bupropion  on her med list nor where it was written by Dr. Donnice and to return call for more information

## 2024-11-08 NOTE — Telephone Encounter (Unsigned)
 Copied from CRM [id number]. Topic: Clinical - Medication Refill >> Nov 08, 2024 12:22 PM Zebedee SAUNDERS wrote: Medication: bupropion  150 mg  Has the patient contacted their pharmacy? Yes (Agent: If no, request that the patient contact the pharmacy for the refill. If patient does not wish to contact the pharmacy document the reason why and proceed with request.) (Agent: If yes, when and what did the pharmacy advise?)  This is the patient's preferred pharmacy:  [id number] - [place], [place] - [address] [place] [address] Phone: [phone] Fax: [phone]  Is this the correct pharmacy for this prescription? Yes If no, delete pharmacy and type the correct one.   Has the prescription been filled recently? Yes  Is the patient out of the medication? Yes  Has the patient been seen for an appointment in the last year OR does the patient have an upcoming appointment? Yes  Can we respond through MyChart? Yes  Agent: Please be advised that Rx refills may take up to 3 business days. We ask that you follow-up with your pharmacy.

## 2024-11-08 NOTE — Telephone Encounter (Signed)
 Wellbutrin - not on active med list Discontinued at LOV 11/19 due to appetite

## 2024-11-08 NOTE — Telephone Encounter (Signed)
 Attempted to contact patient regarding refill request for Wellbutrin .  Left callback number for office to follow-up.  Medication requested was discontinued on 11/19, thus unable to pend medication.

## 2024-11-08 NOTE — Telephone Encounter (Signed)
 Copied from CRM #[id number]. Topic: Clinical - Medication Refill >> Nov 08, 2024  2:57 PM [name] wrote: Medication: buPROPion  (WELLBUTRIN  XL) 150 MG 24 hr tablet  Has the patient contacted their pharmacy? Yes (Agent: If no, request that the patient contact the pharmacy for the refill. If patient does not wish to contact the pharmacy document the reason why and proceed with request.) (Agent: If yes, when and what did the pharmacy advise?)  This is the patient's preferred pharmacy:  [id number] - [place], [place] - [address] [place] [address] Phone: [phone] Fax: [phone]   Is this the correct pharmacy for this prescription? Yes If no, delete pharmacy and type the correct one.   Has the prescription been filled recently? Yes  Is the patient out of the medication? Yes  Has the patient been seen for an appointment in the last year OR does the patient have an upcoming appointment? Yes  Can we respond through MyChart? Yes  Agent: Please be advised that Rx refills may take up to 3 business days. We ask that you follow-up with your pharmacy.

## 2024-11-13 ENCOUNTER — Ambulatory Visit: Admitting: Family Medicine

## 2024-11-13 ENCOUNTER — Encounter: Payer: Self-pay | Admitting: Family Medicine

## 2024-11-13 VITALS — BP 153/71 | HR 83 | Ht 67.5 in | Wt 137.0 lb

## 2024-11-13 DIAGNOSIS — F03918 Unspecified dementia, unspecified severity, with other behavioral disturbance: Secondary | ICD-10-CM | POA: Diagnosis not present

## 2024-11-13 DIAGNOSIS — R1909 Other intra-abdominal and pelvic swelling, mass and lump: Secondary | ICD-10-CM | POA: Insufficient documentation

## 2024-11-13 DIAGNOSIS — Z006 Encounter for examination for normal comparison and control in clinical research program: Secondary | ICD-10-CM | POA: Diagnosis not present

## 2024-11-13 DIAGNOSIS — G309 Alzheimer's disease, unspecified: Secondary | ICD-10-CM | POA: Diagnosis not present

## 2024-11-13 NOTE — Telephone Encounter (Signed)
 Note in patient chart from visit 11/01/2024 reads Abnormal weight loss Seems related to decreased appetite.  Will have her discontinue bupropion  as this may be contributing.

## 2024-11-13 NOTE — Telephone Encounter (Signed)
 Duplicate message.   I've contacted the patient regarding her med refill request. Per the patient she is taking the Wellbutrin  150 mg and would like a refill for the med. Rx is not listed in active med list

## 2024-11-13 NOTE — Assessment & Plan Note (Signed)
 Firm nodule in L groin area.  No improvement with doxycycline . US  of soft tissue ordered.

## 2024-11-13 NOTE — Telephone Encounter (Signed)
 Spoke with patient and patient's husband . States she has been without wellbutrin  now for about 2 months and this did not affect her appetite at all - per her husband - states she really does need the medication . The issue at the pharmacy is that  the pharmacy states she picked [facility]  and they will not refill again  until January .  She has been at the pharmacy 3 times with same answer each time.  Her husband  states she takes  wellbutrin  XL 150mg  once per day  O.k. to restart medication ? And what can be done to assist her in the refill issue ? O.k. to leave a detailed voice mail message on patient listed home #

## 2024-11-13 NOTE — Progress Notes (Signed)
 Cindy [name] - 73 y.o. female MRN [id number]  Date of birth: 05-12-1951  Subjective Chief Complaint  Patient presents with   Cyst    HPI Cindy [name] is a 73 y.o. female here today for follow up of recent UC visit.  Seen last week with complaint of possible abscess of groin.  She has had this for about 3 weeks.  Rx doxycycline  at [facility].  She reports that she is taking this but hasn't noticed much improvement.  She continues to have some swelling.  She has done warm soaks without much improvement.  She does have some occasional drainage form the area.  Denies fever or chills.  ROS:  A comprehensive ROS was completed and negative except as noted per HPI  No Known Allergies  Past Medical History:  Diagnosis Date   Arthritis    Cancer (HCC)    breast   Depression    GERD (gastroesophageal reflux disease)    Hyperlipidemia    Tendonitis     Past Surgical History:  Procedure Laterality Date   BREAST SURGERY  2000   right   ELBOW SURGERY     right    Social History   Socioeconomic History   Marital status: Married    Spouse name: Todd   Number of children: 0   Years of education: 18   Highest education level: Master's degree (e.g., MA, MS, MEng, MEd, MSW, MBA)  Occupational History   Occupation: Retired.   Occupation: retired  Tobacco Use   Smoking status: Never   Smokeless tobacco: Never  Vaping Use   Vaping status: Never Used  Substance and Sexual Activity   Alcohol use: Not Currently    Comment: rarely   Drug use: No   Sexual activity: Yes    Birth control/protection: Post-menopausal  Other Topics Concern   Not on file  Social History Narrative   Lives with her husband. They help take care of her brother's grandchildren. She enjoys singing in the choir and gardening.   Social Drivers of Corporate Investment Banker Strain: Low Risk  (12/28/2022)   Overall Financial Resource Strain (CARDIA)    Difficulty of Paying Living Expenses: Not hard at all  Food  Insecurity: No Food Insecurity (11/10/2023)   Hunger Vital Sign    Worried About Running Out of Food in the Last Year: Never true    Ran Out of Food in the Last Year: Never true  Transportation Needs: No Transportation Needs (11/10/2023)   PRAPARE - Administrator, Civil Service (Medical): No    Lack of Transportation (Non-Medical): No  Physical Activity: Inactive (12/28/2022)   Exercise Vital Sign    Days of Exercise per Week: 0 days    Minutes of Exercise per Session: 0 min  Stress: No Stress Concern Present (12/28/2022)   [employer] of Occupational Health - Occupational Stress Questionnaire    Feeling of Stress : Not at all  Social Connections: Socially Integrated (12/28/2022)   Social Connection and Isolation Panel    Frequency of Communication with Friends and Family: More than three times a week    Frequency of Social Gatherings with Friends and Family: More than three times a week    Attends Religious Services: More than 4 times per year    Active Member of [employer] or Organizations: Yes    Attends Engineer, Structural: More than 4 times per year    Marital Status: Married    Family History  Problem Relation Age of Onset   Cancer Mother        bone   Hypertension Father    Diabetes Father    Stroke Father    Hypertension Sister    Hypertension Sister    Alzheimer's disease Brother    Colon cancer Neg Hx    Esophageal cancer Neg Hx    Rectal cancer Neg Hx    Stomach cancer Neg Hx     Health Maintenance  Topic Date Due   Medicare Annual Wellness (AWV)  12/01/2024 (Originally 12/29/2023)   Influenza Vaccine  03/13/2025 (Originally 07/14/2024)   COVID-19 Vaccine (7 - 2025-26 season) 11/17/2025 (Originally 08/14/2024)   Mammogram  07/26/2025   DTaP/Tdap/Td (4 - Td or Tdap) 04/05/2028   Colonoscopy  08/28/2029   Pneumococcal Vaccine: 50+ Years  Completed   Bone Density Scan  Completed   Hepatitis C Screening  Completed   Zoster Vaccines- Shingrix   Completed   Meningococcal B Vaccine  Aged Out   Hepatitis B Vaccines 19-59 Average Risk  Discontinued     ----------------------------------------------------------------------------------------------------------------------------------------------------------------------------------------------------------------- Physical Exam BP (!) 143/76 (BP Location: Right Arm, Patient Position: Sitting, Cuff Size: Normal)   Pulse 83   Ht 5' 7.5 (1.715 m)   Wt 137 lb (62.1 kg)   SpO2 98%   BMI 21.14 kg/m   Physical Exam Constitutional:      Appearance: Normal appearance.  Musculoskeletal:     Cervical back: Neck supple.  Skin:    Comments: Non-tender firm nodule in L groin area. No drainage noted.   Neurological:     Mental Status: She is alert.     ------------------------------------------------------------------------------------------------------------------------------------------------------------------------------------------------------------------- Assessment and Plan  Groin mass Firm nodule in L groin area.  No improvement with doxycycline . US  of soft tissue ordered.    No orders of the defined types were placed in this encounter.   No follow-ups on file.

## 2024-11-13 NOTE — Telephone Encounter (Signed)
 I've contacted the patient regarding her med refill request. Per the patient she is taking the Wellbutrin  150 mg and would like a refill for the med. Rx is not listed in active med list.

## 2024-11-14 ENCOUNTER — Other Ambulatory Visit

## 2024-11-14 ENCOUNTER — Other Ambulatory Visit: Payer: Self-pay | Admitting: Family Medicine

## 2024-11-14 DIAGNOSIS — R600 Localized edema: Secondary | ICD-10-CM | POA: Diagnosis not present

## 2024-11-14 DIAGNOSIS — R1909 Other intra-abdominal and pelvic swelling, mass and lump: Secondary | ICD-10-CM

## 2024-11-15 ENCOUNTER — Telehealth: Payer: Self-pay

## 2024-11-15 NOTE — Telephone Encounter (Signed)
 Copied from CRM #[id number]. Topic: Clinical - Lab/Test Results >> Nov 15, 2024 12:37 PM Delon T wrote: Reason for CRM: husband calling for us  results- [phone]

## 2024-11-15 NOTE — Telephone Encounter (Signed)
 Called patient husband Nancyann and informed him that the US  has not yet resulted  from radiology but we would reach to him with the results once they are received and reviewed by provider.

## 2024-11-16 NOTE — Telephone Encounter (Signed)
 Attempted to contact the patient regarding her refill request for bupropion . No answer, left a detailed vm message. Per the provider's note on 11/01/24 - bupropion  was discontinued as it was contributing to the patient's weight loss. Direct call back info provided.

## 2024-11-16 NOTE — Telephone Encounter (Signed)
 Left message for a return call

## 2024-11-17 ENCOUNTER — Emergency Department (HOSPITAL_COMMUNITY)

## 2024-11-17 ENCOUNTER — Telehealth: Payer: Self-pay | Admitting: Neurology

## 2024-11-17 ENCOUNTER — Other Ambulatory Visit: Payer: Self-pay

## 2024-11-17 ENCOUNTER — Emergency Department (HOSPITAL_COMMUNITY)
Admission: EM | Admit: 2024-11-17 | Discharge: 2024-11-17 | Disposition: A | Discharge status: Home / Self Care | Attending: Emergency Medicine | Admitting: Emergency Medicine

## 2024-11-17 DIAGNOSIS — R110 Nausea: Secondary | ICD-10-CM | POA: Insufficient documentation

## 2024-11-17 DIAGNOSIS — G319 Degenerative disease of nervous system, unspecified: Secondary | ICD-10-CM | POA: Diagnosis not present

## 2024-11-17 DIAGNOSIS — T454X5A Adverse effect of iron and its compounds, initial encounter: Secondary | ICD-10-CM | POA: Insufficient documentation

## 2024-11-17 DIAGNOSIS — R112 Nausea with vomiting, unspecified: Secondary | ICD-10-CM | POA: Diagnosis not present

## 2024-11-17 DIAGNOSIS — R531 Weakness: Secondary | ICD-10-CM | POA: Diagnosis not present

## 2024-11-17 DIAGNOSIS — G309 Alzheimer's disease, unspecified: Secondary | ICD-10-CM | POA: Insufficient documentation

## 2024-11-17 DIAGNOSIS — T887XXA Unspecified adverse effect of drug or medicament, initial encounter: Secondary | ICD-10-CM

## 2024-11-17 LAB — URINALYSIS, ROUTINE W REFLEX MICROSCOPIC
Bilirubin Urine: NEGATIVE
Glucose, UA: NEGATIVE mg/dL
Ketones, ur: NEGATIVE mg/dL
Nitrite: NEGATIVE
Protein, ur: NEGATIVE mg/dL
Specific Gravity, Urine: 1.01 (ref 1.005–1.030)
pH: 7 (ref 5.0–8.0)

## 2024-11-17 LAB — URINALYSIS, MICROSCOPIC (REFLEX)
Bacteria, UA: NONE SEEN
WBC, UA: >50 WBC/hpf (ref 0–5)

## 2024-11-17 LAB — CBC WITH DIFFERENTIAL/PLATELET
Abs Immature Granulocytes: 0.01 K/uL (ref 0.00–0.07)
Basophils Absolute: 0 K/uL (ref 0.0–0.1)
Basophils Relative: 0 %
Eosinophils Absolute: 0 K/uL (ref 0.0–0.5)
Eosinophils Relative: 1 %
HCT: 37.9 % (ref 36.0–46.0)
Hemoglobin: 12.4 g/dL (ref 12.0–15.0)
Immature Granulocytes: 0 %
Lymphocytes Relative: 30 %
Lymphs Abs: 1.8 K/uL (ref 0.7–4.0)
MCH: 28.4 pg (ref 26.0–34.0)
MCHC: 32.7 g/dL (ref 30.0–36.0)
MCV: 86.9 fL (ref 80.0–100.0)
Monocytes Absolute: 0.4 K/uL (ref 0.1–1.0)
Monocytes Relative: 7 %
Neutro Abs: 3.8 K/uL (ref 1.7–7.7)
Neutrophils Relative %: 62 %
Platelets: 231 K/uL (ref 150–400)
RBC: 4.36 MIL/uL (ref 3.87–5.11)
RDW: 15.9 % — ABNORMAL HIGH (ref 11.5–15.5)
WBC: 6.1 K/uL (ref 4.0–10.5)
nRBC: 0 % (ref 0.0–0.2)

## 2024-11-17 LAB — COMPREHENSIVE METABOLIC PANEL WITH GFR
ALT: 13 U/L (ref 0–44)
AST: 18 U/L (ref 15–41)
Albumin: 3.6 g/dL (ref 3.5–5.0)
Alkaline Phosphatase: 83 U/L (ref 38–126)
Anion gap: 13 (ref 5–15)
BUN: 8 mg/dL (ref 8–23)
CO2: 23 mmol/L (ref 22–32)
Calcium: 8.9 mg/dL (ref 8.9–10.3)
Chloride: 107 mmol/L (ref 98–111)
Creatinine, Ser: 0.83 mg/dL (ref 0.44–1.00)
GFR, Estimated: >60 mL/min (ref >60)
Glucose, Bld: 91 mg/dL (ref 70–99)
Potassium: 4 mmol/L (ref 3.5–5.1)
Sodium: 143 mmol/L (ref 135–145)
Total Bilirubin: 0.5 mg/dL (ref 0.0–1.2)
Total Protein: 6.6 g/dL (ref 6.5–8.1)

## 2024-11-17 LAB — I-STAT CHEM 8, ED
BUN: 6 mg/dL — ABNORMAL LOW (ref 8–23)
Calcium, Ion: 1.11 mmol/L — ABNORMAL LOW (ref 1.15–1.40)
Chloride: 105 mmol/L (ref 98–111)
Creatinine, Ser: 0.9 mg/dL (ref 0.44–1.00)
Glucose, Bld: 93 mg/dL (ref 70–99)
HCT: 37 % (ref 36.0–46.0)
Hemoglobin: 12.6 g/dL (ref 12.0–15.0)
Potassium: 3.9 mmol/L (ref 3.5–5.1)
Sodium: 144 mmol/L (ref 135–145)
TCO2: 24 mmol/L (ref 22–32)

## 2024-11-17 LAB — LIPASE, BLOOD: Lipase: 52 U/L — ABNORMAL HIGH (ref 11–51)

## 2024-11-17 LAB — MAGNESIUM: Magnesium: 2.1 mg/dL (ref 1.7–2.4)

## 2024-11-17 MED ORDER — ONDANSETRON HCL 4 MG/2ML IJ SOLN: 4.0000 mg | Freq: Once | INTRAMUSCULAR | Status: DC

## 2024-11-17 MED ORDER — LACTATED RINGERS IV BOLUS
1000.0000 mL | Freq: Once | INTRAVENOUS | Status: AC
  Administered 2024-11-17: 1000 mL via INTRAVENOUS

## 2024-11-17 MED ORDER — GADOBUTROL 1 MMOL/ML IV SOLN
6.0000 mL | Freq: Once | INTRAVENOUS | Status: AC | PRN
  Administered 2024-11-17: 6 mL via INTRAVENOUS

## 2024-11-17 MED ORDER — ONDANSETRON 4 MG PO TBDP
4.0000 mg | ORAL_TABLET | Freq: Once | ORAL | Status: AC
  Administered 2024-11-17: 4 mg via ORAL
  Filled 2024-11-17: qty 1

## 2024-11-17 NOTE — ED Notes (Signed)
 Patient transported to MRI

## 2024-11-17 NOTE — Discharge Instructions (Signed)
 You were seen for your nausea and vomiting in the emergency department.  It is likely from your Leqembi  so please stop taking this medicine  At home, please take Zofran  for any nausea.    Check your MyChart online for the results of any tests that had not resulted by the time you left the emergency department.   Follow-up with your primary doctor in 2-3 days regarding your visit.    Return immediately to the emergency department if you experience any of the following: Vomiting despite the medicine, abdominal pain, severe headache, or any other concerning symptoms.    Thank you for visiting our Emergency Department. It was a pleasure taking care of you today.

## 2024-11-17 NOTE — ED Notes (Signed)
 Pt ambulated to restroom without assistance.

## 2024-11-17 NOTE — ED Triage Notes (Addendum)
 Pt bib [facility] ems from home. Increased weakness x3 weeks. Been having infusions of leqembi  for memory loss. Nausea and increased weakness today. Pt reports not having any appetite for nurse 3 days.  128/72 80hr 90% ra 16rr

## 2024-11-17 NOTE — ED Provider Notes (Signed)
 [place] EMERGENCY DEPARTMENT AT [facility] Provider Note   CSN: [id number] Arrival date & time: 11/17/24  9087     Patient presents with: Weakness   Cindy [name] is a 72 y.o. female.   73 year old female history of cerebral amyloidopathy/Alzheimer's on Leqembi  who presents to the emergency department with nausea and weight loss.  Patient recently was started on Leqembi  for her Alzheimer's disease.  Since then has been having nausea decreased appetite and weakness.  No vomiting.  No loose stools.  Her husband called her neurologist today who referred her into the emergency department for an MRI, blood work, and IV fluids.  Patient denies any headache to me.  No abdominal pain.  No other infectious symptoms       Prior to Admission medications   Medication Sig Start Date End Date Taking? Authorizing Provider  Calcium Carbonate-Vit D-Min (CALCIUM 1200 PO) Take 1 tablet by mouth daily.    [provider]  clonazePAM  (KLONOPIN ) 1 MG tablet TAKE 1 TABLET BY MOUTH 2 TIMES DAILY AS NEEDED. 08/24/24   Alvia Bring, DO  donepezil  (ARICEPT ) 10 MG tablet Take 1 tablet (10 mg total) by mouth daily. 08/18/24   Dohmeier, Dedra, MD  doxycycline  (VIBRAMYCIN ) 100 MG capsule Take 1 capsule (100 mg total) by mouth 2 (two) times daily for 10 days. 11/07/24 11/17/24  Ragan, Michael, FNP  famotidine  (PEPCID ) 20 MG tablet TAKE 1 TABLET BY MOUTH TWICE A DAY 10/09/24   Alvia Bring, DO  fluconazole  (DIFLUCAN ) 150 MG tablet Take 1 tab p.o. for vaginal candidiasis, may repeat 1 tab p.o. in 3 days if symptoms are not resolved. 11/07/24   Ragan, Michael, FNP  Lecanemab -irmb (LEQEMBI ) 200 MG/2ML SOLN Infusion at [facility] 08/18/24   Dohmeier, Dedra, MD  lovastatin  (MEVACOR ) 20 MG tablet Take 1 tablet (20 mg total) by mouth at bedtime. 08/31/24   Willo Mini, NP  meloxicam  (MOBIC ) 15 MG tablet Take 1 tablet (15 mg total) by mouth daily. 08/31/24   Willo Mini, NP  memantine  (NAMENDA ) 10 MG tablet  TAKE 1 TABLET BY MOUTH TWICE A DAY 07/20/24   Dohmeier, Dedra, MD  mirtazapine  (REMERON ) 7.5 MG tablet Take 1 tablet (7.5 mg total) by mouth at bedtime as needed. Helps with sleep prn 08/18/24   Dohmeier, Dedra, MD  Multiple Vitamin (MULTIVITAMIN) tablet Take 1 tablet by mouth daily.    [provider]  ondansetron  (ZOFRAN -ODT) 4 MG disintegrating tablet Take 1 tablet (4 mg total) by mouth every 8 (eight) hours as needed for nausea or vomiting. 08/31/24   Willo Mini, NP  PARoxetine  (PAXIL ) 20 MG tablet Take 40 mg by mouth daily. 09/14/24   [provider]  Rimegepant Sulfate (NURTEC) 75 MG TBDP Take 1 tablet (75 mg total) by mouth daily as needed (migraine). 12/27/23   Alvia Bring, DO  SUMAtriptan  (IMITREX ) 25 MG tablet Take 1 tablet (25 mg total) by mouth every 2 (two) hours as needed for migraine. May repeat in 2 hours if headache persists or recurs. 12/27/23   Alvia Bring, DO  trimethoprim  (TRIMPEX ) 100 MG tablet TAKE 1 TABLET EVERY DAY 07/05/23   Alvia Bring, DO    Allergies: Patient has no known allergies.    Review of Systems  Updated Vital Signs BP (!) 115/53   Pulse 70   Temp 97.9 F (36.6 C)   Resp 15   Ht 5' 7 (1.702 m)   Wt 61.2 kg   SpO2 100%   BMI 21.14 kg/m  Physical Exam Vitals and nursing note reviewed.  Constitutional:      General: She is not in acute distress.    Appearance: She is well-developed.     Comments: Alert and oriented x 3  HENT:     Head: Normocephalic and atraumatic.     Right Ear: External ear normal.     Left Ear: External ear normal.     Nose: Nose normal.  Eyes:     Extraocular Movements: Extraocular movements intact.     Conjunctiva/sclera: Conjunctivae normal.     Pupils: Pupils are equal, round, and reactive to light.  Cardiovascular:     Rate and Rhythm: Normal rate and regular rhythm.     Heart sounds: No murmur heard. Pulmonary:     Effort: Pulmonary effort is normal. No respiratory distress.     Breath  sounds: Normal breath sounds.  Abdominal:     General: Abdomen is flat. There is no distension.     Palpations: Abdomen is soft. There is no mass.     Tenderness: There is no abdominal tenderness. There is no guarding.  Musculoskeletal:     Cervical back: Normal range of motion and neck supple.     Right lower leg: No edema.     Left lower leg: No edema.  Skin:    General: Skin is warm and dry.  Neurological:     Mental Status: She is alert and oriented to person, place, and time. Mental status is at baseline.     Cranial Nerves: No cranial nerve deficit.     Sensory: No sensory deficit.     Motor: No weakness.  Psychiatric:        Mood and Affect: Mood normal.     (all labs ordered are listed, but only abnormal results are displayed) Labs Reviewed  LIPASE, BLOOD - Abnormal; Notable for the following components:      Result Value   Lipase 52 (*)    All other components within normal limits  CBC WITH DIFFERENTIAL/PLATELET - Abnormal; Notable for the following components:   RDW 15.9 (*)    All other components within normal limits  URINALYSIS, ROUTINE W REFLEX MICROSCOPIC - Abnormal; Notable for the following components:   Hgb urine dipstick TRACE (*)    Leukocytes,Ua MODERATE (*)    All other components within normal limits  URINALYSIS, MICROSCOPIC (REFLEX) - Abnormal; Notable for the following components:   Non Squamous Epithelial PRESENT (*)    All other components within normal limits  I-STAT CHEM 8, ED - Abnormal; Notable for the following components:   BUN 6 (*)    Calcium, Ion 1.11 (*)    All other components within normal limits  COMPREHENSIVE METABOLIC PANEL WITH GFR  MAGNESIUM    EKG: None  Radiology: MR Brain W and Wo Contrast Result Date: 11/17/2024 EXAM: MRI BRAIN WITH AND WITHOUT CONTRAST 11/17/2024 01:50:37 PM TECHNIQUE: Multiplanar multisequence MRI of the head/brain was performed with and without the administration of 6 mL gadobutrol  (GADAVIST ) 1  MMOL/ML injection. COMPARISON: Head MRI 10/09/2024. CLINICAL HISTORY: Nausea and vomiting on Lequembi, rule out microbleeds/edema. FINDINGS: BRAIN AND VENTRICLES: There is no evidence of an acute infarct, acute or chronic intracranial hemorrhage, mass, midline shift, hydrocephalus, or extra-axial fluid collection. Small foci of T2 hyperintensity in the cerebral white matter bilaterally are unchanged and nonspecific but compatible with mild chronic small vessel ischemic disease. No brain edema or abnormal enhancement is present. There is mild cerebral atrophy. Major intracranial vascular  flow voids are preserved. ORBITS: No acute abnormality. SINUSES: Mild mucosal thickening in the paranasal sinuses. Trace right mastoid fluid. BONES AND SOFT TISSUES: Normal bone marrow signal and enhancement. No acute soft tissue abnormality. IMPRESSION: 1. Unchanged appearance of the brain. No evidence of ARIA or other acute intracranial abnormality. Electronically signed by: Dasie Hamburg MD 11/17/2024 02:16 PM EST RP Workstation: [id number]   DG Chest Portable 1 View Result Date: 11/17/2024 EXAM: 1 VIEW(S) XRAY OF THE CHEST 11/17/2024 09:55:00 AM COMPARISON: Comparison 02/26/2011. CLINICAL HISTORY: weakness FINDINGS: LUNGS AND PLEURA: No focal pulmonary opacity. No pleural effusion. No pneumothorax. HEART AND MEDIASTINUM: No acute abnormality of the cardiac and mediastinal silhouettes. BONES AND SOFT TISSUES: No acute osseous abnormality. IMPRESSION: 1. No acute cardiopulmonary process. Electronically signed by: Lynwood Seip MD 11/17/2024 10:23 AM EST RP Workstation: [id number]     Procedures   Medications Ordered in the ED  lactated ringers  bolus 1,000 mL (0 mLs Intravenous Stopped 11/17/24 1103)  gadobutrol  (GADAVIST ) 1 MMOL/ML injection 6 mL (6 mLs Intravenous Contrast Given 11/17/24 1345)  ondansetron  (ZOFRAN -ODT) disintegrating tablet 4 mg (4 mg Oral Given 11/17/24 1511)    Clinical Course as of 11/17/24 1959  Fri  Nov 17, 2024  0935 Discussed with Dr Chalice (patient's outpatient neurologist).  Recommends MRI and if normal and patient is tolerating p.o. could potentially be discharged with Zofran .  Recommends against different antiemetics because of dopaminergic effects [RP]    Clinical Course User Index [RP] Yolande Lamar BROCKS, MD                                 Medical Decision Making Amount and/or Complexity of Data Reviewed Labs: ordered. Radiology: ordered.  Risk Prescription drug management.   TIANNI ESCAMILLA is a 73 year old female history of cerebral amyloidopathy/Alzheimer's on Leqembi  who presents to the emergency department with nausea and weight loss.   Initial Ddx:  Leqembi  side effect, ICH, AKI, gastroenteritis  MDM/Course:  Patient presents emergency department with nausea and vomiting shortly after starting Leqembi  infusions.  On arrival she is alert and oriented.  Has a nonfocal neurologic exam.  No abdominal tenderness palpation.  It is point in time she denies any sort of nausea.  Did talk to her outpatient neurologist apparently this medication can predispose her to microhemorrhages in the brain.  They are requesting that she have an MRI with and without contrast which was performed that does not show any acute findings.  Blood work was unremarkable.  No AKI.  Had a urinalysis that appears to be contaminated but she is not currently having any urinary symptoms so we will hold off on treating at this point in time.  Upon re-evaluation she is tolerating p.o.  Discharged home with prescription of Zofran  and instructions to follow-up with her neurologist.  Suspect this is from side effects of Leqembi  and so likely will need to be discontinued  This patient presents to the ED for concern of complaints listed in HPI, this involves an extensive number of treatment options, and is a complaint that carries with it a high risk of complications and morbidity. Disposition including potential  need for admission considered.   Dispo: DC Home. Return precautions discussed including, but not limited to, those listed in the AVS. Allowed pt time to ask questions which were answered fully prior to dc.  Additional history obtained from spouse Records reviewed Outpatient Clinic Notes The following labs were independently  interpreted: Chemistry and show no acute abnormality I independently reviewed the following imaging with scope of interpretation limited to determining acute life threatening conditions related to emergency care: Chest x-ray and agree with the radiologist interpretation with the following exceptions: none I personally reviewed and interpreted cardiac monitoring: normal sinus rhythm  I personally reviewed and interpreted the pt's EKG: see above for interpretation  I have reviewed the patients home medications and made adjustments as needed Consults: Neurology Social Determinants of health:  Geriatric  Portions of this note were generated with Scientist, clinical (histocompatibility and immunogenetics). Dictation errors may occur despite best attempts at proofreading.     Final diagnoses:  Nausea  Non-dose-related adverse reaction to medication, initial encounter    ED Discharge Orders     None          Yolande Lamar BROCKS, MD 11/17/24 [phone]

## 2024-11-17 NOTE — Telephone Encounter (Signed)
 I received a phone call this morning at 7.58 AM from [facility] , stating she was very very sick, couldn't hold down any food, and weak - she has  been nauseated for 3 days and even fluid was hard to keep down.  The patient has had her second anti amyloid infusion LEQUEMBI this week and this illness is likely related to the Alzheimer's disease treatment by monoclonal antibody.    Lecanemab -irmb (LEQEMBI ) 200 MG/2ML SOLN    Pre infusion MRIs were normal, no microbleeds (ARIA) were noted .  The patient planned initially to go by private car, but changed her plans to call the EMS , her husband Nancyann will accompany her.   [place] is their destination.   She will need likely fluids Iv, zofran  and steroids and an MRI brain to rule out brain edema and intracerebral microbleeds ( ARIA ) .  Radiologist has to be informed  that she is on LEQUEMBI .SABRADedra Gores, MD   FYI : PCP Velma Ku, MD

## 2024-11-20 ENCOUNTER — Ambulatory Visit: Admitting: Family Medicine

## 2024-11-20 ENCOUNTER — Encounter: Payer: Self-pay | Admitting: Family Medicine

## 2024-11-20 VITALS — BP 134/77 | HR 88 | Ht 67.0 in | Wt 137.0 lb

## 2024-11-20 DIAGNOSIS — M542 Cervicalgia: Secondary | ICD-10-CM | POA: Diagnosis not present

## 2024-11-20 DIAGNOSIS — F330 Major depressive disorder, recurrent, mild: Secondary | ICD-10-CM | POA: Diagnosis not present

## 2024-11-20 DIAGNOSIS — R1909 Other intra-abdominal and pelvic swelling, mass and lump: Secondary | ICD-10-CM | POA: Diagnosis not present

## 2024-11-20 DIAGNOSIS — R634 Abnormal weight loss: Secondary | ICD-10-CM

## 2024-11-20 DIAGNOSIS — M25531 Pain in right wrist: Secondary | ICD-10-CM | POA: Diagnosis not present

## 2024-11-20 MED ORDER — ONDANSETRON 8 MG PO TBDP: 8.0000 mg | ORAL_TABLET | Freq: Three times a day (TID) | ORAL | 3 refills | Status: AC | PRN

## 2024-11-20 NOTE — Assessment & Plan Note (Signed)
 Referral placed to sports medicine for wrist and neck pain.

## 2024-11-20 NOTE — Assessment & Plan Note (Signed)
 We have previously discussed discontinuing bupropion  due to this possibly contributing to appetite suppression.  She never started Paxil  or mirtazapine  per her husband.  They will check to see if they have these at home.

## 2024-11-20 NOTE — Assessment & Plan Note (Signed)
 Still with decreased appetite and some nausea.  Nausea better than it has been.  Likely side effect of Leqembi .  She will discuss discontinuing this with her neurologist.  Adding Zofran  as needed.

## 2024-11-20 NOTE — Progress Notes (Signed)
 Cindy [name] - 73 y.o. female MRN [id number]  Date of birth: 1951-04-27  Subjective Chief Complaint  Patient presents with   Hospitalization Follow-up    HPI Cindy [name] is a 73 y.o. female here today for follow up of recent ED visit.   Seen in ED recently due to having increased nausea and weight loss.  She was recently started on Leqembi  for Alzheimer's.   Concerns for side effects from Leqembi .   Normal CXR and MRI brain.  No evidence of ARIA or microvascular bleeds.  Labs were relatively unremarkable.  Today she reports that she is feeling better.  Still with little appetite.  Nausea has improved some..  She had been instructed to d/c bupropion  previously and continue on mirtazapine  and paxil  due to continued weight loss and appetite suppression.  Per her husband he doesn't think she is taking paxil  or mirtazapine  and has continued on bupropion .  It also sounds like Leqembi  may need to be discontinued due to potential side effects.  She is also having some right wrist pain as well as neck and shoulder pain.  Denies numbness, tingling or weakness.  ROS:  A comprehensive ROS was completed and negative except as noted per HPI  No Known Allergies  Past Medical History:  Diagnosis Date   Arthritis    Cancer (HCC)    breast   Depression    GERD (gastroesophageal reflux disease)    Hyperlipidemia    Tendonitis     Past Surgical History:  Procedure Laterality Date   BREAST SURGERY  2000   right   ELBOW SURGERY     right    Social History   Socioeconomic History   Marital status: Married    Spouse name: Todd   Number of children: 0   Years of education: 18   Highest education level: Master's degree (e.g., MA, MS, MEng, MEd, MSW, MBA)  Occupational History   Occupation: Retired.   Occupation: retired  Tobacco Use   Smoking status: Never   Smokeless tobacco: Never  Vaping Use   Vaping status: Never Used  Substance and Sexual Activity   Alcohol use: Not Currently     Comment: rarely   Drug use: No   Sexual activity: Yes    Birth control/protection: Post-menopausal  Other Topics Concern   Not on file  Social History Narrative   Lives with her husband. They help take care of her brother's grandchildren. She enjoys singing in the choir and gardening.   Social Drivers of Corporate Investment Banker Strain: Low Risk  (12/28/2022)   Overall Financial Resource Strain (CARDIA)    Difficulty of Paying Living Expenses: Not hard at all  Food Insecurity: No Food Insecurity (11/10/2023)   Hunger Vital Sign    Worried About Running Out of Food in the Last Year: Never true    Ran Out of Food in the Last Year: Never true  Transportation Needs: No Transportation Needs (11/10/2023)   PRAPARE - Administrator, Civil Service (Medical): No    Lack of Transportation (Non-Medical): No  Physical Activity: Inactive (12/28/2022)   Exercise Vital Sign    Days of Exercise per Week: 0 days    Minutes of Exercise per Session: 0 min  Stress: No Stress Concern Present (12/28/2022)   [employer] of Occupational Health - Occupational Stress Questionnaire    Feeling of Stress : Not at all  Social Connections: Socially Integrated (12/28/2022)   Social Connection and Isolation  Panel    Frequency of Communication with Friends and Family: More than three times a week    Frequency of Social Gatherings with Friends and Family: More than three times a week    Attends Religious Services: More than 4 times per year    Active Member of Clubs or Organizations: Yes    Attends Engineer, Structural: More than 4 times per year    Marital Status: Married    Family History  Problem Relation Age of Onset   Cancer Mother        bone   Hypertension Father    Diabetes Father    Stroke Father    Hypertension Sister    Hypertension Sister    Alzheimer's disease Brother    Colon cancer Neg Hx    Esophageal cancer Neg Hx    Rectal cancer Neg Hx    Stomach  cancer Neg Hx     Health Maintenance  Topic Date Due   Medicare Annual Wellness (AWV)  12/01/2024 (Originally 12/29/2023)   Influenza Vaccine  03/13/2025 (Originally 07/14/2024)   COVID-19 Vaccine (7 - 2025-26 season) 11/17/2025 (Originally 08/14/2024)   Mammogram  07/26/2025   DTaP/Tdap/Td (4 - Td or Tdap) 04/05/2028   Colonoscopy  08/28/2029   Pneumococcal Vaccine: 50+ Years  Completed   Bone Density Scan  Completed   Hepatitis C Screening  Completed   Zoster Vaccines- Shingrix  Completed   Meningococcal B Vaccine  Aged Out   Hepatitis B Vaccines 19-59 Average Risk  Discontinued     ----------------------------------------------------------------------------------------------------------------------------------------------------------------------------------------------------------------- Physical Exam BP 134/77 (BP Location: Left Arm, Patient Position: Sitting, Cuff Size: Small)   Pulse 88   Ht 5' 7 (1.702 m)   Wt 137 lb (62.1 kg)   SpO2 100%   BMI 21.46 kg/m   Physical Exam Constitutional:      Appearance: Normal appearance.  Eyes:     General: No scleral icterus. Cardiovascular:     Rate and Rhythm: Normal rate and regular rhythm.  Pulmonary:     Effort: Pulmonary effort is normal.     Breath sounds: Normal breath sounds.  Musculoskeletal:     Cervical back: Neck supple.  Neurological:     Mental Status: She is alert.  Psychiatric:        Mood and Affect: Mood normal.        Behavior: Behavior normal.     ------------------------------------------------------------------------------------------------------------------------------------------------------------------------------------------------------------------- Assessment and Plan  Abnormal weight loss Still with decreased appetite and some nausea.  Nausea better than it has been.  Likely side effect of Leqembi .  She will discuss discontinuing this with her neurologist.  Adding Zofran  as needed.  Groin  mass Still awaiting ultrasound read from recent visit.  MDD (major depressive disorder), recurrent episode We have previously discussed discontinuing bupropion  due to this possibly contributing to appetite suppression.  She never started Paxil  or mirtazapine  per her husband.  They will check to see if they have these at home.  Right wrist pain Referral placed to sports medicine for wrist and neck pain.   Meds ordered this encounter  Medications   ondansetron  (ZOFRAN -ODT) 8 MG disintegrating tablet    Sig: Take 1 tablet (8 mg total) by mouth every 8 (eight) hours as needed for nausea.    Dispense:  20 tablet    Refill:  3    No follow-ups on file.

## 2024-11-20 NOTE — Assessment & Plan Note (Signed)
 Still awaiting ultrasound read from recent visit.

## 2024-11-20 NOTE — Patient Instructions (Signed)
-  Stop bupropion    -Take paroxetine  daily.    -Take mirtazapine  each night.    -Try to increase protein and calorie intake.  Shakes such as Information Systems Manager can help supplement this.

## 2024-11-21 ENCOUNTER — Ambulatory Visit: Payer: Self-pay | Admitting: Family Medicine

## 2024-11-21 DIAGNOSIS — R1900 Intra-abdominal and pelvic swelling, mass and lump, unspecified site: Secondary | ICD-10-CM

## 2024-11-23 ENCOUNTER — Ambulatory Visit: Admitting: Neurology

## 2024-11-23 ENCOUNTER — Other Ambulatory Visit: Payer: Self-pay | Admitting: Family Medicine

## 2024-11-23 DIAGNOSIS — R1900 Intra-abdominal and pelvic swelling, mass and lump, unspecified site: Secondary | ICD-10-CM

## 2024-11-27 ENCOUNTER — Ambulatory Visit (HOSPITAL_COMMUNITY): Admission: RE | Admit: 2024-11-27 | Discharge: 2024-11-27 | Attending: Family Medicine | Admitting: Family Medicine

## 2024-11-27 DIAGNOSIS — R1900 Intra-abdominal and pelvic swelling, mass and lump, unspecified site: Secondary | ICD-10-CM

## 2024-11-27 MED ORDER — GADOBUTROL 1 MMOL/ML IV SOLN
6.0000 mL | Freq: Once | INTRAVENOUS | Status: AC | PRN
  Administered 2024-11-27: 13:00:00 6 mL via INTRAVENOUS

## 2024-11-29 ENCOUNTER — Ambulatory Visit: Admitting: Family Medicine

## 2024-12-07 ENCOUNTER — Ambulatory Visit: Payer: Self-pay | Admitting: Family Medicine

## 2024-12-07 MED ORDER — CEPHALEXIN 500 MG PO CAPS: 500.0000 mg | ORAL_CAPSULE | Freq: Four times a day (QID) | ORAL | 0 refills | Status: AC

## 2024-12-09 ENCOUNTER — Other Ambulatory Visit: Payer: Self-pay | Admitting: Neurology

## 2024-12-09 DIAGNOSIS — Z820 Family history of epilepsy and other diseases of the nervous system: Secondary | ICD-10-CM

## 2024-12-09 DIAGNOSIS — F0390 Unspecified dementia without behavioral disturbance: Secondary | ICD-10-CM

## 2024-12-11 ENCOUNTER — Other Ambulatory Visit: Payer: Self-pay | Admitting: Family Medicine

## 2024-12-11 MED ORDER — CLONAZEPAM 1 MG PO TABS: ORAL_TABLET | ORAL | 1 refills | Status: AC

## 2024-12-14 ENCOUNTER — Other Ambulatory Visit: Payer: Self-pay | Admitting: Family Medicine

## 2024-12-14 DIAGNOSIS — F330 Major depressive disorder, recurrent, mild: Secondary | ICD-10-CM

## 2024-12-18 ENCOUNTER — Telehealth: Payer: Self-pay | Admitting: Neurology

## 2024-12-18 NOTE — Telephone Encounter (Signed)
 I received a call from Cindy [name]'s husband,. Todd Lagos, who was stating her appetite had  further decreased and she was losing more weight.  No nausea. Just not feeling like eating.  No stomach issues.    We had a discussion after her last infusion appointment here for Lequembi and I learnt that she felt not bad at all, just has no Apetite. I am not sure that this is related to aricept  , namenda  or infusion drug. We decided to continue all 3 .   I like for her to just take extra calories in form of boost or carnation as she really doesn't benefit from weight loss in any way.   Encouraged activity in company , social outings -and meals may taste better when in company?

## 2024-12-20 ENCOUNTER — Ambulatory Visit: Admitting: Family Medicine

## 2024-12-20 ENCOUNTER — Encounter: Payer: Self-pay | Admitting: Family Medicine

## 2024-12-20 VITALS — BP 132/78 | HR 86 | Ht 67.0 in | Wt 133.0 lb

## 2024-12-20 DIAGNOSIS — M19049 Primary osteoarthritis, unspecified hand: Secondary | ICD-10-CM | POA: Diagnosis not present

## 2024-12-20 NOTE — Patient Instructions (Addendum)
 Try voltaren gel on thumb.    -Stop bupropion    -Take paroxetine  daily.    -Take mirtazapine  each night.    -Try to increase protein and calorie intake.  Shakes such as Information Systems Manager can help supplement this.

## 2024-12-20 NOTE — Progress Notes (Signed)
 " Cindy [name] - 74 y.o. female MRN [id number]  Date of birth: [birth year]  Subjective Chief Complaint  Patient presents with   Joint Swelling    HPI Cindy [name] is a 74 y.o. female here today for follow up of R thumb pain.  She has had imaging and did therapy last year.  Pain is improved but still with some swelling and tenderness.  She has not tried any topicals on this area. She is using a brace.   ROS:  A comprehensive ROS was completed and negative except as noted per HPI  Allergies[1]  Past Medical History:  Diagnosis Date   Arthritis    Cancer (HCC)    breast   Depression    GERD (gastroesophageal reflux disease)    Hyperlipidemia    Tendonitis     Past Surgical History:  Procedure Laterality Date   BREAST SURGERY  2000   right   ELBOW SURGERY     right    Social History   Socioeconomic History   Marital status: Married    Spouse name: Todd   Number of children: 0   Years of education: 18   Highest education level: Master's degree (e.g., MA, MS, MEng, MEd, MSW, MBA)  Occupational History   Occupation: Retired.   Occupation: retired  Tobacco Use   Smoking status: Never   Smokeless tobacco: Never  Vaping Use   Vaping status: Never Used  Substance and Sexual Activity   Alcohol use: Not Currently    Comment: rarely   Drug use: No   Sexual activity: Yes    Birth control/protection: Post-menopausal  Other Topics Concern   Not on file  Social History Narrative   Lives with her husband. They help take care of her brother's grandchildren. She enjoys singing in the choir and gardening.   Social Drivers of Health   Tobacco Use: Low Risk (11/20/2024)   Patient History    Smoking Tobacco Use: Never    Smokeless Tobacco Use: Never    Passive Exposure: Not on file  Financial Resource Strain: Low Risk (12/28/2022)   Overall Financial Resource Strain (CARDIA)    Difficulty of Paying Living Expenses: Not hard at all  Food Insecurity: No Food Insecurity  (11/10/2023)   Hunger Vital Sign    Worried About Running Out of Food in the Last Year: Never true    Ran Out of Food in the Last Year: Never true  Transportation Needs: No Transportation Needs (11/10/2023)   PRAPARE - Administrator, Civil Service (Medical): No    Lack of Transportation (Non-Medical): No  Physical Activity: Inactive (12/28/2022)   Exercise Vital Sign    Days of Exercise per Week: 0 days    Minutes of Exercise per Session: 0 min  Stress: No Stress Concern Present (12/28/2022)   [employer] of Occupational Health - Occupational Stress Questionnaire    Feeling of Stress : Not at all  Social Connections: Socially Integrated (12/28/2022)   Social Connection and Isolation Panel    Frequency of Communication with Friends and Family: More than three times a week    Frequency of Social Gatherings with Friends and Family: More than three times a week    Attends Religious Services: More than 4 times per year    Active Member of Clubs or Organizations: Yes    Attends Banker Meetings: More than 4 times per year    Marital Status: Married  Depression (PHQ2-9): Medium Risk (08/31/2024)  Depression (PHQ2-9)    PHQ-2 Score: 7  Alcohol Screen: Low Risk (12/28/2022)   Alcohol Screen    Last Alcohol Screening Score (AUDIT): 1  Housing: Low Risk (11/10/2023)   Housing    Last Housing Risk Score: 0  Utilities: Not At Risk (11/10/2023)   AHC Utilities    Threatened with loss of utilities: No  Health Literacy: Not on file    Family History  Problem Relation Age of Onset   Cancer Mother        bone   Hypertension Father    Diabetes Father    Stroke Father    Hypertension Sister    Hypertension Sister    Alzheimer's disease Brother    Colon cancer Neg Hx    Esophageal cancer Neg Hx    Rectal cancer Neg Hx    Stomach cancer Neg Hx     Health Maintenance  Topic Date Due   Medicare Annual Wellness (AWV)  12/29/2023   Influenza Vaccine   03/13/2025 (Originally 07/14/2024)   COVID-19 Vaccine (7 - 2025-26 season) 11/17/2025 (Originally 08/14/2024)   Mammogram  07/26/2025   DTaP/Tdap/Td (4 - Td or Tdap) 04/05/2028   Colonoscopy  08/28/2029   Pneumococcal Vaccine: 50+ Years  Completed   Bone Density Scan  Completed   Hepatitis C Screening  Completed   Zoster Vaccines- Shingrix  Completed   Meningococcal B Vaccine  Aged Out   Hepatitis B Vaccines 19-59 Average Risk  Discontinued     ----------------------------------------------------------------------------------------------------------------------------------------------------------------------------------------------------------------- Physical Exam BP 132/78   Pulse 86   Ht 5' 7 (1.702 m)   Wt 133 lb (60.3 kg)   SpO2 98%   BMI 20.83 kg/m   Physical Exam Constitutional:      Appearance: Normal appearance.  Musculoskeletal:     Comments: Ttp and swelling of CMC joint of thumb of R hand.   Neurological:     Mental Status: She is alert.     ------------------------------------------------------------------------------------------------------------------------------------------------------------------------------------------------------------------- Assessment and Plan  CMC arthritis Continue brace.  Recommend adding topical voltaren gel. Referral placed to sports medicine.    No orders of the defined types were placed in this encounter.   No follow-ups on file.        [1] No Known Allergies  "

## 2024-12-20 NOTE — Assessment & Plan Note (Signed)
 Continue brace.  Recommend adding topical voltaren gel. Referral placed to sports medicine.

## 2024-12-21 ENCOUNTER — Ambulatory Visit

## 2024-12-21 VITALS — BP 120/74 | Ht 67.0 in | Wt 133.0 lb

## 2024-12-21 DIAGNOSIS — M1811 Unilateral primary osteoarthritis of first carpometacarpal joint, right hand: Secondary | ICD-10-CM | POA: Diagnosis not present

## 2024-12-21 NOTE — Progress Notes (Signed)
 "  Subjective:    Patient ID: Cindy [name], female    DOB: 74 y.o., [birth year]   MRN: [id number]  Chief Complaint: thumb pain  Discussed the use of AI scribe software for clinical note transcription with the patient, who gave verbal consent to proceed.  History of Present Illness Cindy [name] is a 74 year old right hand dominant female who presents with chronic right thumb pain and swelling.  Right thumb pain and swelling - Chronic pain and swelling at the base of the right thumb for approximately one year - Pain is intermittent and worsens with activities such as gardening, housework, and other manual tasks - Pain improves with reduction of activity - Initial significant flare at the base of the thumb - Swelling has improved with topical ointments, but pain persists - Thumb spica splint-style glove provides relief and limits painful motion - No numbness, tingling, or radiation into the hand or fingers - No regular use of oral anti-inflammatory medications  Functional impact and hand use history - Right hand dominant - Extensive lifetime hand use through career, gardening, and farm work  Associated medical history and medications - No history of rheumatoid arthritis or autoimmune disease - Currently taking Lazanda every two weeks - Scheduled to receive Leqembi , with no reported effects on thumb symptoms   Review of Pertinent Imaging: Three-view plain from radiographs obtained of the right hand on 08/31/2024 per my independent review revealing osteophytic change noted at the DIP joints most prominent in digits 1 through 4.  Tiny calcification noted on the dorsal aspect of the distal end of the first metacarpal.  Modest joint space narrowing at the [facility] joint.  Mild to moderate degenerative changes noted at the PIP joints of digits 2 through 5.    Objective:   Vitals:   12/21/24 1000  BP: 120/74    Right Hand/Fingers/Wrist ( compared to normal ) -Inspection: no swelling,  erythema, ecchymoses. No bony deformity or atrophy of the hypothenar region -Palpation: TTP - DIP, - PIP, + MCP of first digit,  - metacarpals, - scaphoid/snuff box, - scaphoid tubercle, - CMC of first digit -AROM/PROM: Full flexion, extension, supination, pronation of the wrist. Full flexion and extension of the fingers, with and without isolation of the DIP. 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 There is no evidence of autoimmune disease or rheumatoid arthritis. She remains highly active and prioritizes functional preservation. Topical and mechanical interventions are preferred for safety and minimal systemic absorption. Hand therapy is indicated to optimize strength and pain control. Steroid injection and surgical fusion were discussed as escalation options, with fusion reserved for severe, refractory cases due to loss of joint motion. Recommend increased use of a thumb spica brace during activities that exacerbate symptoms. Advise application of topical diclofenac (Voltaren) gel to the affected joint twice daily. Order referral to hand therapy for instruction on exercises and pain management techniques. Schedule follow-up in six weeks to reassess symptoms and response to interventions.   "

## 2024-12-25 ENCOUNTER — Ambulatory Visit: Admitting: Neurology

## 2024-12-25 ENCOUNTER — Encounter: Payer: Self-pay | Admitting: Neurology

## 2024-12-25 VITALS — BP 113/65 | HR 80 | Ht 67.0 in | Wt 132.4 lb

## 2024-12-25 DIAGNOSIS — G309 Alzheimer's disease, unspecified: Secondary | ICD-10-CM

## 2024-12-25 DIAGNOSIS — F0280 Dementia in other diseases classified elsewhere without behavioral disturbance: Secondary | ICD-10-CM

## 2024-12-25 DIAGNOSIS — G319 Degenerative disease of nervous system, unspecified: Secondary | ICD-10-CM

## 2024-12-25 DIAGNOSIS — F331 Major depressive disorder, recurrent, moderate: Secondary | ICD-10-CM | POA: Diagnosis not present

## 2024-12-25 DIAGNOSIS — Z820 Family history of epilepsy and other diseases of the nervous system: Secondary | ICD-10-CM | POA: Diagnosis not present

## 2024-12-25 NOTE — Progress Notes (Signed)
 "        Provider:  Dedra Gores, MD  Primary Care Physician:  Alvia Bring, DO [address] [facility] [address] [place] [address]     Referring Provider: Alvia Bring, Do [address] [place],  [place] [address]          Chief Complaint according to patient   Patient presents with:                HISTORY OF PRESENT ILLNESS:  Cindy [name] is a 74 y.o. female patient who is here for revisit 12/25/2024 for  alzheimer's follow up. Today with excellent  fluent speech, conversation with her included long term reports, mid term memories. She had trouble remebering when last singing with the choir. She is willing to increase her fluid intake and caloric intake.  She could name several nurses that attended to her in the infusion center.     Cindy [name] is a 74 y.o. female patient who is here for revisit  for Alzheimer's disease with mild - moderate manifestations:     Followed up on:  1) Dementia panel blood test and ATN,  positive for p-tau and Amyloid index shift.  We have already discussed these results by phone.    2) genetics for AD late onset risk ( Apolipo E 4 one allel) and CMET.  No other treatable cause of dementia was present . elevated risk for genetic late onset Alzheimer's disease by 20-30 % with one allel , higher in women. An individual with only one allel has a higher risk for ARIA>    MRI brain:  no bleeds, no CAA -Cerebral Amyloid Angiopathy *.   PET scan confirmed amyloid deposits.       *CAA is a major risk factor for developing ARIA.  Not having CAA  does not exclude risk for developing ARiA-  but CAA patients would not be considered for therapy.      ARIA (Amyloid-Related Imaging Abnormalities) are a class of potential side effects in patients receiving anti-amyloid therapies for Alzheimer's Disease, classified as ARIA-E (edema and/or effusion) or ARIA-H (hemorrhage).  These abnormalities are detected via MRI and,  while often asymptomatic, can sometimes cause symptoms like headache or confusion.      Review of Systems: Out of a complete 14 system review, the patient complains of only the following symptoms, and all other reviewed systems are negative.:        Social History   Socioeconomic History   Marital status: Married    Spouse name: Todd   Number of children: 0   Years of education: 18   Highest education level: Master's degree (e.g., MA, MS, MEng, MEd, MSW, MBA)  Occupational History   Occupation: Retired.   Occupation: retired  Tobacco Use   Smoking status: Never   Smokeless tobacco: Never  Vaping Use   Vaping status: Never Used  Substance and Sexual Activity   Alcohol use: Not Currently    Comment: rarely   Drug use: No   Sexual activity: Yes    Birth control/protection: Post-menopausal  Other Topics Concern   Not on file  Social History Narrative   Lives with her husband. They help take care of her brother's grandchildren. She enjoys singing in the choir and gardening.   Social Drivers of Health   Tobacco Use: Low Risk (12/25/2024)   Patient History    Smoking Tobacco Use: Never    Smokeless  Tobacco Use: Never    Passive Exposure: Not on file  Financial Resource Strain: Low Risk (12/28/2022)   Overall Financial Resource Strain (CARDIA)    Difficulty of Paying Living Expenses: Not hard at all  Food Insecurity: No Food Insecurity (11/10/2023)   Hunger Vital Sign    Worried About Running Out of Food in the Last Year: Never true    Ran Out of Food in the Last Year: Never true  Transportation Needs: No Transportation Needs (11/10/2023)   PRAPARE - Administrator, Civil Service (Medical): No    Lack of Transportation (Non-Medical): No  Physical Activity: Inactive (12/28/2022)   Exercise Vital Sign    Days of Exercise per Week: 0 days    Minutes of Exercise per Session: 0 min  Stress: No Stress Concern Present (12/28/2022)   [employer] of  Occupational Health - Occupational Stress Questionnaire    Feeling of Stress : Not at all  Social Connections: Socially Integrated (12/28/2022)   Social Connection and Isolation Panel    Frequency of Communication with Friends and Family: More than three times a week    Frequency of Social Gatherings with Friends and Family: More than three times a week    Attends Religious Services: More than 4 times per year    Active Member of Clubs or Organizations: Yes    Attends Banker Meetings: More than 4 times per year    Marital Status: Married  Depression (PHQ2-9): Medium Risk (08/31/2024)   Depression (PHQ2-9)    PHQ-2 Score: 7  Alcohol Screen: Low Risk (12/28/2022)   Alcohol Screen    Last Alcohol Screening Score (AUDIT): 1  Housing: Low Risk (11/10/2023)   Housing    Last Housing Risk Score: 0  Utilities: Not At Risk (11/10/2023)   AHC Utilities    Threatened with loss of utilities: No  Health Literacy: Not on file    Family History  Problem Relation Age of Onset   Cancer Mother        bone   Hypertension Father    Diabetes Father    Stroke Father    Hypertension Sister    Hypertension Sister    Alzheimer's disease Brother    Colon cancer Neg Hx    Esophageal cancer Neg Hx    Rectal cancer Neg Hx    Stomach cancer Neg Hx     Past Medical History:  Diagnosis Date   Arthritis    Cancer (HCC)    breast   Depression    GERD (gastroesophageal reflux disease)    Hyperlipidemia    Tendonitis     Past Surgical History:  Procedure Laterality Date   BREAST SURGERY  2000   right   ELBOW SURGERY     right     Medications Ordered Prior to Encounter[1]  Allergies[2]   DIAGNOSTIC DATA (LABS, IMAGING, TESTING) - I reviewed patient records, labs, notes, testing and imaging myself where available.  Lab Results  Component Value Date   WBC 6.1 11/17/2024   HGB 12.6 11/17/2024   HCT 37.0 11/17/2024   MCV 86.9 11/17/2024   PLT 231 11/17/2024       Component Value Date/Time   NA 144 11/17/2024 1020   NA 142 01/03/2024 1457   K 3.9 11/17/2024 1020   CL 105 11/17/2024 1020   CO2 23 11/17/2024 0941   GLUCOSE 93 11/17/2024 1020   BUN 6 (L) 11/17/2024 1020   BUN 5 (L) 01/03/2024 1457  CREATININE 0.90 11/17/2024 1020   CREATININE 1.02 12/16/2022 1153   CALCIUM 8.9 11/17/2024 0941   PROT 6.6 11/17/2024 0941   PROT 6.4 01/03/2024 1457   ALBUMIN 3.6 11/17/2024 0941   ALBUMIN 3.9 01/03/2024 1457   AST 18 11/17/2024 0941   ALT 13 11/17/2024 0941   ALKPHOS 83 11/17/2024 0941   BILITOT 0.5 11/17/2024 0941   BILITOT 0.3 01/03/2024 1457   GFRNONAA >60 11/17/2024 0941   GFRNONAA 65 11/22/2020 1116   GFRAA 76 11/22/2020 1116   Lab Results  Component Value Date   CHOL 198 12/23/2023   HDL 61 12/23/2023   LDLCALC 122 (H) 12/23/2023   LDLDIRECT 126.4 12/18/2013   TRIG 82 12/23/2023   CHOLHDL 2.4 12/16/2022   Lab Results  Component Value Date   HGBA1C 5.7 (H) 08/10/2022   Lab Results  Component Value Date   VITAMINB12 497 05/30/2024   Lab Results  Component Value Date   TSH 1.170 12/23/2023    PHYSICAL EXAM:  Vitals:   12/25/24 0834  BP: 113/65  Pulse: 80   No data found. Body mass index is 20.74 kg/m.   Wt Readings from Last 3 Encounters:  12/25/24 132 lb 6.4 oz (60.1 kg)  12/21/24 133 lb (60.3 kg)  12/20/24 133 lb (60.3 kg)     Ht Readings from Last 3 Encounters:  12/25/24 5' 7 (1.702 m)  12/21/24 5' 7 (1.702 m)  12/20/24 5' 7 (1.702 m)      General: The patient is awake, alert and appears not in acute distress and groomed. Head: Normocephalic, atraumatic.  Neck is supple. Cardiovascular:  Regular rate and cardiac rhythm by pulse, without distended neck veins. Respiratory: no shortness of breath  Skin:  Without evidence of ankle edema, or rash. Weight loss noted.     NEUROLOGIC EXAM: The patient is awake and alert, oriented to place and time.   Memory subjective described as intact.  Attention  span & concentration ability appears normal.   Speech is fluent,  without  dysarthria, dysphonia or aphasia.  Mood and affect are appropriate.   Neurological Examination: Mental Status: Intact.  Language and speech are normal.  No dysphagia and no aphasia.      12/25/2024    8:37 AM 08/18/2024   11:29 AM 04/20/2024    8:50 AM 02/10/2023    7:30 AM 08/10/2022    8:14 AM  MMSE - Mini Mental State Exam  Orientation to time 4 5 2 5 5   Orientation to Place 4 5 5 5 5   Registration 3 3 3 3 3   Attention/ Calculation 3 2 4 3 4   Recall 1 0 0 2 3  Language- name 2 objects 2 2 2 2 2   Language- repeat 1 1 1 1 1   Language- follow 3 step command 2 3 3 3 3   Language- read & follow direction 1 1 1 1 1   Write a sentence 1 1 1 1 1   Copy design 1 1 1 1 1   Total score 23 24 23 27 29         05/26/2024   11:47 AM  Montreal Cognitive Assessment   Visuospatial/ Executive (0/5) 3  Naming (0/3) 3  Attention: Read list of digits (0/2) 2  Attention: Read list of letters (0/1) 0  Attention: Serial 7 subtraction starting at 100 (0/3) 2  Language: Repeat phrase (0/2) 0  Language : Fluency (0/1) 1  Abstraction (0/2) 1  Delayed Recall (0/5) 0  Orientation (0/6)  6  Total 18     Cranial Nerves II-XII: Intact. Pupils have a slight size difference. Left is larger by 1-2 mm, but both reactive to light.  EOMI. VFF. No facial droop.  No ptosis.  Hearing is grossly intact bilaterally.  The tongue is normal and midline. Motor: Strengths are 5/5 throughout.  Muscle bulk and tone are normal. No tremors.  Coordination: No ataxia or dysmetria.   Sensory: Grossly intact throughout to all modalities. Reflexes: Normal and symmetric throughout. No ankle clonus.  Babinski's sign is absent bilaterally.  Gait and Station: Normal. Romberg's sign is absent.   ASSESSMENT AND PLAN :   74 y.o. year old female  here with:  Alzheimer's disease .   Remote history of migraine.  Not having had to take imitrex  in  many  months.    1) Alzheimer's disease on Lequembi,   normal MRI findings, unchanged, no evidence of ARIA ( no microbleeds in amyloid patients) . Should continue infusion therapy.   Started Lequembi :  08-18-2024.   Presenting with stable MMSE 23/30,  mild  to moderate impairment of memory.   Sleeping well, but has low appetite- no nausea,just little desire to eat, needs reminders to eat.   No more night activity, no recent confusion.   2)  Weight  is now 132 pounds. Used to be 150 and felt good  at 150.  Mirtazapine  should have induced some appetite,   3) encouraged again daily exercise, regular meals, reading and participation in social activity- choir and church, 6 AM services.    I would like to thank Alvia Bring, DO for allowing me to meet with this pleasant patient.    The patient will be seen in follow-up in the memory clinic at [facility] for discussion of test results, sleep related symptoms and treatment compliance review, further management strategies, etc.   The referring provider will be notified of the test results.   The patient's condition requires frequent monitoring and adjustments in the treatment plan, reflecting the ongoing complexity of care.  This provider is the continuing focal point for all needed services for this condition.  After spending a total time of 35 minutes face to face and time for  history taking, physical and neurologic examination, review of laboratory studies,  personal review of imaging studies, reports and results of other testing and review of referral information / records as far as provided in visit,   Electronically signed by: Dedra Gores, MD 12/25/2024 9:11 AM  [facility] Board certified by The [employer] of Sleep Medicine and Diplomate of the [employer] of Sleep Medicine. Board certified In Neurology through the ABPN, Fellow of the [employer] of Neurology.       [1]  Current  Outpatient Medications on File Prior to Visit  Medication Sig Dispense Refill   Calcium Carbonate-Vit D-Min (CALCIUM 1200 PO) Take 1 tablet by mouth daily.     clonazePAM  (KLONOPIN ) 1 MG tablet TAKE 1 TABLET BY MOUTH 2 TIMES DAILY AS NEEDED. 60 tablet 1   donepezil  (ARICEPT ) 10 MG tablet Take 1 tablet (10 mg total) by mouth daily. 90 tablet 3   famotidine  (PEPCID ) 20 MG tablet TAKE 1 TABLET BY MOUTH TWICE A DAY 180 tablet 1   Lecanemab -irmb (LEQEMBI ) 200 MG/2ML SOLN Infusion at [facility] 2 mL 9   lovastatin  (MEVACOR ) 20 MG tablet Take 1 tablet (20 mg total) by mouth at bedtime. 90 tablet 3   meloxicam  (MOBIC ) 15  MG tablet Take 1 tablet (15 mg total) by mouth daily. 30 tablet 0   memantine  (NAMENDA ) 5 MG tablet TAKE 1 TABLET BY MOUTH TWICE A DAY 60 tablet 5   mirtazapine  (REMERON ) 7.5 MG tablet Take 1 tablet (7.5 mg total) by mouth at bedtime as needed. Helps with sleep prn 30 tablet 2   Multiple Vitamin (MULTIVITAMIN) tablet Take 1 tablet by mouth daily.     ondansetron  (ZOFRAN -ODT) 8 MG disintegrating tablet Take 1 tablet (8 mg total) by mouth every 8 (eight) hours as needed for nausea. 20 tablet 3   PARoxetine  (PAXIL ) 20 MG tablet TAKE 2 TABLETS (40 MG TOTAL) BY MOUTH DAILY. 180 tablet 3   Rimegepant Sulfate (NURTEC) 75 MG TBDP Take 1 tablet (75 mg total) by mouth daily as needed (migraine). 30 tablet 1   SUMAtriptan  (IMITREX ) 25 MG tablet Take 1 tablet (25 mg total) by mouth every 2 (two) hours as needed for migraine. May repeat in 2 hours if headache persists or recurs. 10 tablet 2   trimethoprim  (TRIMPEX ) 100 MG tablet TAKE 1 TABLET EVERY DAY 90 tablet 3   fluconazole  (DIFLUCAN ) 150 MG tablet Take 1 tab p.o. for vaginal candidiasis, may repeat 1 tab p.o. in 3 days if symptoms are not resolved. (Patient not taking: Reported on 12/20/2024) 5 tablet 0   No current facility-administered medications on file prior to visit.  [2] No Known Allergies  "

## 2024-12-25 NOTE — Patient Instructions (Signed)
 Lecanemab  Injection What is this medication? LECANEMAB  (lek AN e mab) treats Alzheimer disease. It works by decreasing the buildup of amyloid, a protein that may cause Alzheimer disease. This may slow down the worsening of symptoms. It is a monoclonal antibody. This medicine may be used for other purposes; ask your health care provider or pharmacist if you have questions. COMMON BRAND NAME(S): LEQEMBI  What should I tell my care team before I take this medication? They need to know if you have any of these conditions: An unusual or allergic reaction to lecanemab , other medications, foods, dyes, or preservatives Take medications that treat or prevent blood clots Pregnant or trying to get pregnant Breast-feeding How should I use this medication? This medication is injected into a vein. It is given by your care team in a hospital or clinic setting. A special MedGuide will be given to you before each treatment. Be sure to read this information carefully each time. Talk to your care team about the use of this medication in children. Special care may be needed. Overdosage: If you think you have taken too much of this medicine contact a poison control center or emergency room at once. NOTE: This medicine is only for you. Do not share this medicine with others. What if I miss a dose? Keep appointments for follow-up doses. It is important not to miss your dose. Call your care team if you are unable to keep an appointment. What may interact with this medication? Interactions have not been studied. This list may not describe all possible interactions. Give your health care provider a list of all the medicines, herbs, non-prescription drugs, or dietary supplements you use. Also tell them if you smoke, drink alcohol, or use illegal drugs. Some items may interact with your medicine. What should I watch for while using this medication? Visit your care team for regular checks on your progress. Tell your care  team if your symptoms do not start to get better or if they get worse. What side effects may I notice from receiving this medication? Side effects that you should report to your care team as soon as possible: Allergic reactions or angioedema--skin rash, itching or hives, swelling of the face, eyes, lips, tongue, arms, or legs, trouble swallowing or breathing Headache, worsening confusion, dizziness, change in vision, nausea, seizures Infusion reactions--chest pain, shortness of breath or trouble breathing, feeling faint or lightheaded Side effects that usually do not require medical attention (report these to your care team if they continue or are bothersome): Cough Diarrhea Headache This list may not describe all possible side effects. Call your doctor for medical advice about side effects. You may report side effects to FDA at 1-800-FDA-1088. Where should I keep my medication? This medication is given in a hospital or clinic. It will not be stored at home. NOTE: This sheet is a summary. It may not cover all possible information. If you have questions about this medicine, talk to your doctor, pharmacist, or health care provider.  2024 Elsevier/Gold Standard (2023-11-12 00:00:00)

## 2025-01-02 ENCOUNTER — Ambulatory Visit: Admitting: Family Medicine

## 2025-01-08 ENCOUNTER — Telehealth: Payer: Self-pay | Admitting: Neurology

## 2025-01-08 ENCOUNTER — Ambulatory Visit

## 2025-01-08 DIAGNOSIS — G3184 Mild cognitive impairment, so stated: Secondary | ICD-10-CM

## 2025-01-08 DIAGNOSIS — F0280 Dementia in other diseases classified elsewhere without behavioral disturbance: Secondary | ICD-10-CM

## 2025-01-08 DIAGNOSIS — G319 Degenerative disease of nervous system, unspecified: Secondary | ICD-10-CM

## 2025-01-08 MED ORDER — MIRTAZAPINE 7.5 MG PO TABS: 7.5000 mg | ORAL_TABLET | Freq: Every evening | ORAL | 3 refills | Status: AC | PRN

## 2025-01-08 MED ORDER — MEMANTINE HCL 10 MG PO TABS: 10.0000 mg | ORAL_TABLET | Freq: Two times a day (BID) | ORAL | 3 refills | Status: AC

## 2025-01-08 NOTE — Telephone Encounter (Signed)
 You have done well on Namenda  5 mg twice a day and the next following prescription from 1-28 onward will be for  Namenda  ( MEMANTINE  ) 10 mg bid by mouth.  I will text you and Todd about  this change too.   PS  If you have still a supply of 5 mg tabs you can double the dose to 10 mg twice a day.   CD

## 2025-01-08 NOTE — Telephone Encounter (Signed)
 Happy Birthday,  Yes , your  Lequembi infusions continue based on normal brain MRI results  ( no ARIA ) and tolerance of the medication.  As we discussed on the 12-25-24 meeting, you had experienced a spell of low appetite and inner unrest but neither you not Todd correlated this to the infusion therapy with anti-amyloid medication.   I will make sure that the next infusion appointment is given to you and Don by phone message and  is noted in [facility] as well. Cc  to Dr Alvia.  Stay warm ,     Dedra Gores, MD

## 2025-01-10 NOTE — Telephone Encounter (Signed)
 Per Cindy [name] intrafusion, pt needs new MRI, as soon as MRI is completed patient can be scheduled. I have ordered MRI, Metta can patient be scheduled ASAP, please and thank you?

## 2025-01-10 NOTE — Addendum Note (Signed)
 Addended by: ROBBERT GOSLING D on: 01/10/2025 04:47 PM   Modules accepted: Orders

## 2025-01-11 ENCOUNTER — Other Ambulatory Visit (INDEPENDENT_AMBULATORY_CARE_PROVIDER_SITE_OTHER): Payer: Self-pay | Admitting: Family Medicine

## 2025-01-11 ENCOUNTER — Other Ambulatory Visit: Payer: Self-pay | Admitting: Neurology

## 2025-01-11 DIAGNOSIS — R350 Frequency of micturition: Secondary | ICD-10-CM

## 2025-01-11 LAB — POCT URINALYSIS DIP (CLINITEK)
Bilirubin, UA: NEGATIVE
Glucose, UA: NEGATIVE mg/dL
Nitrite, UA: NEGATIVE
POC PROTEIN,UA: 100 — AB
Spec Grav, UA: >=1.03 — AB
Urobilinogen, UA: 0.2 U/dL
pH, UA: 6

## 2025-01-11 NOTE — Telephone Encounter (Signed)
 no auth required sent to [facility] [phone]

## 2025-01-11 NOTE — Progress Notes (Signed)
 Patient presented to front desk today with c/o UTI symptoms including dysuria and frequency.  Denies fever, chills or flank pain.  UA and culture sent.

## 2025-01-11 NOTE — Telephone Encounter (Signed)
 New MRI needed to resume Lequembi.

## 2025-01-11 NOTE — Addendum Note (Signed)
 Addended by: FRANCHOT ARTA PEDLAR on: 01/11/2025 03:45 PM   Modules accepted: Orders

## 2025-01-12 ENCOUNTER — Ambulatory Visit (HOSPITAL_COMMUNITY)
Admission: RE | Admit: 2025-01-12 | Discharge: 2025-01-12 | Disposition: A | Source: Ambulatory Visit | Attending: Neurology | Admitting: Neurology

## 2025-01-12 ENCOUNTER — Other Ambulatory Visit: Payer: Self-pay | Admitting: Medical-Surgical

## 2025-01-12 ENCOUNTER — Ambulatory Visit: Payer: Self-pay | Admitting: Family Medicine

## 2025-01-12 ENCOUNTER — Other Ambulatory Visit: Payer: Self-pay | Admitting: Family Medicine

## 2025-01-12 DIAGNOSIS — G309 Alzheimer's disease, unspecified: Secondary | ICD-10-CM | POA: Insufficient documentation

## 2025-01-12 DIAGNOSIS — F0280 Dementia in other diseases classified elsewhere without behavioral disturbance: Secondary | ICD-10-CM | POA: Diagnosis present

## 2025-01-12 DIAGNOSIS — G3184 Mild cognitive impairment, so stated: Secondary | ICD-10-CM | POA: Diagnosis present

## 2025-01-12 DIAGNOSIS — Z79899 Other long term (current) drug therapy: Secondary | ICD-10-CM

## 2025-01-12 DIAGNOSIS — G319 Degenerative disease of nervous system, unspecified: Secondary | ICD-10-CM | POA: Insufficient documentation

## 2025-01-12 MED ORDER — NITROFURANTOIN MONOHYD MACRO 100 MG PO CAPS: 100.0000 mg | ORAL_CAPSULE | Freq: Two times a day (BID) | ORAL | 0 refills | Status: AC

## 2025-01-12 MED ORDER — GADOBUTROL 1 MMOL/ML IV SOLN
6.0000 mL | Freq: Once | INTRAVENOUS | Status: AC | PRN
  Administered 2025-01-12: 6 mL via INTRAVENOUS

## 2025-01-15 ENCOUNTER — Ambulatory Visit: Payer: Self-pay | Admitting: Neurology

## 2025-01-16 ENCOUNTER — Ambulatory Visit: Admitting: Family Medicine

## 2025-01-17 LAB — URINE CULTURE

## 2025-02-01 ENCOUNTER — Ambulatory Visit

## 2025-05-24 ENCOUNTER — Ambulatory Visit: Admitting: Neurology
